# Patient Record
Sex: Female | Born: 1970
Health system: Southern US, Community
[De-identification: ages and names within clinical notes are randomized; demographics above are authoritative.]

## PROBLEM LIST (undated history)

## (undated) DIAGNOSIS — E785 Hyperlipidemia, unspecified: Secondary | ICD-10-CM

## (undated) DIAGNOSIS — M1990 Unspecified osteoarthritis, unspecified site: Secondary | ICD-10-CM

## (undated) DIAGNOSIS — I10 Essential (primary) hypertension: Principal | ICD-10-CM

## (undated) DIAGNOSIS — K219 Gastro-esophageal reflux disease without esophagitis: Secondary | ICD-10-CM

## (undated) DIAGNOSIS — D219 Benign neoplasm of connective and other soft tissue, unspecified: Secondary | ICD-10-CM

## (undated) DIAGNOSIS — N946 Dysmenorrhea, unspecified: Secondary | ICD-10-CM

## (undated) HISTORY — DX: Essential (primary) hypertension: I10

## (undated) HISTORY — DX: Benign neoplasm of connective and other soft tissue, unspecified: D21.9

## (undated) HISTORY — DX: Dysmenorrhea, unspecified: N94.6

## (undated) HISTORY — PX: MOUTH SURGERY: SHX715

## (undated) HISTORY — PX: NO PAST SURGERIES: SHX2092

## (undated) HISTORY — DX: Hyperlipidemia, unspecified: E78.5

---

## 2002-12-12 ENCOUNTER — Other Ambulatory Visit: Admission: RE | Admit: 2002-12-12 | Discharge: 2002-12-12 | Payer: Self-pay | Admitting: Internal Medicine

## 2003-12-27 ENCOUNTER — Other Ambulatory Visit: Admission: RE | Admit: 2003-12-27 | Discharge: 2003-12-27 | Payer: Self-pay | Admitting: Obstetrics and Gynecology

## 2005-04-13 ENCOUNTER — Ambulatory Visit: Payer: Self-pay | Admitting: Internal Medicine

## 2005-04-20 ENCOUNTER — Ambulatory Visit: Payer: Self-pay | Admitting: Internal Medicine

## 2005-04-20 ENCOUNTER — Other Ambulatory Visit: Admission: RE | Admit: 2005-04-20 | Discharge: 2005-04-20 | Payer: Self-pay | Admitting: Internal Medicine

## 2006-05-10 ENCOUNTER — Ambulatory Visit: Payer: Self-pay | Admitting: Internal Medicine

## 2006-08-09 ENCOUNTER — Ambulatory Visit: Payer: Self-pay | Admitting: Internal Medicine

## 2006-08-16 ENCOUNTER — Ambulatory Visit: Payer: Self-pay | Admitting: Internal Medicine

## 2006-08-16 ENCOUNTER — Other Ambulatory Visit: Admission: RE | Admit: 2006-08-16 | Discharge: 2006-08-16 | Payer: Self-pay | Admitting: Internal Medicine

## 2006-09-20 ENCOUNTER — Ambulatory Visit: Payer: Self-pay | Admitting: Internal Medicine

## 2007-05-03 ENCOUNTER — Inpatient Hospital Stay (HOSPITAL_COMMUNITY): Admission: AD | Admit: 2007-05-03 | Discharge: 2007-05-05 | Payer: Self-pay | Admitting: Obstetrics and Gynecology

## 2007-05-06 ENCOUNTER — Encounter: Admission: RE | Admit: 2007-05-06 | Discharge: 2007-06-05 | Payer: Self-pay | Admitting: Obstetrics and Gynecology

## 2007-06-06 ENCOUNTER — Encounter: Admission: RE | Admit: 2007-06-06 | Discharge: 2007-07-05 | Payer: Self-pay | Admitting: Obstetrics and Gynecology

## 2007-07-06 ENCOUNTER — Encounter: Admission: RE | Admit: 2007-07-06 | Discharge: 2007-08-05 | Payer: Self-pay | Admitting: Obstetrics and Gynecology

## 2007-08-06 ENCOUNTER — Encounter: Admission: RE | Admit: 2007-08-06 | Discharge: 2007-09-05 | Payer: Self-pay | Admitting: Obstetrics and Gynecology

## 2007-09-06 ENCOUNTER — Encounter: Admission: RE | Admit: 2007-09-06 | Discharge: 2007-10-05 | Payer: Self-pay | Admitting: Obstetrics and Gynecology

## 2007-10-06 ENCOUNTER — Encounter: Admission: RE | Admit: 2007-10-06 | Discharge: 2007-10-24 | Payer: Self-pay | Admitting: Obstetrics and Gynecology

## 2008-05-01 ENCOUNTER — Ambulatory Visit: Payer: Self-pay | Admitting: Internal Medicine

## 2008-05-01 DIAGNOSIS — R51 Headache: Secondary | ICD-10-CM

## 2008-05-01 DIAGNOSIS — E785 Hyperlipidemia, unspecified: Secondary | ICD-10-CM

## 2008-05-01 DIAGNOSIS — R519 Headache, unspecified: Secondary | ICD-10-CM | POA: Insufficient documentation

## 2008-05-01 DIAGNOSIS — G43909 Migraine, unspecified, not intractable, without status migrainosus: Secondary | ICD-10-CM | POA: Insufficient documentation

## 2008-05-01 DIAGNOSIS — S335XXA Sprain of ligaments of lumbar spine, initial encounter: Secondary | ICD-10-CM

## 2008-05-01 LAB — CONVERTED CEMR LAB
Nitrite: NEGATIVE
Specific Gravity, Urine: <1.005
WBC Urine, dipstick: NEGATIVE

## 2008-05-24 ENCOUNTER — Telehealth: Payer: Self-pay | Admitting: *Deleted

## 2008-05-27 ENCOUNTER — Telehealth: Payer: Self-pay | Admitting: *Deleted

## 2008-06-07 ENCOUNTER — Telehealth: Payer: Self-pay | Admitting: Internal Medicine

## 2008-09-20 ENCOUNTER — Telehealth: Payer: Self-pay | Admitting: Internal Medicine

## 2008-10-09 ENCOUNTER — Ambulatory Visit: Payer: Self-pay | Admitting: Internal Medicine

## 2008-10-09 LAB — CONVERTED CEMR LAB
ALT: 27 units/L (ref 0–35)
AST: 21 units/L (ref 0–37)
Alkaline Phosphatase: 93 units/L (ref 39–117)
Bilirubin Urine: NEGATIVE
Bilirubin, Direct: 0.1 mg/dL (ref 0.0–0.3)
CO2: 30 meq/L (ref 19–32)
Chloride: 106 meq/L (ref 96–112)
Glucose, Bld: 91 mg/dL (ref 70–99)
Hemoglobin: 12.9 g/dL (ref 12.0–15.0)
Leukocytes, UA: NEGATIVE
Lymphocytes Relative: 46 % (ref 12.0–46.0)
Monocytes Relative: 6.8 % (ref 3.0–12.0)
Neutro Abs: 2.5 10*3/uL (ref 1.4–7.7)
Nitrite: NEGATIVE
Platelets: 286 10*3/uL (ref 150–400)
Potassium: 3.9 meq/L (ref 3.5–5.1)
RDW: 13.6 % (ref 11.5–14.6)
Sodium: 141 meq/L (ref 135–145)
Total CHOL/HDL Ratio: 6
Total Protein: 7.9 g/dL (ref 6.0–8.3)
Triglycerides: 69 mg/dL (ref 0–149)
pH: 7 (ref 5.0–8.0)

## 2008-10-16 ENCOUNTER — Ambulatory Visit: Payer: Self-pay | Admitting: Internal Medicine

## 2008-10-16 ENCOUNTER — Encounter: Payer: Self-pay | Admitting: Internal Medicine

## 2008-10-16 ENCOUNTER — Other Ambulatory Visit: Admission: RE | Admit: 2008-10-16 | Discharge: 2008-10-16 | Payer: Self-pay | Admitting: Internal Medicine

## 2008-10-16 DIAGNOSIS — N912 Amenorrhea, unspecified: Secondary | ICD-10-CM

## 2009-02-11 ENCOUNTER — Ambulatory Visit: Payer: Self-pay | Admitting: Internal Medicine

## 2009-12-19 ENCOUNTER — Ambulatory Visit: Payer: Self-pay | Admitting: Internal Medicine

## 2009-12-19 LAB — CONVERTED CEMR LAB
ALT: 14 units/L (ref 0–35)
Albumin: 3.2 g/dL — ABNORMAL LOW (ref 3.5–5.2)
BUN: 6 mg/dL (ref 6–23)
Chloride: 107 meq/L (ref 96–112)
Cholesterol: 184 mg/dL (ref 0–200)
Eosinophils Relative: 2.1 % (ref 0.0–5.0)
Glucose, Bld: 78 mg/dL (ref 70–99)
HCT: 38.3 % (ref 36.0–46.0)
Hemoglobin: 12.4 g/dL (ref 12.0–15.0)
Ketones, urine, test strip: NEGATIVE
Lymphs Abs: 2.5 10*3/uL (ref 0.7–4.0)
MCV: 87.3 fL (ref 78.0–100.0)
Monocytes Absolute: 0.4 10*3/uL (ref 0.1–1.0)
Neutro Abs: 3 10*3/uL (ref 1.4–7.7)
Nitrite: NEGATIVE
Platelets: 286 10*3/uL (ref 150.0–400.0)
Potassium: 3.8 meq/L (ref 3.5–5.1)
RDW: 13.2 % (ref 11.5–14.6)
TSH: 1.04 microintl units/mL (ref 0.35–5.50)
Total Bilirubin: 0.6 mg/dL (ref 0.3–1.2)
Total Protein: 7.7 g/dL (ref 6.0–8.3)
Urobilinogen, UA: 0.2
VLDL: 22.6 mg/dL (ref 0.0–40.0)
WBC: 6.2 10*3/uL (ref 4.5–10.5)

## 2010-03-02 ENCOUNTER — Ambulatory Visit: Payer: Self-pay | Admitting: Internal Medicine

## 2010-03-02 ENCOUNTER — Other Ambulatory Visit: Admission: RE | Admit: 2010-03-02 | Discharge: 2010-03-02 | Payer: Self-pay | Admitting: Internal Medicine

## 2010-03-02 DIAGNOSIS — R030 Elevated blood-pressure reading, without diagnosis of hypertension: Secondary | ICD-10-CM

## 2010-03-02 LAB — CONVERTED CEMR LAB
Bilirubin Urine: NEGATIVE
Glucose, Urine, Semiquant: NEGATIVE
Ketones, urine, test strip: NEGATIVE
Protein, U semiquant: NEGATIVE
Urobilinogen, UA: 0.2
pH: 8

## 2010-03-09 LAB — CONVERTED CEMR LAB: Pap Smear: NEGATIVE

## 2010-04-27 ENCOUNTER — Ambulatory Visit: Payer: Self-pay | Admitting: Internal Medicine

## 2010-09-15 ENCOUNTER — Ambulatory Visit: Payer: Self-pay | Admitting: Internal Medicine

## 2010-09-18 ENCOUNTER — Telehealth: Payer: Self-pay | Admitting: Internal Medicine

## 2010-12-22 ENCOUNTER — Encounter
Admission: RE | Admit: 2010-12-22 | Discharge: 2010-12-22 | Payer: Self-pay | Source: Home / Self Care | Attending: Obstetrics and Gynecology | Admitting: Obstetrics and Gynecology

## 2010-12-29 ENCOUNTER — Telehealth: Payer: Self-pay | Admitting: *Deleted

## 2011-01-12 NOTE — Assessment & Plan Note (Signed)
Summary: 2 MONTH FUP//CCM   Vital Signs:  Patient profile:   40 year old female Menstrual status:  regular Height:      64.5 inches Weight:      194 pounds BMI:     32.90 Temp:     98.4 degrees F oral Pulse rate:   74 / minute BP sitting:   152 / 90  (right arm)  Vitals Entered By: Kathrynn Speed CMA (Apr 27, 2010 11:25 AM) CC: ROV/ BP, Hypertension Management   History of Present Illness: Teresa Hickman comes in comes in today  for follow up of BP and HAs. Since last time her BP has been up and down  Max 148/90 low 133/82  No cv signs . HAs  calendare for past month  shows about 3 days around eginning of menses.   and moderated  with some releif with nsaid . Further hx of HA characterization she has had hx of  migraine with aura and classic migraine . DOing ok today.    Hypertension History:      She complains of chest pain, but denies headache, palpitations, dyspnea with exertion, orthopnea, PND, peripheral edema, visual symptoms, neurologic problems, syncope, and side effects from treatment.  She notes no problems with any antihypertensive medication side effects.        Positive major cardiovascular risk factors include hyperlipidemia.  Negative major cardiovascular risk factors include female age less than 2 years old and non-tobacco-user status.     Preventive Screening-Counseling & Management  Alcohol-Tobacco     Alcohol drinks/day: 0     Smoking Status: never  Caffeine-Diet-Exercise     Caffeine use/day: 1     Does Patient Exercise: yes  Current Medications (verified): 1)  Nortrel 0.5/35 (28) 0.5-35 Mg-Mcg Tabs (Norethindrone-Eth Estradiol) .Marland Kitchen.. 1 By Mouth Once Daily  Allergies (verified): No Known Drug Allergies  Past History:  Past medical, surgical, family and social histories (including risk factors) reviewed for relevance to current acute and chronic problems.  Past Medical History: Reviewed history from 02/11/2009 and no changes  required. g4P4 Headache Hyperlipidemia       LAST Mammogram: n/a Pap: 2007 Td: 08/16/06  Consults   None  Past Surgical History: Reviewed history from 05/01/2008 and no changes required. G3 P3  Past History:  Care Management: None Current  Family History: Reviewed history from 03/02/2010 and no changes required. Family History Hypertension mom  died    in [place].  6os  SD.  2010 Family History of Stroke F 1st degree relativemom Father: well Mother:  type 2 dm   Siblings:   Social History: Reviewed history from 03/02/2010 and no changes required. non smoker  from [place] medical family HH of 6  no pets        Review of Systems       no cv pulm signs  no vision changes  Physical Exam  General:  Well-developed,well-nourished,in no acute distress; alert,appropriate and cooperative throughout examination Head:  normocephalic and atraumatic.   Lungs:  normal respiratory effort and no intercostal retractions.   Heart:  normal rate and regular rhythm.   Neurologic:  grossly non focal  Psych:  Oriented X3, normally interactive, good eye contact, not anxious appearing, and not depressed appearing.   Additional Exam:  reviewed records log BP and HA    Impression & Recommendations:  Problem # 1:  ELEVATED BLOOD PRESSURE WITHOUT DIAGNOSIS OF HYPERTENSION (ICD-796.2) about 50 %  of readings have some elevation mild ...doesnt seem  related to HA but  is on combined OCPS  and this can contribute to effect.  NO meds for now but will need to stop the ocps.  and then follow up   Problem # 2:  MIGRAINE HEADACHE (ICD-346.90) Hx of classic  migarine with aura .   HA seem to be menstrually    related .    Discussed risk benefit     of ocp and rec  DC combined pills  because of increase risk of stroke  with this hx.    age and bp issues .  Pt  would consider an IUD  and I think this is a good option  .    Will arrange her own appt and will send note to this person.   Problem # 3:   CONTRACEPTIVE MANAGEMENT (ICD-V25.09) see above    rec dc ocps.   Complete Medication List: 1)  Nortrel 0.5/35 (28) 0.5-35 Mg-mcg Tabs (Norethindrone-eth estradiol) .Marland Kitchen.. 1 by mouth once daily  Hypertension Assessment/Plan:      The patient's hypertensive risk group is category B: At least one risk factor (excluding diabetes) with no target organ damage.  Her calculated 10 year risk of coronary heart disease is 3 %.  Today's blood pressure is 152/90.    Patient Instructions: 1)  call   with  appt    for poss IUD placement .    2)  Going off  combined oral contraceptives because of  elevated Blood pressure readings and hx of some aura with Headaches. 3)  Continue to track your HAs  and BPressure readings     4)  (no meds for ) 5)  After stopping   medication   return office visit after 1-2 months so we can review your BP readings and HA calendar . She will call if BP staying high   in the meantime.

## 2011-01-12 NOTE — Progress Notes (Signed)
Summary: Pt needs IUD at Dr. Kittie Plater office.  ---- Converted from flag ---- ---- 09/16/2010 11:48 AM, Madelin Headings MD wrote: please call or have someone call Dr Thurmon Fair office  and  arrange for her to be seen  for poss mirena iud  . she shouldnt be taking  combined ocps with her HA issues   but i put her on progersterone only pill temporarily . this got confused last time .  fax went to dr Vincente Poli instead of dr Henderson Cloud.  thanks ------------------------------   I called Dr. Kittie Plater office to set up an appt for pt to get a IUD. They are going to have to call me back about this. Romualdo Bolk, CMA (AAMA)  September 18, 2010 10:12 AM [phone] at Dr. Cline Cools to [place] about this. She states that she will call pt to schedule an appt. I have call pt and discussed this with her. Romualdo Bolk, CMA (AAMA)  September 18, 2010 11:57 AM thank you Madelin Headings MD  September 18, 2010 1:20 PM

## 2011-01-12 NOTE — Assessment & Plan Note (Signed)
Summary: cpx/? pap/njr per shannon/njr   Vital Signs:  Patient profile:   40 year old female Menstrual status:  regular LMP:     02/27/2010 Height:      64.5 inches Weight:      193 pounds BMI:     32.73 Pulse rate:   78 / minute BP sitting:   140 / 84  (left arm) Cuff size:   regular  Vitals Entered By: Romualdo Bolk, CMA Duncan Dull) (March 02, 2010 10:17 AM)  Nutrition Counseling: Patient's BMI is greater than 25 and therefore counseled on weight management options. CC: CPX discuss doing a pap LMP (date): 02/27/2010 LMP - Character: normal Menarche (age onset years): 14   Menses interval (days): 28 Menstrual flow (days): 3-4 Menstrual Status regular Enter LMP: 02/27/2010 Last PAP Result NEGATIVE FOR INTRAEPITHELIAL LESIONS OR MALIGNANCY.   History of Present Illness: Teresa Hickman comesin  today  for preventive visit .  Gets migraines.     taking ibuprofen.   somewhat more frequently recently . NO vision changes fever numbness or weakness.  Unclear how often  needs to take meds.  OCPS doing well for her  periods.   No other new med problems . No BP checks done.  Some increasing weight over the year.   Preventive Care Screening  Last Tetanus Booster:    Date:  08/16/2006    Results:  Tdap   Prior Values:    Pap Smear:  NEGATIVE FOR INTRAEPITHELIAL LESIONS OR MALIGNANCY. (10/16/2008)   Preventive Screening-Counseling & Management  Alcohol-Tobacco     Alcohol drinks/day: 0     Smoking Status: never  Caffeine-Diet-Exercise     Caffeine use/day: 1     Does Patient Exercise: yes  Hep-HIV-STD-Contraception     Dental Visit-last 6 months yes  Safety-Violence-Falls     Seat Belt Use: yes     Firearms in the Home: no firearms in the home     Smoke Detectors: yes      Blood Transfusions:  no.        Travel History:  [place].    Current Medications (verified): 1)  Nortrel 0.5/35 (28) 0.5-35 Mg-Mcg Tabs (Norethindrone-Eth Estradiol) .Marland Kitchen.. 1 By Mouth Once  Daily  Allergies (verified): No Known Drug Allergies  Past History:  Past medical, surgical, family and social histories (including risk factors) reviewed, and no changes noted (except as noted below).  Past Medical History: Reviewed history from 02/11/2009 and no changes required. g4P4 Headache Hyperlipidemia       LAST Mammogram: n/a Pap: 2007 Td: 08/16/06  Consults   None  Past Surgical History: Reviewed history from 05/01/2008 and no changes required. G3 P3  Past History:  Care Management: None Current  Family History: Reviewed history from 10/16/2008 and no changes required. Family History Hypertension mom  died    in [place].  6os  SD.  2010 Family History of Stroke F 1st degree relativemom Father: well Mother:  type 2 dm   Siblings:   Social History: Reviewed history from 02/11/2009 and no changes required. non smoker  from [place] medical family HH of 6  no pets      Smoking Status:  never Caffeine use/day:  1 Does Patient Exercise:  yes Seat Belt Use:  yes Dental Care w/in 6 mos.:  yes Blood Transfusions:  no  Review of Systems       The patient complains of headaches.  The patient denies anorexia, fever, weight loss, weight gain, vision loss, hoarseness, chest  pain, syncope, dyspnea on exertion, peripheral edema, prolonged cough, hemoptysis, abdominal pain, melena, hematochezia, severe indigestion/heartburn, hematuria, muscle weakness, suspicious skin lesions, transient blindness, difficulty walking, unusual weight change, abnormal bleeding, enlarged lymph nodes, angioedema, and breast masses.         left  hearing a problem.  after a cold.  Physical Exam General Appearance: well developed, well nourished, no acute distress Eyes: conjunctiva and lids normal, PERRLA, EOMI, WNL Ears, Nose, Mouth, Throat: TM clear, nares clear, oral exam WNL Neck: supple, no lymphadenopathy, no thyromegaly, no JVD Respiratory: clear to auscultation and percussion,  respiratory effort normal Cardiovascular: regular rate and rhythm, S1-S2, no murmur, rub or gallop, no bruits, peripheral pulses normal and symmetric, no cyanosis, clubbing, edema or varicosities Chest: no scars, masses, tenderness; no asymmetry, skin changes, nipple discharge   Gastrointestinal: soft, non-tender; no hepatosplenomegaly, masses; active bowel sounds all quadrants, guaiac negative stool; no masses, tenderness, hemorrhoids  Genitourinary: no vaginal discharge, lesions; no masses or tenderness Lymphatic: no cervical, axillary or inguinal adenopathy  PAP done Musculoskeletal: gait normal, muscle tone and strength WNL, no joint swelling, effusions, discoloration, crepitus  Skin: clear, good turgor, color WNL, no rashes, lesions, or ulcerations Neurologic: normal mental status, normal reflexes, normal strength, sensation, and motion Psychiatric: alert; oriented to person, place and time Other Exam:     Impression & Recommendations:  Problem # 1:  HEALTH MAINTENANCE EXAM, ADULT (ICD-V70.0) disc strategies for weigh t loss.   Problem # 2:  ROUTINE GYNECOLOGICAL EXAM (ICD-V72.31)  nl exam   Orders: Pap Smear, Thin Prep ( Collection of) ([id number])  Problem # 3:  HEADACHE (ICD-784.0) counseled and consider calendaring to prevent chronic ha and rebound  Problem # 4:  ELEVATED BLOOD PRESSURE WITHOUT DIAGNOSIS OF HYPERTENSION (ICD-796.2) Assessment: New with family hx    should follow  and currently monitor and lifestyle intervention   Problem # 5:  CONTRACEPTIVE MANAGEMENT (ICD-V25.09) monitor with BP and has  had been doing well  .  Complete Medication List: 1)  Nortrel 0.5/35 (28) 0.5-35 Mg-mcg Tabs (Norethindrone-eth estradiol) .Marland Kitchen.. 1 by mouth once daily  Patient Instructions: 1)  Do HA calendar.   over the next 2 months and then ROV. 2)  Check BP readings  at least 3 x per week  and bring results  to office .  and  return office visit .    3)  If you are not having to  take medication for headaches more than 4-5 x per month and your BP readings are below 140/90  you can  mail or fax in your readings and HA calendar  so I can review this . I Prescriptions: NORTREL 0.5/35 (28) 0.5-35 MG-MCG TABS (NORETHINDRONE-ETH ESTRADIOL) 1 by mouth once daily  #28 Tablet x 6   Entered and Authorized by:   Madelin Headings MD   Signed by:   Madelin Headings MD on 03/02/2010   Method used:   Electronically to        [facility] [address] [phone]* (retail)       [address]       [place], [place]  [address]       Ph: [phone] or [phone]       Fax: [phone]   RxID:   [id number]     Laboratory Results   Urine Tests  Date/Time Received: March 02, 2010 10:48 AM   Routine Urinalysis   Color: yellow  Appearance: Clear Glucose: negative   (Normal Range: Negative) Bilirubin: negative   (Normal Range: Negative) Ketone: negative   (Normal Range: Negative) Spec. Gravity: <1.005   (Normal Range: 1.003-1.035) Blood: trace-lysed   (Normal Range: Negative) pH: 8.0   (Normal Range: 5.0-8.0) Protein: negative   (Normal Range: Negative) Urobilinogen: 0.2   (Normal Range: 0-1) Nitrite: negative   (Normal Range: Negative) Leukocyte Esterace: trace   (Normal Range: Negative)    Comments: Romualdo Bolk, CMA (AAMA)  March 02, 2010 10:48 AM

## 2011-01-12 NOTE — Assessment & Plan Note (Signed)
Summary: MIGRAINE HEADACHE/CJR   Vital Signs:  Patient profile:   40 year old female Menstrual status:  regular LMP:     08/17/2010 Weight:      188 pounds Temp:     98.9 degrees F oral Pulse rate:   72 / minute BP sitting:   130 / 80  (left arm) Cuff size:   regular  Vitals Entered By: Romualdo Bolk, CMA (AAMA) (September 15, 2010 9:49 AM) CC: Migraine Ha for 2-3 days. Pt is having some vomiting, senstive to both light and sound. PT states that she had a ha on 10/1 and lasted for 3 days. No ha today. Pt states that pharmacy changed manufactures and she thinks this is what is causing her ha's. Pt has been on the new pack for 2 months. Pt states that the ha started when the pharmacy changed her ocp's. LMP (date): 08/17/2010 LMP - Character: normal Menarche (age onset years): 14   Menses interval (days): 28 Menstrual flow (days): 3-4 Enter LMP: 08/17/2010 Last PAP Result NEGATIVE FOR INTRAEPITHELIAL LESIONS OR MALIGNANCY. BENIGN REACTIVE/REPARATIVE CHANGES.   History of Present Illness: Trace Cederberg   comes in today   for MHAs .   Thinks the ha related to change in generic med and more frequently   for 2 month 2-4 x per month . See last note in June and plan was to  get iud  but  Had gone to her gyne as we directed but they doidnt get her an IUD in the summer and gave her something else apparentlydidnt know about our plan.  In the meamtine   continued with ocps and then genereic changes and now getting   2-3 has per month with some aura and nausea and vomiting doing ok now.   Preventive Screening-Counseling & Management  Alcohol-Tobacco     Alcohol drinks/day: 0     Smoking Status: never  Caffeine-Diet-Exercise     Caffeine use/day: 1     Does Patient Exercise: yes  Current Medications (verified): 1)  Nortrel 0.5/35 (28) 0.5-35 Mg-Mcg Tabs (Norethindrone-Eth Estradiol) .Marland Kitchen.. 1 By Mouth Once Daily  Allergies (verified): No Known Drug Allergies  Past History:  Past  medical, surgical, family and social histories (including risk factors) reviewed, and no changes noted (except as noted below).  Past Medical History: g4P4 Headache  migraine some with aura  Hyperlipidemia       LAST Mammogram: n/a Pap: 2007 Td: 08/16/06  Consults   None  Past Surgical History: Reviewed history from 05/01/2008 and no changes required. G3 P3  Past History:  Care Management: None Current  Family History: Reviewed history from 03/02/2010 and no changes required. Family History Hypertension mom  died    in [place].  6os  SD.  2010 Family History of Stroke F 1st degree relativemom Father: well Mother:  type 2 dm   Siblings:   Social History: Reviewed history from 03/02/2010 and no changes required. non smoker  from [place] medical family HH of 6  no pets        Review of Systems  The patient denies anorexia, fever, weight loss, weight gain, vision loss, decreased hearing, hoarseness, chest pain, syncope, hemoptysis, abdominal pain, muscle weakness, transient blindness, difficulty walking, abnormal bleeding, enlarged lymph nodes, and angioedema.    Physical Exam  General:  Well-developed,well-nourished,in no acute distress; alert,appropriate and cooperative throughout examination Head:  normocephalic and atraumatic.   Eyes:  PERRL, EOMs full, conjunctiva clear  Ears:  R ear  normal, L ear normal, and no external deformities.   Nose:  no external deformity and no nasal discharge.   Mouth:  good dentition and pharynx pink and moist.   Lungs:  Normal respiratory effort, chest expands symmetrically. Lungs are clear to auscultation, no crackles or wheezes. Heart:  Normal rate and regular rhythm. S1 and S2 normal without gallop, murmur, click, rub or other extra sounds. Pulses:  pulses intact without delay   nl cap refill  Neurologic:  alert & oriented X3, cranial nerves IIi-XII intact, strength normal in all extremities, gait normal, and DTRs symmetrical and  normal.   Skin:  turgor normal, color normal, no ecchymoses, and no petechiae.   Cervical Nodes:  No lymphadenopathy noted Psych:  Oriented X3, normally interactive, good eye contact, not anxious appearing, and not depressed appearing.     Impression & Recommendations:  Problem # 1:  MIGRAINE HEADACHE (ICD-346.90) see last note     confusion about referral and didnt get iud  and instead continuing with   combined meds.  bp has been high normal  not elevated like before.,      She shouldnt be on combined pills and  we will contact Dr Thurmon Fair office about  referral  as  previous arrangements  fell through .     Patinet willing to have IUD .   change to minipill in the meantime.   Her updated medication list for this problem includes:    Ibuprofen 800 Mg Tabs (Ibuprofen) .Marland Kitchen... 1 by mouth q6 hour s as needed headache  Problem # 2:  CONTRACEPTIVE MANAGEMENT (ICD-V25.09)  Problem # 3:  ELEVATED BLOOD PRESSURE WITHOUT DIAGNOSIS OF HYPERTENSION (ICD-796.2) better today  but hogih normal   Complete Medication List: 1)  Nortrel 0.5/35 (28) 0.5-35 Mg-mcg Tabs (Norethindrone-eth estradiol) .Marland Kitchen.. 1 by mouth once daily 2)  Ibuprofen 800 Mg Tabs (Ibuprofen) .Marland Kitchen.. 1 by mouth q6 hour s as needed headache 3)  Ortho Micronor 0.35 Mg Tabs (Norethindrone) .Marland Kitchen.. 1 by mouth once daily  Contraindications/Deferment of Procedures/Staging:    Test/Procedure: FLU VAX    Reason for deferment: patient declined   Patient Instructions: 1)  I still think  you should not be taking the combined hormone pill. so stop the current pill.  2)  Change to progesterone only pill for now  3)  we will contact Dr Henderson Cloud office   about   IUD  option.   4)  This should be safer and a good option 5)  Can use  Ibuprofen 800 at onset of ha in the meantime. Prescriptions: ORTHO MICRONOR 0.35 MG TABS (NORETHINDRONE) 1 by mouth once daily  #1 pack x 1   Entered and Authorized by:   Madelin Headings MD   Signed by:   Madelin Headings MD  on 09/15/2010   Method used:   Print then Give to Patient   RxID:   [id number] IBUPROFEN 800 MG TABS (IBUPROFEN) 1 by mouth q6 hour s as needed headache  #40 x 1   Entered and Authorized by:   Madelin Headings MD   Signed by:   Madelin Headings MD on 09/15/2010   Method used:   Print then Give to Patient   RxID:   [id number]

## 2011-01-14 NOTE — Progress Notes (Signed)
Summary: Pt req refills for the new birth control pill she was prescribed  Phone Note Call from Patient Call back at Home Phone [phone]   Summary of Call: Pt called and is needing refill of new birth control med. Pls call in to [facility] on [address]    Pt completely out of med.  Initial call taken by: Lucy Antigua,  December 29, 2010 3:50 PM  Follow-up for Phone Call        Spoke to pt and she has seen the GYN and they are going to put the IUD in next month. I told pt that we would refill it this time then she needs to let us know if she doesn't get the IUD. Follow-up by: Romualdo Bolk, CMA (AAMA),  December 29, 2010 4:14 PM    Prescriptions: ORTHO MICRONOR 0.35 MG TABS (NORETHINDRONE) 1 by mouth once daily  #1 pack x 0   Entered by:   Romualdo Bolk, CMA (AAMA)   Authorized by:   Madelin Headings MD   Signed by:   Romualdo Bolk, CMA (AAMA) on 12/29/2010   Method used:   Electronically to        [facility] [address] [phone]* (retail)       [address]       [place], [place]  [address]       Ph: [phone] or [phone]       Fax: [phone]   RxID:   [id number]

## 2011-01-18 ENCOUNTER — Telehealth: Payer: Self-pay | Admitting: Internal Medicine

## 2011-01-18 NOTE — Telephone Encounter (Signed)
Pt would like ov for headaches asap. Can I work pt in?

## 2011-01-19 ENCOUNTER — Telehealth: Payer: Self-pay | Admitting: *Deleted

## 2011-01-19 MED ORDER — NORETHINDRONE 0.35 MG PO TABS: 1.0000 | ORAL_TABLET | Freq: Every day | ORAL | Status: DC

## 2011-01-19 NOTE — Telephone Encounter (Signed)
Spoke to pt and she is still waiting to get the IUD. I told pt that we would call in her OCP's for 2 more months but then she needs to let us know when she is going to get the IUD.

## 2011-01-20 ENCOUNTER — Ambulatory Visit: Payer: Self-pay | Admitting: Internal Medicine

## 2011-03-05 NOTE — Telephone Encounter (Signed)
Pt had ov sch for 2-8

## 2011-03-17 ENCOUNTER — Encounter: Payer: Self-pay | Admitting: Internal Medicine

## 2011-03-18 ENCOUNTER — Encounter: Payer: Self-pay | Admitting: Internal Medicine

## 2011-03-18 ENCOUNTER — Ambulatory Visit (INDEPENDENT_AMBULATORY_CARE_PROVIDER_SITE_OTHER): Payer: 59 | Admitting: Internal Medicine

## 2011-03-18 VITALS — BP 120/80 | HR 78 | Ht 64.5 in | Wt 194.0 lb

## 2011-03-18 DIAGNOSIS — R030 Elevated blood-pressure reading, without diagnosis of hypertension: Secondary | ICD-10-CM

## 2011-03-18 DIAGNOSIS — N926 Irregular menstruation, unspecified: Secondary | ICD-10-CM | POA: Insufficient documentation

## 2011-03-18 DIAGNOSIS — Z309 Encounter for contraceptive management, unspecified: Secondary | ICD-10-CM | POA: Insufficient documentation

## 2011-03-18 DIAGNOSIS — G43909 Migraine, unspecified, not intractable, without status migrainosus: Secondary | ICD-10-CM

## 2011-03-18 MED ORDER — NORETHINDRONE 0.35 MG PO TABS: 1.0000 | ORAL_TABLET | Freq: Every day | ORAL | Status: DC

## 2011-03-18 NOTE — Assessment & Plan Note (Signed)
Currently in frequent and rare

## 2011-03-18 NOTE — Patient Instructions (Addendum)
Sign release to ge copy of  Your gyne check and pap.   The irregular  menses you are having  Is   Somewhat common with the  Progesterone only pill. IF you take it on time however should be pretty good for contraception. Still consider   Depo provera   And progesterone implants and of course the iud.  The reason we are avoiding the estrogen is because of  your hisitory of classic migraine and loss of vision with your migraines.  Estrogen puts you at risk of stroke in this situation.  You also had elevation of Blood pressure at that time.

## 2011-03-18 NOTE — Progress Notes (Signed)
  Subjective:    Patient ID: Teresa Hickman, female    DOB: 05/07/1971, 40 y.o.   MRN: [id number]  HPI Patient comes in today for appointment to discuss contraception birth control method. She was considering NIDDM did go to the OB/GYN and had a normal Pap and apparently normal exam and mammogram. She is having some irregular bleeding on the progesterone only daily pills but takes them very regularly sometimes she skips a period and this causes her some concern. Home pregnancy test negative sometimes her bleeding is heavy. The GYN told her that this could just be normal for this pill. It does not appear that she needed to have any kind of diagnostic procedure.  She wants to discuss any other options.   Review of Systems Negative for chest pain shortness of breath her migraine headaches her minimal now really gets them her blood pressure has been good.  Past Medical History  Diagnosis Date  . Migraine     with some aura  . Hyperlipidemia    History reviewed. No pertinent past surgical history.  reports that she has never smoked. She does not have any smokeless tobacco history on file. She reports that she does not drink alcohol or use illicit drugs. family history includes Diabetes in her mother; Hypertension in her mother; and Stroke in her mother. No Known Allergies     Objective:   Physical Exam Well-developed well-nourished in no acute distress   record review in the previous electronic record and the current one.       Assessment & Plan:  Irregular bleeding most likely from the progesterone only contraception  Expected management options such as the Depo-Provera or the progesterone implants as opposed to the IUD.  Review the chart and would recommend against estrogen pills because of her visual changes with migraines and the increased risk of stroke. Her mom did have a stroke in her life.   Unfortunately because of her age contraceptives efficacy may be pretty good. She can  consider these other options at some point in the future. We'll have her sign a release to get a copy of the gynecologic evaluation.  Total visit > 50% spent counseling and coordinating care

## 2011-08-23 ENCOUNTER — Encounter: Payer: Self-pay | Admitting: Internal Medicine

## 2011-08-23 ENCOUNTER — Ambulatory Visit (INDEPENDENT_AMBULATORY_CARE_PROVIDER_SITE_OTHER): Payer: 59 | Admitting: Internal Medicine

## 2011-08-23 DIAGNOSIS — R51 Headache: Secondary | ICD-10-CM

## 2011-08-23 DIAGNOSIS — N926 Irregular menstruation, unspecified: Secondary | ICD-10-CM

## 2011-08-23 DIAGNOSIS — R109 Unspecified abdominal pain: Secondary | ICD-10-CM

## 2011-08-23 DIAGNOSIS — G43909 Migraine, unspecified, not intractable, without status migrainosus: Secondary | ICD-10-CM

## 2011-08-23 DIAGNOSIS — R030 Elevated blood-pressure reading, without diagnosis of hypertension: Secondary | ICD-10-CM

## 2011-08-23 DIAGNOSIS — Z309 Encounter for contraceptive management, unspecified: Secondary | ICD-10-CM

## 2011-08-23 LAB — POCT URINALYSIS DIPSTICK
Glucose, UA: NEGATIVE
Ketones, UA: NEGATIVE
Leukocytes, UA: NEGATIVE
Spec Grav, UA: 1.025
Urobilinogen, UA: 0.2

## 2011-08-23 LAB — POCT URINE PREGNANCY: Preg Test, Ur: NEGATIVE

## 2011-08-23 NOTE — Progress Notes (Signed)
  Subjective:    Patient ID: Teresa Hickman, female    DOB: 10/11/1971, 41 y.o.   MRN: [id number]  HPI  Patient comes in today for SDA  For acute problem evaluation. However ongoing  Also. Sh has  Developed  Right low back pain   About   7/10 worse at night.  Sometime pain down legs.  Now has 10 day period.   Ongoing.  Dropping in toilet.    It is possible that back pain related. Just came  Back  from [place]  3 weeks ago .   NO known missed pills but could have been off cause fo travel.   Asks about other options contraception of another pill .   No more sig headaches off combined ocps on progesterone only.    ? What happened about getting the mirena   Communication problem with gyne office.  Review of Systems No fever cough cp sob  Other bleeding  abd pain NVD   Past history family history social history reviewed in the electronic medical record.     Objective:   Physical Exam WDWN in nad  Nl gait  Neck supple  Back    R/  si area pain  Nl gait and no obv gross motor weakness No clubbing cyanosis or edema Neuro oreinted nl station and gait  No obv focal weakness      Assessment & Plan:  LB pain  With hx of same   Sound mechanical but could be related to menses.  Prolonged menses  On  prog only pill  ? If taking late when she traveled internationally. Pt is some with frustrated with situation and I reviewed again options possible  Would like to avoid estrogen cause of one episode of sig visual auro and migraine and also some elevation of bp.  Rik of stroke with estrogen  There is a 10 mcg pill but  Still has a risk and cant guarantee bleeding pattern or se.   Still think the iud a good option.   She will talk with her husband a physician before proceeding.  Total visit > 50% spent counseling and coordinating care

## 2011-08-23 NOTE — Patient Instructions (Signed)
Ibuprofen ok for the  Back  Pain and hot pad.   If the back pain is  persistent or progressive . Call for  Reevaluation.  It is possible that the prolonged period could be from the pills  Taken with some hours of fluctuation.  I am hesitant to have you take estrogen pills because of the increase rsij of stroke in persons who have visual migraines. Other options are MIrena IUD and depoprovera.  There is a low dose Loestrin 10 mg that could be tried with caution  But may not make your periods regular and still has  Increase stroke risk.    Call if you wish to do this instead with health risk caveats as above.

## 2012-05-04 ENCOUNTER — Telehealth: Payer: Self-pay | Admitting: Internal Medicine

## 2012-05-04 NOTE — Telephone Encounter (Signed)
Pls advise.  

## 2012-05-04 NOTE — Telephone Encounter (Signed)
Pt is traveling out of the country and would like to have a physical before June 15th and would like to have  Her daughter Desere Gwin establish and have a physical before June 15th as well. Please advise

## 2012-05-04 NOTE — Telephone Encounter (Signed)
June 11th appt  tues 145  Is a 30 minute for cpx . We can see her daughter same day  85 and 25  but must make sure the first appt  Is not late  And paper work filled out to expedite before the visit for her daughter. Any immunizations  Records are helpful.

## 2012-05-05 NOTE — Telephone Encounter (Signed)
Pls schedule

## 2012-05-05 NOTE — Telephone Encounter (Signed)
Tried contacting several times pt has no voicemail. Will try again at a later date.   * slots held at this time

## 2012-05-09 NOTE — Telephone Encounter (Signed)
Tried calling pt, still no answer and no voicemail. No additional phone number in chart

## 2012-05-10 NOTE — Telephone Encounter (Signed)
Failed to reach pt, mailed letter asking pt to contact us

## 2012-05-23 ENCOUNTER — Encounter: Payer: 59 | Admitting: Internal Medicine

## 2012-06-14 ENCOUNTER — Encounter: Payer: 59 | Admitting: Internal Medicine

## 2012-09-22 ENCOUNTER — Encounter: Payer: Self-pay | Admitting: Internal Medicine

## 2012-09-22 ENCOUNTER — Ambulatory Visit (INDEPENDENT_AMBULATORY_CARE_PROVIDER_SITE_OTHER): Payer: 59 | Admitting: Internal Medicine

## 2012-09-22 VITALS — BP 110/80 | HR 86 | Temp 98.2°F | Wt 195.0 lb

## 2012-09-22 DIAGNOSIS — M25561 Pain in right knee: Secondary | ICD-10-CM

## 2012-09-22 DIAGNOSIS — Z309 Encounter for contraceptive management, unspecified: Secondary | ICD-10-CM

## 2012-09-22 DIAGNOSIS — M25569 Pain in unspecified knee: Secondary | ICD-10-CM

## 2012-09-22 DIAGNOSIS — N926 Irregular menstruation, unspecified: Secondary | ICD-10-CM

## 2012-09-22 DIAGNOSIS — M545 Low back pain: Secondary | ICD-10-CM

## 2012-09-22 NOTE — Patient Instructions (Signed)
Your knee pain appears to be consistent with a traumatic bursitis or related to having to be underneath a lot.  Back pain appears to be mechanical and also related. Losing weight will help both of these situations.  I would ice after kneeling gets a very good padding such as gardening phone and even kneepads if needed.  Can try knee and back exercises to maintain hygiene and ibuprofen. If this is persistent or progressive we can consider physical therapy and/or get you to see a specialist.  There are no good answers about hormonal contraception.  Still think an IUD is a good choice however.  We can try pills again  We'll have to review your chart  I don't have the gynecology records and recommendations.  May need another consult.  Back Exercises These exercises may help you when beginning to rehabilitate your injury. Your symptoms may resolve with or without further involvement from your physician, physical therapist or athletic trainer. While completing these exercises, remember:   Restoring tissue flexibility helps normal motion to return to the joints. This allows healthier, less painful movement and activity.  An effective stretch should be held for at least 30 seconds.  A stretch should never be painful. You should only feel a gentle lengthening or release in the stretched tissue. STRETCH  Extension, Prone on Elbows   Lie on your stomach on the floor, a bed will be too soft. Place your palms about shoulder width apart and at the height of your head.  Place your elbows under your shoulders. If this is too painful, stack pillows under your chest.  Allow your body to relax so that your hips drop lower and make contact more completely with the floor.  Hold this position for __________ seconds.  Slowly return to lying flat on the floor. Repeat __________ times. Complete this exercise __________ times per day.  RANGE OF MOTION  Extension, Prone Press Ups   Lie on your stomach on the  floor, a bed will be too soft. Place your palms about shoulder width apart and at the height of your head.  Keeping your back as relaxed as possible, slowly straighten your elbows while keeping your hips on the floor. You may adjust the placement of your hands to maximize your comfort. As you gain motion, your hands will come more underneath your shoulders.  Hold this position __________ seconds.  Slowly return to lying flat on the floor. Repeat __________ times. Complete this exercise __________ times per day.  RANGE OF MOTION- Quadruped, Neutral Spine   Assume a hands and knees position on a firm surface. Keep your hands under your shoulders and your knees under your hips. You may place padding under your knees for comfort.  Drop your head and point your tail bone toward the ground below you. This will round out your low back like an angry cat. Hold this position for __________ seconds.  Slowly lift your head and release your tail bone so that your back sags into a large arch, like an old horse.  Hold this position for __________ seconds.  Repeat this until you feel limber in your low back.  Now, find your "sweet spot." This will be the most comfortable position somewhere between the two previous positions. This is your neutral spine. Once you have found this position, tense your stomach muscles to support your low back.  Hold this position for __________ seconds. Repeat __________ times. Complete this exercise __________ times per day.  STRETCH  Flexion,  Single Knee to Chest   Lie on a firm bed or floor with both legs extended in front of you.  Keeping one leg in contact with the floor, bring your opposite knee to your chest. Hold your leg in place by either grabbing behind your thigh or at your knee.  Pull until you feel a gentle stretch in your low back. Hold __________ seconds.  Slowly release your grasp and repeat the exercise with the opposite side. Repeat __________ times.  Complete this exercise __________ times per day.  STRETCH - Hamstrings, Standing  Stand or sit and extend your right / left leg, placing your foot on a chair or foot stool  Keeping a slight arch in your low back and your hips straight forward.  Lead with your chest and lean forward at the waist until you feel a gentle stretch in the back of your right / left knee or thigh. (When done correctly, this exercise requires leaning only a small distance.)  Hold this position for __________ seconds. Repeat __________ times. Complete this stretch __________ times per day. STRENGTHENING  Deep Abdominals, Pelvic Tilt   Lie on a firm bed or floor. Keeping your legs in front of you, bend your knees so they are both pointed toward the ceiling and your feet are flat on the floor.  Tense your lower abdominal muscles to press your low back into the floor. This motion will rotate your pelvis so that your tail bone is scooping upwards rather than pointing at your feet or into the floor.  With a gentle tension and even breathing, hold this position for __________ seconds. Repeat __________ times. Complete this exercise __________ times per day.  STRENGTHENING  Abdominals, Crunches   Lie on a firm bed or floor. Keeping your legs in front of you, bend your knees so they are both pointed toward the ceiling and your feet are flat on the floor. Cross your arms over your chest.  Slightly tip your chin down without bending your neck.  Tense your abdominals and slowly lift your trunk high enough to just clear your shoulder blades. Lifting higher can put excessive stress on the low back and does not further strengthen your abdominal muscles.  Control your return to the starting position. Repeat __________ times. Complete this exercise __________ times per day.  STRENGTHENING  Quadruped, Opposite UE/LE Lift   Assume a hands and knees position on a firm surface. Keep your hands under your shoulders and your knees  under your hips. You may place padding under your knees for comfort.  Find your neutral spine and gently tense your abdominal muscles so that you can maintain this position. Your shoulders and hips should form a rectangle that is parallel with the floor and is not twisted.  Keeping your trunk steady, lift your right hand no higher than your shoulder and then your left leg no higher than your hip. Make sure you are not holding your breath. Hold this position __________ seconds.  Continuing to keep your abdominal muscles tense and your back steady, slowly return to your starting position. Repeat with the opposite arm and leg. Repeat __________ times. Complete this exercise __________ times per day. Document Released: 12/17/2005 Document Revised: 02/21/2012 Document Reviewed: 03/13/2009 [facility] Patient Information 2013 [place], [place]. Knee Exercises EXERCISES RANGE OF MOTION(ROM) AND STRETCHING EXERCISES These exercises may help you when beginning to rehabilitate your injury. Your symptoms may resolve with or without further involvement from your physician, physical therapist or athletic trainer. While  completing these exercises, remember:   Restoring tissue flexibility helps normal motion to return to the joints. This allows healthier, less painful movement and activity.  An effective stretch should be held for at least 30 seconds.  A stretch should never be painful. You should only feel a gentle lengthening or release in the stretched tissue. STRETCH - Knee Extension, Prone  Lie on your stomach on a firm surface, such as a bed or countertop. Place your right / left knee and leg just beyond the edge of the surface. You may wish to place a towel under the far end of your right / left thigh for comfort.  Relax your leg muscles and allow gravity to straighten your knee. Your clinician may advise you to add an ankle weight if more resistance is helpful for you.  You should feel a stretch in the  back of your right / left knee. Hold this position for __________ seconds. Repeat __________ times. Complete this stretch __________ times per day. * Your physician, physical therapist or athletic trainer may ask you to add ankle weight to enhance your stretch.  RANGE OF MOTION - Knee Flexion, Active  Lie on your back with both knees straight. (If this causes back discomfort, bend your opposite knee, placing your foot flat on the floor.)  Slowly slide your heel back toward your buttocks until you feel a gentle stretch in the front of your knee or thigh.  Hold for __________ seconds. Slowly slide your heel back to the starting position. Repeat __________ times. Complete this exercise __________ times per day.  STRETCH - Quadriceps, Prone   Lie on your stomach on a firm surface, such as a bed or padded floor.  Bend your right / left knee and grasp your ankle. If you are unable to reach, your ankle or pant leg, use a belt around your foot to lengthen your reach.  Gently pull your heel toward your buttocks. Your knee should not slide out to the side. You should feel a stretch in the front of your thigh and/or knee.  Hold this position for __________ seconds. Repeat __________ times. Complete this stretch __________ times per day.  STRETCH  Hamstrings, Supine   Lie on your back. Loop a belt or towel over the ball of your right / left foot.  Straighten your right / left knee and slowly pull on the belt to raise your leg. Do not allow the right / left knee to bend. Keep your opposite leg flat on the floor.  Raise the leg until you feel a gentle stretch behind your right / left knee or thigh. Hold this position for __________ seconds. Repeat __________ times. Complete this stretch __________ times per day.  STRENGTHENING EXERCISES These exercises may help you when beginning to rehabilitate your injury. They may resolve your symptoms with or without further involvement from your physician,  physical therapist or athletic trainer. While completing these exercises, remember:   Muscles can gain both the endurance and the strength needed for everyday activities through controlled exercises.  Complete these exercises as instructed by your physician, physical therapist or athletic trainer. Progress the resistance and repetitions only as guided.  You may experience muscle soreness or fatigue, but the pain or discomfort you are trying to eliminate should never worsen during these exercises. If this pain does worsen, stop and make certain you are following the directions exactly. If the pain is still present after adjustments, discontinue the exercise until you can discuss the trouble  with your clinician. STRENGTH - Quadriceps, Isometrics  Lie on your back with your right / left leg extended and your opposite knee bent.  Gradually tense the muscles in the front of your right / left thigh. You should see either your knee cap slide up toward your hip or increased dimpling just above the knee. This motion will push the back of the knee down toward the floor/mat/bed on which you are lying.  Hold the muscle as tight as you can without increasing your pain for __________ seconds.  Relax the muscles slowly and completely in between each repetition. Repeat __________ times. Complete this exercise __________ times per day.  STRENGTH - Quadriceps, Short Arcs   Lie on your back. Place a __________ inch towel roll under your knee so that the knee slightly bends.  Raise only your lower leg by tightening the muscles in the front of your thigh. Do not allow your thigh to rise.  Hold this position for __________ seconds. Repeat __________ times. Complete this exercise __________ times per day.  OPTIONAL ANKLE WEIGHTS: Begin with ____________________, but DO NOT exceed ____________________. Increase in 1 pound/0.5 kilogram increments.  STRENGTH - Quadriceps, Straight Leg Raises  Quality counts! Watch  for signs that the quadriceps muscle is working to insure you are strengthening the correct muscles and not "cheating" by substituting with healthier muscles.  Lay on your back with your right / left leg extended and your opposite knee bent.  Tense the muscles in the front of your right / left thigh. You should see either your knee cap slide up or increased dimpling just above the knee. Your thigh may even quiver.  Tighten these muscles even more and raise your leg 4 to 6 inches off the floor. Hold for __________ seconds.  Keeping these muscles tense, lower your leg.  Relax the muscles slowly and completely in between each repetition. Repeat __________ times. Complete this exercise __________ times per day.  STRENGTH - Hamstring, Curls  Lay on your stomach with your legs extended. (If you lay on a bed, your feet may hang over the edge.)  Tighten the muscles in the back of your thigh to bend your right / left knee up to 90 degrees. Keep your hips flat on the bed/floor.  Hold this position for __________ seconds.  Slowly lower your leg back to the starting position. Repeat __________ times. Complete this exercise __________ times per day.  OPTIONAL ANKLE WEIGHTS: Begin with ____________________, but DO NOT exceed ____________________. Increase in 1 pound/0.5 kilogram increments.  STRENGTH  Quadriceps, Squats  Stand in a door frame so that your feet and knees are in line with the frame.  Use your hands for balance, not support, on the frame.  Slowly lower your weight, bending at the hips and knees. Keep your lower legs upright so that they are parallel with the door frame. Squat only within the range that does not increase your knee pain. Never let your hips drop below your knees.  Slowly return upright, pushing with your legs, not pulling with your hands. Repeat __________ times. Complete this exercise __________ times per day.  STRENGTH - Quadriceps, Wall Slides  Follow guidelines  for form closely. Increased knee pain often results from poorly placed feet or knees.  Lean against a smooth wall or door and walk your feet out 18-24 inches. Place your feet hip-width apart.  Slowly slide down the wall or door until your knees bend __________ degrees.* Keep your knees over your heels,  not your toes, and in line with your hips, not falling to either side.  Hold for __________ seconds. Stand up to rest for __________ seconds in between each repetition. Repeat __________ times. Complete this exercise __________ times per day. * Your physician, physical therapist or athletic trainer will alter this angle based on your symptoms and progress. Document Released: 10/13/2005 Document Revised: 02/21/2012 Document Reviewed: 03/13/2009 [facility] Patient Information 2013 [place], [place].

## 2012-09-23 ENCOUNTER — Encounter: Payer: Self-pay | Admitting: Internal Medicine

## 2012-09-23 DIAGNOSIS — M545 Low back pain: Secondary | ICD-10-CM | POA: Insufficient documentation

## 2012-09-23 DIAGNOSIS — M25562 Pain in left knee: Secondary | ICD-10-CM | POA: Insufficient documentation

## 2012-09-23 NOTE — Progress Notes (Signed)
  Subjective:    Patient ID: Teresa Hickman, female    DOB: 1971/02/20, 41 y.o.   MRN: [id number]  HPI Patient comes in today for SDA for  new problem evaluation. Onset for the past months of bilateral anterior knee pain  That is progressive and hurts to knee and get up from floor. Has to knee and squat 5 times  day to pray  hurts to do this and ocass lbp without radiation . No specific injury otherwise.  No leg pain from back . Used ibuprofen  Sx are  persistent or progressive . Also interested again in contraception that doesn't cause irreg bleeding or heavy . Plans with gyne fell through as they didn't call her back.  Has seen 2 different gyne. Review of Systems No cp sob  New has bleeding uti sx.  Past history family history social history reviewed in the electronic medical record.    Objective:   Physical Exam BP 110/80  Pulse 86  Temp 98.2 F (36.8 C) (Oral)  Wt 195 lb (88.451 kg)  SpO2 98%  LMP 08/30/2012 Wt Readings from Last 3 Encounters:  09/22/12 195 lb (88.451 kg)  08/23/11 189 lb (85.73 kg)  03/18/11 194 lb (87.998 kg)   WDWN in nad Knees: bilateral darkening  Of skin on patella tib tuberosity with tenderness at pat tib tendon minimal swelling rom good slight crepitus left knee neg drawer seems stable.  Gait normal Back good rom ls area midline  Toe heel strength is normal  No clubbing cyanosis or edema   Assessment & Plan:  Bilateral knee pain from kneeing on hard surface traumatic tendinitis bursitis?  Disc prevention and rx  Padding ice nsaid  Knee exercise to strengthen knees if done hurt LBP mechanical same as above exercise reviewed  No crunches lose weight if  persistent or progressive consider pt or ortho sm eval.  ibu ok   Weight loss will help.  Irrreg menses contraceptive  need We took her off combined ocps in the past cause of elevated bp on meds and increasing migraine has both better since off . However  Had irreg menses with the minipill   She will  contact gyne that she saw over a year ago and get opinion as I  Can offer limited options that are not what she wishes at this point  Can contact us in meantime if needed to help.   Total visit > 50% spent counseling and coordinating care

## 2013-02-26 ENCOUNTER — Telehealth: Payer: Self-pay | Admitting: Internal Medicine

## 2013-02-26 NOTE — Telephone Encounter (Signed)
Patient Information:  Caller Name: Shantelle  Phone: [phone]  Patient: Teresa Hickman, Teresa Hickman  Gender: Female  DOB: 1971/10/17  Age: 42 Years  PCP: Berniece Andreas [facility])  Pregnant: No  Office Follow Up:  Does the office need to follow up with this patient?: Yes  Instructions For The Office: Please review, patient would like to be seen to evaluate abdominal pain. Is about 15 minute drive away.  Appt not available in EPIC, please contact patient to schedule Appt.   Symptoms  Reason For Call & Symptoms: Abdominal pain at intervals for 2 weeks, pain is about waist level and starts like period going to start and feels like pain is inside only.  Last episode was 2 days ago 3/15 and noticed spotting.  No pain currently.  Denies nausea, vomiting, diarrhea, able to go about usual activities.  Would like OV to see what is happening, what has caused this  Reviewed Health History In EMR: Yes  Reviewed Medications In EMR: Yes  Reviewed Allergies In EMR: Yes  Reviewed Surgeries / Procedures: Yes  Date of Onset of Symptoms: 02/12/2013 OB / GYN:  LMP: 02/10/2013  Guideline(s) Used:  Abdominal Pain - Menstrual Cramps  Abdominal Pain - Female  Disposition Per Guideline:   See Today in Office  Reason For Disposition Reached:   Patient wants to be seen  Advice Given:  N/A  Patient Will Follow Care Advice:  YES

## 2013-02-27 ENCOUNTER — Ambulatory Visit (INDEPENDENT_AMBULATORY_CARE_PROVIDER_SITE_OTHER): Payer: 59 | Admitting: Internal Medicine

## 2013-02-27 ENCOUNTER — Encounter: Payer: Self-pay | Admitting: Internal Medicine

## 2013-02-27 VITALS — BP 144/92 | HR 86 | Temp 99.0°F | Wt 202.0 lb

## 2013-02-27 DIAGNOSIS — N930 Postcoital and contact bleeding: Secondary | ICD-10-CM

## 2013-02-27 DIAGNOSIS — N921 Excessive and frequent menstruation with irregular cycle: Secondary | ICD-10-CM

## 2013-02-27 DIAGNOSIS — N923 Ovulation bleeding: Secondary | ICD-10-CM

## 2013-02-27 NOTE — Telephone Encounter (Signed)
Please call the patient and schedule an appt.  Pt has been seen for irregular menses in the past .  Do not use SDA.

## 2013-02-27 NOTE — Progress Notes (Signed)
Chief Complaint  Patient presents with  . Abdominal Pain  . Menstrual Problem    HPI: Patient comes in today for SDA for  new problem evaluation.  patient has some concern of some different bleeding pattern. Post post coital blood    .  Recently more than once however not today. LMP  3/ 4      And normal.   Using  rhythym  method.  Bright red bleeding. When this happens with very mild abdominal pain no UTI symptoms other bleeding. ROS: See pertinent positives and negatives per HPI. No vaginal discharge dysuria exposures. No unusual rashes. See past history of irregular bleeding last period was normal.  Past Medical History  Diagnosis Date  . Migraine     with some aura  . Hyperlipidemia     Family History  Problem Relation Age of Onset  . Hypertension Mother   . Diabetes Mother   . Stroke Mother     67s [place]  2010    History   Social History  . Marital Status: Married    Spouse Name: N/A    Number of Children: N/A  . Years of Education: N/A   Social History Main Topics  . Smoking status: Never Smoker   . Smokeless tobacco: None  . Alcohol Use: No  . Drug Use: No  . Sexually Active:    Other Topics Concern  . None   Social History Narrative   From [place]   Medical Family   HH of 6    No pets   G4 p4     Outpatient Encounter Prescriptions as of 02/27/2013  Medication Sig Dispense Refill  . [DISCONTINUED] ibuprofen (ADVIL,MOTRIN) 800 MG tablet Take 800 mg by mouth every 6 (six) hours as needed.         No facility-administered encounter medications on file as of 02/27/2013.    EXAM:  BP 144/92  Pulse 86  Temp(Src) 99 F (37.2 C) (Oral)  Wt 202 lb (91.627 kg)  BMI 34.15 kg/m2  SpO2 98%  LMP 02/10/2013  Body mass index is 34.15 kg/(m^2).  GENERAL: vitals reviewed and listed above, alert, oriented, appears well hydrated and in no acute distress a bit concerned  HEENT: atraumatic, conjunctiva  clear, no obvious abnormalities on inspection of external  nose and ears  NECK: no obvious masses on inspection palpation    PSYCH: pleasant and cooperative, no obvious depression or anxiety  ASSESSMENT AND PLAN:  Discussed the following assessment and plan:  Intermenstrual spotting  PCB (post coital bleeding) Patient will come back 2 days from now we will do a Pap and a pelvic and cervical samples. If she has persistent problems she may need ultrasound GYN consult etc. However at this time we'll just do an initial evaluation. -Patient advised to return or notify health care team  if symptoms worsen or persist or new concerns arise.  Patient Instructions  Come back on Thursday 10 145 for pap and exam to check out your symptoms.   Neta Mends. Carr Shartzer M.D.

## 2013-02-27 NOTE — Patient Instructions (Signed)
Come back on Thursday 10 145 for pap and exam to check out your symptoms.   Teresa Hickman

## 2013-02-27 NOTE — Telephone Encounter (Signed)
appt set/kh 

## 2013-03-01 ENCOUNTER — Other Ambulatory Visit (HOSPITAL_COMMUNITY)
Admission: RE | Admit: 2013-03-01 | Discharge: 2013-03-01 | Disposition: A | Discharge status: Home / Self Care | Payer: 59 | Source: Ambulatory Visit | Attending: Internal Medicine | Admitting: Internal Medicine

## 2013-03-01 ENCOUNTER — Ambulatory Visit (INDEPENDENT_AMBULATORY_CARE_PROVIDER_SITE_OTHER): Payer: 59 | Admitting: Internal Medicine

## 2013-03-01 ENCOUNTER — Encounter: Payer: Self-pay | Admitting: Internal Medicine

## 2013-03-01 ENCOUNTER — Other Ambulatory Visit: Payer: Self-pay | Admitting: Internal Medicine

## 2013-03-01 VITALS — BP 160/94 | HR 79 | Temp 98.5°F | Wt 200.0 lb

## 2013-03-01 DIAGNOSIS — Z113 Encounter for screening for infections with a predominantly sexual mode of transmission: Secondary | ICD-10-CM | POA: Insufficient documentation

## 2013-03-01 DIAGNOSIS — N760 Acute vaginitis: Secondary | ICD-10-CM | POA: Insufficient documentation

## 2013-03-01 DIAGNOSIS — Z01419 Encounter for gynecological examination (general) (routine) without abnormal findings: Secondary | ICD-10-CM | POA: Insufficient documentation

## 2013-03-01 DIAGNOSIS — Z1151 Encounter for screening for human papillomavirus (HPV): Secondary | ICD-10-CM | POA: Insufficient documentation

## 2013-03-01 LAB — POCT URINALYSIS DIPSTICK
Glucose, UA: NEGATIVE
Nitrite, UA: NEGATIVE
Protein, UA: NEGATIVE
Spec Grav, UA: 1.02
Urobilinogen, UA: 0.2

## 2013-03-01 LAB — POCT URINE PREGNANCY: Preg Test, Ur: NEGATIVE

## 2013-03-01 NOTE — Progress Notes (Signed)
Chief Complaint  Patient presents with  . Follow-up    HPI: Comes  in for fu of prev visit for exam and pap.   Concern about hx of post coital spotting   X 1  wihtout other sig six . No vag dc  No reg contraception but  Rhythym.  No uti sx and no hx of same sx   Not a hx of elevated bp readings    A bit anxious    regarding this problem .  No  abd pain  Bleeding . ROS: See pertinent positives and negatives per HPI.  Past Medical History  Diagnosis Date  . Migraine     with some aura  . Hyperlipidemia     Family History  Problem Relation Age of Onset  . Hypertension Mother   . Diabetes Mother   . Stroke Mother     61s [place]  2010    History   Social History  . Marital Status: Married    Spouse Name: N/A    Number of Children: N/A  . Years of Education: N/A   Social History Main Topics  . Smoking status: Never Smoker   . Smokeless tobacco: None  . Alcohol Use: No  . Drug Use: No  . Sexually Active:    Other Topics Concern  . None   Social History Narrative   From [place]   Medical Family   HH of 6    No pets   G4 p4     No outpatient encounter prescriptions on file as of 03/01/2013.   No facility-administered encounter medications on file as of 03/01/2013.    EXAM:  BP 160/94  Pulse 79  Temp(Src) 98.5 F (36.9 C) (Oral)  Wt 200 lb (90.719 kg)  BMI 33.81 kg/m2  SpO2 98%  LMP 02/10/2013  Body mass index is 33.81 kg/(m^2).  GENERAL: vitals reviewed and listed above, alert, oriented, appears well hydrated and in no acute distress abd no masses  Pelvic: NL ext GU, labia clear without lesions or rash . Vagina no lesions .Cervix: irreg flat ectopy no lesion   Friable with brush but no mucopus .   UTERUS: Neg CMT Adnexa:  clear no  Obvious masses . PAP done and screening gc chl trick hpv  bv and candida   PSYCH: pleasant and cooperative, no obvious depression or anxiety  ASSESSMENT AND PLAN:  Discussed the following assessment and plan:  Intermenstrual  spotting - Plan: PAP [[facility]], POCT Pregnancy, Urine, POCT urine pregnancy, POC Urinalysis Dipstick  PCB (post coital bleeding) - mild ? cervicitis vs normal - Plan: PAP [[place]], POCT Pregnancy, Urine, POCT urine pregnancy, POC Urinalysis Dipstick  Elevated blood pressure reading - could be situational get check elsewhere and fu if elevated .  Discussion about options at this time no treatment unless recurrent consider impaired antibiotic treatment if appropriate awaiting Pap and infection results  UCG was negative urine had trace leukocytes positive blood but this was after the pelvic exam and is not felt to be significant. -Patient advised to return or notify health care team  if symptoms worsen or persist or new concerns arise.  Patient Instructions  Your exam is normal except for slight inflammation of the cervix which is not severe at all to inspection.  We'll await the findings of the Pap smear and infection screen.  Some gynecologist we'll treat with a week of doxycycline for nonspecific inflammation. Even if a germ is not specifically causing this.  If it is recurring symptoms would do this treatment and if still present he further evaluation  Your blood pressure was elevated today . Checked her readings at home or elsewhere to ensure that they are below 140/90 on a regular basis if they're elevated contact us for followup.  Get a pregnancy test today before you leave   Neta Mends. Itamar Mcgowan M.D.

## 2013-03-01 NOTE — Patient Instructions (Addendum)
Your exam is normal except for slight inflammation of the cervix which is not severe at all to inspection.  We'll await the findings of the Pap smear and infection screen.  Some gynecologist we'll treat with a week of doxycycline for nonspecific inflammation. Even if a germ is not specifically causing this.  If it is recurring symptoms would do this treatment and if still present he further evaluation  Your blood pressure was elevated today . Checked her readings at home or elsewhere to ensure that they are below 140/90 on a regular basis if they're elevated contact us for followup.  Get a pregnancy test today before you leave

## 2013-03-12 NOTE — Progress Notes (Signed)
Quick Note:  Tell patient PAP is normal. Neg HPV And all tests negative except Some bacteria that can cause a vaginosis. Can treat With flagyl 500 mg 1 po Bid for 7 days . Marland Kitchen This is not a transmittable infection . And uncertain if related to the problem she had. ______

## 2013-03-14 ENCOUNTER — Other Ambulatory Visit: Payer: Self-pay | Admitting: Family Medicine

## 2013-03-14 MED ORDER — METRONIDAZOLE 500 MG PO TABS: 500.0000 mg | ORAL_TABLET | Freq: Three times a day (TID) | ORAL | Status: DC

## 2013-06-18 ENCOUNTER — Encounter: Payer: Self-pay | Admitting: Internal Medicine

## 2013-06-18 ENCOUNTER — Ambulatory Visit (INDEPENDENT_AMBULATORY_CARE_PROVIDER_SITE_OTHER): Payer: 59 | Admitting: Internal Medicine

## 2013-06-18 VITALS — BP 142/86 | HR 76 | Temp 98.1°F | Wt 203.0 lb

## 2013-06-18 DIAGNOSIS — Z8669 Personal history of other diseases of the nervous system and sense organs: Secondary | ICD-10-CM

## 2013-06-18 DIAGNOSIS — N920 Excessive and frequent menstruation with regular cycle: Secondary | ICD-10-CM

## 2013-06-18 DIAGNOSIS — R030 Elevated blood-pressure reading, without diagnosis of hypertension: Secondary | ICD-10-CM

## 2013-06-18 NOTE — Progress Notes (Signed)
Chief Complaint  Patient presents with  . Long/heavy menstrual cycle    Today is much better.  Last several days have been very heavy.  Would like to discuss restarting BC pills.    HPI: Patient comes in today for SDA for   problem evaluation.  Since her last visit the intermenstrual spotting and vaginal infection is resolved. However her periods are becoming frequent and heavy normally Usually 4-5 days     Period and this time     2 x per months  .  Him times up to 9 days although today it is much better.  She brings up potential of going back on combined birth control pills.  Not having current migraines. \ Last check with GYN was a few years ago when we had discussed the possibility of an IUD. Since she decided she didn't want one at that time she did not go back.    Review of my past history is that she had a history of classic migraine visual when she was on OCPs and elevated blood pressure so I stopped them. She's taken progesterone only OCPs without satisfaction.  ROS: See pertinent positives and negatives per HPI.  Past Medical History  Diagnosis Date  . Migraine     with some aura  . Hyperlipidemia     Family History  Problem Relation Age of Onset  . Hypertension Mother   . Diabetes Mother   . Stroke Mother     81s [place]  2010    History   Social History  . Marital Status: Married    Spouse Name: N/A    Number of Children: N/A  . Years of Education: N/A   Social History Main Topics  . Smoking status: Never Smoker   . Smokeless tobacco: None  . Alcohol Use: No  . Drug Use: No  . Sexually Active:    Other Topics Concern  . None   Social History Narrative   From [place]   Medical Family   HH of 6    No pets   G4 p4     Outpatient Encounter Prescriptions as of 06/18/2013  Medication Sig Dispense Refill  . [DISCONTINUED] metroNIDAZOLE (FLAGYL) 500 MG tablet Take 1 tablet (500 mg total) by mouth 3 (three) times daily.  14 tablet  0   No  facility-administered encounter medications on file as of 06/18/2013.    EXAM:  BP 142/86  Pulse 76  Temp(Src) 98.1 F (36.7 C) (Oral)  Wt 203 lb (92.08 kg)  BMI 34.32 kg/m2  SpO2 98%  LMP 06/10/2013  Body mass index is 34.32 kg/(m^2).  GENERAL: vitals reviewed and listed above, alert, oriented, appears well hydrated and in no acute distress  HEENT: atraumatic, conjunctiva  clear, no obvious abnormalities on inspection of external nose and ears  NECK: no obvious masses on inspection palpation no adenopathy Repeat blood pressure large cuff 138/88 right arm CV: HRRR, no clubbing cyanosis or  peripheral edema nl cap refill  MS: moves all extremities without noticeable focal  abnormality  PSYCH: pleasant and cooperative, no obvious depression or anxiety  ASSESSMENT AND PLAN:  Discussed the following assessment and plan:  Heavy periods - Plan: POCT hemoglobin  Elevated blood pressure reading I would really like her to see a specialist in regard to managing bleeding contraception in the context of slightly elevated blood pressure that increased on combined OCPs in the past and a remote history of migraine with aura. I don't remember if these  were menstrual related. Progesterone only oral has not been satisfactory currently she is on no medicine for this.  Will refer to female GYN if felt the benefit more than the risk taken prescribed oral contraceptive combined if appropriate. Other options discussed by them. She is hemodynamically stable her hemoglobin is borderline low -Patient advised to return or notify health care team  if symptoms worsen or persist or new concerns arise.  Patient Instructions  Your blood pressure is still borderline elevated  Which can be  Treated if needed. Get readings  3 x per week for 3 weeks and send in readings   . If elevated  Continued we may need to add medication.     I do not feel comfortable using estrogen based pills    Because of the migraine  history . However  A gynecologist  Could advise about which is the safest path to treat the periods  .   Will arrange  Gyne opinion   Female as requested.   Your hemoglobin shows mild borderline anemia . 11.7      Neta Mends. Darrian Grzelak M.D.

## 2013-06-18 NOTE — Patient Instructions (Addendum)
Your blood pressure is still borderline elevated  Which can be  Treated if needed. Get readings  3 x per week for 3 weeks and send in readings   . If elevated  Continued we may need to add medication.     I do not feel comfortable using estrogen based pills    Because of the migraine history . However  A gynecologist  Could advise about which is the safest path to treat the periods  .   Will arrange  Gyne opinion   Female as requested.   Your hemoglobin shows mild borderline anemia . 11.7

## 2013-06-22 ENCOUNTER — Encounter: Payer: Self-pay | Admitting: Gynecology

## 2013-06-22 ENCOUNTER — Ambulatory Visit (INDEPENDENT_AMBULATORY_CARE_PROVIDER_SITE_OTHER): Payer: 59 | Admitting: Gynecology

## 2013-06-22 VITALS — BP 116/80 | HR 66 | Resp 12 | Ht 65.0 in | Wt 206.0 lb

## 2013-06-22 DIAGNOSIS — Z0000 Encounter for general adult medical examination without abnormal findings: Secondary | ICD-10-CM

## 2013-06-22 DIAGNOSIS — G43109 Migraine with aura, not intractable, without status migrainosus: Secondary | ICD-10-CM

## 2013-06-22 DIAGNOSIS — Z309 Encounter for contraceptive management, unspecified: Secondary | ICD-10-CM

## 2013-06-22 NOTE — Patient Instructions (Addendum)

## 2013-06-22 NOTE — Progress Notes (Signed)
42 y.o. Single African American female   G4P4 here for contraceptive management referred by Dr Elliot Gurney. Pt is currently sexually active.  Pt denies dyspareunia, reports some dysmenorrhea treats with motrin 400mg  with success, pain for 1-2d only.    Pt states cycles are regular usually 4d of flow but current cycle is 9d, Pt is not using contraception, was on ocp but stopped due to spotting, progestin only?  Pt interested in contraception, she had been offered IUD in past but did not follow through  Patient's last menstrual period was 06/10/2013.          Sexually active: yes The current method of family planning is none.    Exercising: no  not regularly sometimes Last pap: 03/01/13- Nl, neg HRHPV Mammogram- Normal 2012 Alcohol: no Tobacco: no BSE: no  Hgb: PCP ; Urine: PCP    Health Maintenance  Topic Date Due  . Influenza Vaccine  08/13/2013  . Pap Smear  03/01/2016  . Tetanus/tdap  08/16/2016    Family History  Problem Relation Age of Onset  . Hypertension Mother   . Diabetes Mother   . Stroke Mother     70s SD  2010    Patient Active Problem List   Diagnosis Date Noted  . Hx of migraine with aura 06/18/2013  . Elevated blood pressure reading 06/18/2013  . Bilateral anterior knee pain 09/23/2012  . Low back pain 09/23/2012  . Contraception management 03/18/2011  . Irregular menses 03/18/2011  . ELEVATED BLOOD PRESSURE WITHOUT DIAGNOSIS OF HYPERTENSION 03/02/2010  . AMENORRHEA 10/16/2008  . HYPERLIPIDEMIA 05/01/2008  . MIGRAINE HEADACHE 05/01/2008  . HEADACHE 05/01/2008  . LUMBAR STRAIN 05/01/2008    Past Medical History  Diagnosis Date  . Migraine     with some aura  . Hyperlipidemia   . Dysmenorrhea     History reviewed. No pertinent past surgical history.  Allergies: Review of patient's allergies indicates no known allergies.  Current Outpatient Prescriptions  Medication Sig Dispense Refill  . ibuprofen (ADVIL,MOTRIN) 200 MG tablet Take 200 mg by mouth  as needed for pain.       No current facility-administered medications for this visit.    ROS: Pertinent items are noted in HPI.  Exam:    BP 116/80  Pulse 66  Resp 12  Ht 5\' 5"  (1.651 m)  Wt 206 lb (93.441 kg)  BMI 34.28 kg/m2  LMP 06/10/2013 Weight change: @WEIGHTCHANGE @ Last 3 height recordings:  Ht Readings from Last 3 Encounters:  06/22/13 5\' 5"  (1.651 m)  03/18/11 5' 4.5" (1.638 m)  04/27/10 5' 4.5" (1.638 m)   General appearance: alert, cooperative and appears stated age Head: Normocephalic, without obvious abnormality, atraumatic Neck: no adenopathy, no carotid bruit, no JVD, supple, symmetrical, trachea midline and thyroid not enlarged, symmetric, no tenderness/mass/nodules Lungs: clear to auscultation bilaterally Breasts: normal appearance, no masses or tenderness Heart: regular rate and rhythm, S1, S2 normal, no murmur, click, rub or gallop Abdomen: soft, non-tender; bowel sounds normal; no masses,  no organomegaly Extremities: extremities normal, atraumatic, no cyanosis or edema Skin: Skin color, texture, turgor normal. No rashes or lesions Lymph nodes: Cervical, supraclavicular, and axillary nodes normal. no inguinal nodes palpated Neurologic: Grossly normal   Pelvic: External genitalia:  no lesions              Urethra: normal appearing urethra with no masses, tenderness or lesions              Bartholins and Skenes: normal  Vagina: normal appearing vagina with normal color and discharge, no lesions              Cervix: normal appearance              Pap taken: no        Bimanual Exam:  Uterus:  retroverted, exam limited by habitus                                      Adnexa:    no masses and limited by habitus                                      Rectovaginal: Confirms                                      Anus:  normal sphincter tone, no lesions  A: well woman Contraceptive management History of migraine with aura Elevated BP last ov, no  treatment     P: Discussed at length her contraceptive options, in reality her menses are quite regular except for the last cycle that was longer than usual.  We discussed the risk of stroke with combination ocp and migraine with aura in older patients, in addition pt was noted to have elevated BP at last office visit, but is normal today, she has a family history of hypertension.  We discussed Mirena and Paraguard IUD's risks and benefits of each was reviewed and questions were addressed.  Pt was given information on progestin IUD to review, she will discuss with her husband and get back to [place].  We recommend she take a higher dose of motrin as needed for her dysmenorrhea Mammogram annually recommended return annually or prn Length of time reviewing history and discussing contraception >49m, >50%face to face  An After Visit Summary was printed and given to the patient.

## 2013-06-27 ENCOUNTER — Telehealth: Payer: Self-pay | Admitting: Gynecology

## 2013-06-27 NOTE — Telephone Encounter (Signed)
LMTCB to discuss insurance benefits.  °

## 2013-07-12 ENCOUNTER — Ambulatory Visit (INDEPENDENT_AMBULATORY_CARE_PROVIDER_SITE_OTHER): Payer: 59 | Admitting: Gynecology

## 2013-07-12 VITALS — BP 118/72 | HR 62 | Resp 12 | Wt 201.0 lb

## 2013-07-12 DIAGNOSIS — N390 Urinary tract infection, site not specified: Secondary | ICD-10-CM

## 2013-07-12 DIAGNOSIS — Z309 Encounter for contraceptive management, unspecified: Secondary | ICD-10-CM

## 2013-07-12 NOTE — Progress Notes (Signed)
42 yrsMarriedAfrican Americanfemale presents for  insertion of Mirena. Denies any vaginal symptoms or STD concerns. Pt reports feeling sick with nausea and vomited twice yesterday, went to urgent care and diagnosed with UTI, pt started on Levofloxin 750mg .  Pt is starting to feel a little bit better.   Discussed with pt, recommend deferring IUD for now as she is not well and we can reschedule next month.

## 2013-10-18 ENCOUNTER — Other Ambulatory Visit: Payer: Self-pay

## 2014-02-01 ENCOUNTER — Encounter: Payer: Self-pay | Admitting: Internal Medicine

## 2014-02-01 ENCOUNTER — Ambulatory Visit (INDEPENDENT_AMBULATORY_CARE_PROVIDER_SITE_OTHER): Payer: 59 | Admitting: Internal Medicine

## 2014-02-01 VITALS — BP 144/90 | HR 90 | Temp 98.1°F | Ht 65.0 in | Wt 204.0 lb

## 2014-02-01 DIAGNOSIS — IMO0001 Reserved for inherently not codable concepts without codable children: Secondary | ICD-10-CM

## 2014-02-01 DIAGNOSIS — R42 Dizziness and giddiness: Secondary | ICD-10-CM

## 2014-02-01 DIAGNOSIS — R030 Elevated blood-pressure reading, without diagnosis of hypertension: Secondary | ICD-10-CM

## 2014-02-01 NOTE — Patient Instructions (Signed)
  Check your blood  Pressure at home to make sure it is below 140/90 . If elevated x 3 readings that would be a reason to add medication.  Most causes of vertigo are benign and inner ear. And resolve on their own  However because of the ? Of a hearing issue with the first episode  Consider other evaluation  . Ie ENT evaluation  For possible menieres issue  Your exam is good today. Other wise which is reassuring  Can take as needed meclizine  but this doesn't cure the problem .   Vertigo Vertigo means you feel like you or your surroundings are moving when they are not. Vertigo can be dangerous if it occurs when you are at work, driving, or performing difficult activities.  CAUSES  Vertigo occurs when there is a conflict of signals sent to your brain from the visual and sensory systems in your body. There are many different causes of vertigo, including:  Infections, especially in the inner ear.  A bad reaction to a drug or misuse of alcohol and medicines.  Withdrawal from drugs or alcohol.  Rapidly changing positions, such as lying down or rolling over in bed.  A migraine headache.  Decreased blood flow to the brain.  Increased pressure in the brain from a head injury, infection, tumor, or bleeding. SYMPTOMS  You may feel as though the world is spinning around or you are falling to the ground. Because your balance is upset, vertigo can cause nausea and vomiting. You may have involuntary eye movements (nystagmus). DIAGNOSIS  Vertigo is usually diagnosed by physical exam. If the cause of your vertigo is unknown, your caregiver may perform imaging tests, such as an MRI scan (magnetic resonance imaging). TREATMENT  Most cases of vertigo resolve on their own, without treatment. Depending on the cause, your caregiver may prescribe certain medicines. If your vertigo is related to body position issues, your caregiver may recommend movements or procedures to correct the problem. In rare cases,  if your vertigo is caused by certain inner ear problems, you may need surgery. HOME CARE INSTRUCTIONS   Follow your caregiver's instructions.  Avoid driving.  Avoid operating heavy machinery.  Avoid performing any tasks that would be dangerous to you or others during a vertigo episode.  Tell your caregiver if you notice that certain medicines seem to be causing your vertigo. Some of the medicines used to treat vertigo episodes can actually make them worse in some people. SEEK IMMEDIATE MEDICAL CARE IF:   Your medicines do not relieve your vertigo or are making it worse.  You develop problems with talking, walking, weakness, or using your arms, hands, or legs.  You develop severe headaches.  Your nausea or vomiting continues or gets worse.  You develop visual changes.  A family member notices behavioral changes.  Your condition gets worse. MAKE SURE YOU:  Understand these instructions.  Will watch your condition.  Will get help right away if you are not doing well or get worse. Document Released: 09/08/2005 Document Revised: 02/21/2012 Document Reviewed: 06/17/2011 [facility] Patient Information 2014 [place].

## 2014-02-01 NOTE — Progress Notes (Signed)
Chief Complaint  Patient presents with  . Dizziness    Ongoing for 2 wks.  Does not happen every day.  Last for an hour once a day.    HPI: Patient comes in today for SDA for  new problem evaluation. See above   First woke up and had like a cold in ear. Hard to head   days   And  had dizziness spinning  Then vomited with this .  Husband gave her a medicine fpor this and felt better .  Then recurred   In night again. No hearing issue with the second one.   Second one she felt  fle tit coming on.   Felt like migarine coming on.  novision sx   In between ok. No ha fainting other neuro sx   Mom died in the night sudden ? MI 32   Neg hx hearing issues  ROS: See pertinent positives and negatives per HPI.no fever cp sob  Not associated with fever ha uri sx .  No recent aura migraine  hasnt had the iud yet was sick that day and hasn't  rescheduled   Past Medical History  Diagnosis Date  . Migraine     with some aura  . Hyperlipidemia   . Dysmenorrhea     Family History  Problem Relation Age of Onset  . Hypertension Mother   . Diabetes Mother   . Stroke Mother     4s SD  2010    History   Social History  . Marital Status: Married    Spouse Name: N/A    Number of Children: N/A  . Years of Education: N/A   Social History Main Topics  . Smoking status: Never Smoker   . Smokeless tobacco: None  . Alcohol Use: No  . Drug Use: No  . Sexual Activity: No   Other Topics Concern  . None   Social History Narrative   From [place]   Medical Family   HH of 6    No pets   G4 p4     Outpatient Encounter Prescriptions as of 02/01/2014  Medication Sig  . ibuprofen (ADVIL,MOTRIN) 200 MG tablet Take 200 mg by mouth as needed for pain.    EXAM:  BP 144/90  Pulse 90  Temp(Src) 98.1 F (36.7 C) (Oral)  Ht 5\' 5"  (1.651 m)  Wt 204 lb (92.534 kg)  BMI 33.95 kg/m2  SpO2 98%  Body mass index is 33.95 kg/(m^2).  GENERAL: vitals reviewed and listed above, alert,  oriented, appears well hydrated and in no acute distress HEENT: atraumatic, conjunctiva  clear, no obvious abnormalities on inspection of external nose and ears tmx intact no obv abnormality  OP : no lesion edema or exudate tongue is midline NECK: no obvious masses on inspection palpation no bruit or JVD LUNGS: clear to auscultation bilaterally, no wheezes, rales or rhonchi, good air movement CV: HRRR, no clubbing cyanosis or  peripheral edema nl cap refill I don't hear a murmur today MS: moves all extremities without noticeable focal  abnormality Neurologic grossly nonfocal no motor deficits DTRs intact normal gait heel to toe negative Romberg PSYCH: pleasant and cooperative, no obvious depression or anxiety ASSESSMENT AND PLAN:  Discussed the following assessment and plan:  Vertigo, intermittent - one episode with decrease hearing left and vomiting nl exam today except bp get ent  opinon  ? meniers vs  other vertigo - Plan: Ambulatory referral to ENT  Elevated blood pressure Has had  elevated blood pressure readings in the past one away when she went off OCPs and also monitor have her check again to make sure she is not developing hypertension. Fortunately her exam is good today but question with the hearing changes in the first episode get ENT opinion and rule out Mnire's. -Patient advised to return or notify health care team  if symptoms worsen or persist or new concerns arise.  Patient Instructions   Check your blood  Pressure at home to make sure it is below 140/90 . If elevated x 3 readings that would be a reason to add medication.  Most causes of vertigo are benign and inner ear. And resolve on their own  However because of the ? Of a hearing issue with the first episode  Consider other evaluation  . Ie ENT evaluation  For possible menieres issue  Your exam is good today. Other wise which is reassuring  Can take as needed meclizine  but this doesn't cure the problem  .   Vertigo Vertigo means you feel like you or your surroundings are moving when they are not. Vertigo can be dangerous if it occurs when you are at work, driving, or performing difficult activities.  CAUSES  Vertigo occurs when there is a conflict of signals sent to your brain from the visual and sensory systems in your body. There are many different causes of vertigo, including:  Infections, especially in the inner ear.  A bad reaction to a drug or misuse of alcohol and medicines.  Withdrawal from drugs or alcohol.  Rapidly changing positions, such as lying down or rolling over in bed.  A migraine headache.  Decreased blood flow to the brain.  Increased pressure in the brain from a head injury, infection, tumor, or bleeding. SYMPTOMS  You may feel as though the world is spinning around or you are falling to the ground. Because your balance is upset, vertigo can cause nausea and vomiting. You may have involuntary eye movements (nystagmus). DIAGNOSIS  Vertigo is usually diagnosed by physical exam. If the cause of your vertigo is unknown, your caregiver may perform imaging tests, such as an MRI scan (magnetic resonance imaging). TREATMENT  Most cases of vertigo resolve on their own, without treatment. Depending on the cause, your caregiver may prescribe certain medicines. If your vertigo is related to body position issues, your caregiver may recommend movements or procedures to correct the problem. In rare cases, if your vertigo is caused by certain inner ear problems, you may need surgery. HOME CARE INSTRUCTIONS   Follow your caregiver's instructions.  Avoid driving.  Avoid operating heavy machinery.  Avoid performing any tasks that would be dangerous to you or others during a vertigo episode.  Tell your caregiver if you notice that certain medicines seem to be causing your vertigo. Some of the medicines used to treat vertigo episodes can actually make them worse in some  people. SEEK IMMEDIATE MEDICAL CARE IF:   Your medicines do not relieve your vertigo or are making it worse.  You develop problems with talking, walking, weakness, or using your arms, hands, or legs.  You develop severe headaches.  Your nausea or vomiting continues or gets worse.  You develop visual changes.  A family member notices behavioral changes.  Your condition gets worse. MAKE SURE YOU:  Understand these instructions.  Will watch your condition.  Will get help right away if you are not doing well or get worse. Document Released: 09/08/2005 Document Revised: 02/21/2012 Document Reviewed:  06/17/2011 [employer] Patient Information 2014 [place].       Standley Brooking. Panosh M.D.  Pre visit review using our clinic review tool, if applicable. No additional management support is needed unless otherwise documented below in the visit note.

## 2014-03-05 ENCOUNTER — Telehealth: Payer: Self-pay | Admitting: Internal Medicine

## 2014-03-05 NOTE — Telephone Encounter (Addendum)
Opened in error

## 2014-08-01 ENCOUNTER — Encounter: Payer: Self-pay | Admitting: Internal Medicine

## 2014-08-01 ENCOUNTER — Ambulatory Visit (INDEPENDENT_AMBULATORY_CARE_PROVIDER_SITE_OTHER): Payer: 59 | Admitting: Internal Medicine

## 2014-08-01 VITALS — BP 142/80 | Ht 65.0 in | Wt 203.0 lb

## 2014-08-01 DIAGNOSIS — M79609 Pain in unspecified limb: Secondary | ICD-10-CM

## 2014-08-01 DIAGNOSIS — M25569 Pain in unspecified knee: Secondary | ICD-10-CM | POA: Insufficient documentation

## 2014-08-01 DIAGNOSIS — Z23 Encounter for immunization: Secondary | ICD-10-CM

## 2014-08-01 DIAGNOSIS — M25562 Pain in left knee: Secondary | ICD-10-CM

## 2014-08-01 DIAGNOSIS — M79605 Pain in left leg: Secondary | ICD-10-CM

## 2014-08-01 DIAGNOSIS — R030 Elevated blood-pressure reading, without diagnosis of hypertension: Secondary | ICD-10-CM

## 2014-08-01 NOTE — Patient Instructions (Signed)
bp is elevated  Here.  Check    At home   To make sure below  140/90   Will plan referral to a sports medicine  Specialist  Regarding your knee .  Because it is getting worse and  May benefit from certain exercises.   Seems to be  Around  the knee cap area.   Knee Pain The knee is the complex joint between your thigh and your lower leg. It is made up of bones, tendons, ligaments, and cartilage. The bones that make up the knee are:  The femur in the thigh.  The tibia and fibula in the lower leg.  The patella or kneecap riding in the groove on the lower femur. CAUSES  Knee pain is a common complaint with many causes. A few of these causes are:  Injury, such as:  A ruptured ligament or tendon injury.  Torn cartilage.  Medical conditions, such as:  Gout  Arthritis  Infections  Overuse, over training, or overdoing a physical activity. Knee pain can be minor or severe. Knee pain can accompany debilitating injury. Minor knee problems often respond well to self-care measures or get well on their own. More serious injuries may need medical intervention or even surgery. SYMPTOMS The knee is complex. Symptoms of knee problems can vary widely. Some of the problems are:  Pain with movement and weight bearing.  Swelling and tenderness.  Buckling of the knee.  Inability to straighten or extend your knee.  Your knee locks and you cannot straighten it.  Warmth and redness with pain and fever.  Deformity or dislocation of the kneecap. DIAGNOSIS  Determining what is wrong may be very straight forward such as when there is an injury. It can also be challenging because of the complexity of the knee. Tests to make a diagnosis may include:  Your caregiver taking a history and doing a physical exam.  Routine X-rays can be used to rule out other problems. X-rays will not reveal a cartilage tear. Some injuries of the knee can be diagnosed by:  Arthroscopy a surgical technique by which  a small video camera is inserted through tiny incisions on the sides of the knee. This procedure is used to examine and repair internal knee joint problems. Tiny instruments can be used during arthroscopy to repair the torn knee cartilage (meniscus).  Arthrography is a radiology technique. A contrast liquid is directly injected into the knee joint. Internal structures of the knee joint then become visible on X-ray film.  An MRI scan is a non X-ray radiology procedure in which magnetic fields and a computer produce two- or three-dimensional images of the inside of the knee. Cartilage tears are often visible using an MRI scanner. MRI scans have largely replaced arthrography in diagnosing cartilage tears of the knee.  Blood work.  Examination of the fluid that helps to lubricate the knee joint (synovial fluid). This is done by taking a sample out using a needle and a syringe. TREATMENT The treatment of knee problems depends on the cause. Some of these treatments are:  Depending on the injury, proper casting, splinting, surgery, or physical therapy care will be needed.  Give yourself adequate recovery time. Do not overuse your joints. If you begin to get sore during workout routines, back off. Slow down or do fewer repetitions.  For repetitive activities such as cycling or running, maintain your strength and nutrition.  Alternate muscle groups. For example, if you are a weight lifter, work the upper body  on one day and the lower body the next.  Either tight or weak muscles do not give the proper support for your knee. Tight or weak muscles do not absorb the stress placed on the knee joint. Keep the muscles surrounding the knee strong.  Take care of mechanical problems.  If you have flat feet, orthotics or special shoes may help. See your caregiver if you need help.  Arch supports, sometimes with wedges on the inner or outer aspect of the heel, can help. These can shift pressure away from the  side of the knee most bothered by osteoarthritis.  A brace called an "unloader" brace also may be used to help ease the pressure on the most arthritic side of the knee.  If your caregiver has prescribed crutches, braces, wraps or ice, use as directed. The acronym for this is PRICE. This means protection, rest, ice, compression, and elevation.  Nonsteroidal anti-inflammatory drugs (NSAIDs), can help relieve pain. But if taken immediately after an injury, they may actually increase swelling. Take NSAIDs with food in your stomach. Stop them if you develop stomach problems. Do not take these if you have a history of ulcers, stomach pain, or bleeding from the bowel. Do not take without your caregiver's approval if you have problems with fluid retention, heart failure, or kidney problems.  For ongoing knee problems, physical therapy may be helpful.  Glucosamine and chondroitin are over-the-counter dietary supplements. Both may help relieve the pain of osteoarthritis in the knee. These medicines are different from the usual anti-inflammatory drugs. Glucosamine may decrease the rate of cartilage destruction.  Injections of a corticosteroid drug into your knee joint may help reduce the symptoms of an arthritis flare-up. They may provide pain relief that lasts a few months. You may have to wait a few months between injections. The injections do have a small increased risk of infection, water retention, and elevated blood sugar levels.  Hyaluronic acid injected into damaged joints may ease pain and provide lubrication. These injections may work by reducing inflammation. A series of shots may give relief for as long as 6 months.  Topical painkillers. Applying certain ointments to your skin may help relieve the pain and stiffness of osteoarthritis. Ask your pharmacist for suggestions. Many over the-counter products are approved for temporary relief of arthritis pain.  In some countries, doctors often prescribe  topical NSAIDs for relief of chronic conditions such as arthritis and tendinitis. A review of treatment with NSAID creams found that they worked as well as oral medications but without the serious side effects. PREVENTION  Maintain a healthy weight. Extra pounds put more strain on your joints.  Get strong, stay limber. Weak muscles are a common cause of knee injuries. Stretching is important. Include flexibility exercises in your workouts.  Be smart about exercise. If you have osteoarthritis, chronic knee pain or recurring injuries, you may need to change the way you exercise. This does not mean you have to stop being active. If your knees ache after jogging or playing basketball, consider switching to swimming, water aerobics, or other low-impact activities, at least for a few days a week. Sometimes limiting high-impact activities will provide relief.  Make sure your shoes fit well. Choose footwear that is right for your sport.  Protect your knees. Use the proper gear for knee-sensitive activities. Use kneepads when playing volleyball or laying carpet. Buckle your seat belt every time you drive. Most shattered kneecaps occur in car accidents.  Rest when you are tired. SEEK  MEDICAL CARE IF:  You have knee pain that is continual and does not seem to be getting better.  SEEK IMMEDIATE MEDICAL CARE IF:  Your knee joint feels hot to the touch and you have a high fever. MAKE SURE YOU:   Understand these instructions.  Will watch your condition.  Will get help right away if you are not doing well or get worse. Document Released: 09/26/2007 Document Revised: 02/21/2012 Document Reviewed: 09/26/2007 [facility] Patient Information 2015 [place], [place]. This information is not intended to replace advice given to you by your health care provider. Make sure you discuss any questions you have with your health care provider.

## 2014-08-01 NOTE — Progress Notes (Signed)
Pre visit review using our clinic review tool, if applicable. No additional management support is needed unless otherwise documented below in the visit note.  Chief Complaint  Patient presents with  . Knee Pain    Comes and goes.  Sometimes worse than what it has been in the past.    HPI: Patient comes in today for SDA forproblem evaluation. KNEE PROBLEM WORSE  And  Left mostly   Bilateral crepitus  nmpw limping pail lateral patellae area   And down lateral leg and upt thigh. No fall   Right not as bad.  Has used ibuprofen with some success but persists   bp has been good at home and was normal in dentist office recently.  ROS: See pertinent positives and negatives per HPI.  Past Medical History  Diagnosis Date  . Migraine     with some aura  . Hyperlipidemia   . Dysmenorrhea     Family History  Problem Relation Age of Onset  . Hypertension Mother   . Diabetes Mother   . Stroke Mother     51s SD  2010    History   Social History  . Marital Status: Married    Spouse Name: N/A    Number of Children: N/A  . Years of Education: N/A   Social History Main Topics  . Smoking status: Never Smoker   . Smokeless tobacco: None  . Alcohol Use: No  . Drug Use: No  . Sexual Activity: No   Other Topics Concern  . None   Social History Narrative   From [place]   Medical Family   HH of 6    No pets   G4 p4     Outpatient Encounter Prescriptions as of 08/01/2014  Medication Sig  . ibuprofen (ADVIL,MOTRIN) 200 MG tablet Take 200 mg by mouth as needed for pain.    EXAM:  BP 142/80  Ht 5\' 5"  (1.651 m)  Wt 203 lb (92.08 kg)  BMI 33.78 kg/m2  Body mass index is 33.78 kg/(m^2). bp repeat 142/80 right large  GENERAL: vitals reviewed and listed above, alert, oriented, appears well hydrated and in no acute distress CV: HRRR, no clubbing cyanosis or  peripheral edema nl cap refill  MS: moves all extremities no acute edema redness  bilateral crepitus  Patellar  Tender  left lateral area  Neg fibular pain but points to that area of concer  No sig edema rom nl . Walks with a limp. PSYCH: pleasant and cooperative, no obvious depression or anxiety  ASSESSMENT AND PLAN:  Discussed the following assessment and plan:  Patellar pain, left  Pain of left lower extremity - som radiation ? knee or other   Need for prophylactic vaccination and inoculation against influenza - Plan: Flu Vaccine QUAD 36+ mos PF IM (Fluarix Quad PF)  -Patient advised to return or notify health care team  if symptoms worsen ,persist or new concerns arise.  Patient Instructions   bp is elevated  Here.  Check    At home   To make sure below  140/90   Will plan referral to a sports medicine  Specialist  Regarding your knee .  Because it is getting worse and  May benefit from certain exercises.   Seems to be  Around  the knee cap area.   Knee Pain The knee is the complex joint between your thigh and your lower leg. It is made up of bones, tendons, ligaments, and cartilage. The bones that  make up the knee are:  The femur in the thigh.  The tibia and fibula in the lower leg.  The patella or kneecap riding in the groove on the lower femur. CAUSES  Knee pain is a common complaint with many causes. A few of these causes are:  Injury, such as:  A ruptured ligament or tendon injury.  Torn cartilage.  Medical conditions, such as:  Gout  Arthritis  Infections  Overuse, over training, or overdoing a physical activity. Knee pain can be minor or severe. Knee pain can accompany debilitating injury. Minor knee problems often respond well to self-care measures or get well on their own. More serious injuries may need medical intervention or even surgery. SYMPTOMS The knee is complex. Symptoms of knee problems can vary widely. Some of the problems are:  Pain with movement and weight bearing.  Swelling and tenderness.  Buckling of the knee.  Inability to straighten or extend your  knee.  Your knee locks and you cannot straighten it.  Warmth and redness with pain and fever.  Deformity or dislocation of the kneecap. DIAGNOSIS  Determining what is wrong may be very straight forward such as when there is an injury. It can also be challenging because of the complexity of the knee. Tests to make a diagnosis may include:  Your caregiver taking a history and doing a physical exam.  Routine X-rays can be used to rule out other problems. X-rays will not reveal a cartilage tear. Some injuries of the knee can be diagnosed by:  Arthroscopy a surgical technique by which a small video camera is inserted through tiny incisions on the sides of the knee. This procedure is used to examine and repair internal knee joint problems. Tiny instruments can be used during arthroscopy to repair the torn knee cartilage (meniscus).  Arthrography is a radiology technique. A contrast liquid is directly injected into the knee joint. Internal structures of the knee joint then become visible on X-ray film.  An MRI scan is a non X-ray radiology procedure in which magnetic fields and a computer produce two- or three-dimensional images of the inside of the knee. Cartilage tears are often visible using an MRI scanner. MRI scans have largely replaced arthrography in diagnosing cartilage tears of the knee.  Blood work.  Examination of the fluid that helps to lubricate the knee joint (synovial fluid). This is done by taking a sample out using a needle and a syringe. TREATMENT The treatment of knee problems depends on the cause. Some of these treatments are:  Depending on the injury, proper casting, splinting, surgery, or physical therapy care will be needed.  Give yourself adequate recovery time. Do not overuse your joints. If you begin to get sore during workout routines, back off. Slow down or do fewer repetitions.  For repetitive activities such as cycling or running, maintain your strength and  nutrition.  Alternate muscle groups. For example, if you are a weight lifter, work the upper body on one day and the lower body the next.  Either tight or weak muscles do not give the proper support for your knee. Tight or weak muscles do not absorb the stress placed on the knee joint. Keep the muscles surrounding the knee strong.  Take care of mechanical problems.  If you have flat feet, orthotics or special shoes may help. See your caregiver if you need help.  Arch supports, sometimes with wedges on the inner or outer aspect of the heel, can help. These can shift  pressure away from the side of the knee most bothered by osteoarthritis.  A brace called an "unloader" brace also may be used to help ease the pressure on the most arthritic side of the knee.  If your caregiver has prescribed crutches, braces, wraps or ice, use as directed. The acronym for this is PRICE. This means protection, rest, ice, compression, and elevation.  Nonsteroidal anti-inflammatory drugs (NSAIDs), can help relieve pain. But if taken immediately after an injury, they may actually increase swelling. Take NSAIDs with food in your stomach. Stop them if you develop stomach problems. Do not take these if you have a history of ulcers, stomach pain, or bleeding from the bowel. Do not take without your caregiver's approval if you have problems with fluid retention, heart failure, or kidney problems.  For ongoing knee problems, physical therapy may be helpful.  Glucosamine and chondroitin are over-the-counter dietary supplements. Both may help relieve the pain of osteoarthritis in the knee. These medicines are different from the usual anti-inflammatory drugs. Glucosamine may decrease the rate of cartilage destruction.  Injections of a corticosteroid drug into your knee joint may help reduce the symptoms of an arthritis flare-up. They may provide pain relief that lasts a few months. You may have to wait a few months between  injections. The injections do have a small increased risk of infection, water retention, and elevated blood sugar levels.  Hyaluronic acid injected into damaged joints may ease pain and provide lubrication. These injections may work by reducing inflammation. A series of shots may give relief for as long as 6 months.  Topical painkillers. Applying certain ointments to your skin may help relieve the pain and stiffness of osteoarthritis. Ask your pharmacist for suggestions. Many over the-counter products are approved for temporary relief of arthritis pain.  In some countries, doctors often prescribe topical NSAIDs for relief of chronic conditions such as arthritis and tendinitis. A review of treatment with NSAID creams found that they worked as well as oral medications but without the serious side effects. PREVENTION  Maintain a healthy weight. Extra pounds put more strain on your joints.  Get strong, stay limber. Weak muscles are a common cause of knee injuries. Stretching is important. Include flexibility exercises in your workouts.  Be smart about exercise. If you have osteoarthritis, chronic knee pain or recurring injuries, you may need to change the way you exercise. This does not mean you have to stop being active. If your knees ache after jogging or playing basketball, consider switching to swimming, water aerobics, or other low-impact activities, at least for a few days a week. Sometimes limiting high-impact activities will provide relief.  Make sure your shoes fit well. Choose footwear that is right for your sport.  Protect your knees. Use the proper gear for knee-sensitive activities. Use kneepads when playing volleyball or laying carpet. Buckle your seat belt every time you drive. Most shattered kneecaps occur in car accidents.  Rest when you are tired. SEEK MEDICAL CARE IF:  You have knee pain that is continual and does not seem to be getting better.  SEEK IMMEDIATE MEDICAL CARE IF:    Your knee joint feels hot to the touch and you have a high fever. MAKE SURE YOU:   Understand these instructions.  Will watch your condition.  Will get help right away if you are not doing well or get worse. Document Released: 09/26/2007 Document Revised: 02/21/2012 Document Reviewed: 09/26/2007 [facility] Patient Information 2015 [place], [place]. This information is not intended to  replace advice given to you by your health care provider. Make sure you discuss any questions you have with your health care provider.      Standley Brooking. Panosh M.D.

## 2014-08-09 ENCOUNTER — Ambulatory Visit: Payer: 59 | Admitting: Family Medicine

## 2014-08-12 ENCOUNTER — Encounter: Payer: Self-pay | Admitting: Family Medicine

## 2014-08-12 ENCOUNTER — Ambulatory Visit (INDEPENDENT_AMBULATORY_CARE_PROVIDER_SITE_OTHER): Payer: 59 | Admitting: Family Medicine

## 2014-08-12 VITALS — BP 142/84 | HR 99 | Ht 65.0 in | Wt 202.0 lb

## 2014-08-12 DIAGNOSIS — M25569 Pain in unspecified knee: Secondary | ICD-10-CM

## 2014-08-12 DIAGNOSIS — M222X9 Patellofemoral disorders, unspecified knee: Secondary | ICD-10-CM | POA: Insufficient documentation

## 2014-08-12 DIAGNOSIS — M222X2 Patellofemoral disorders, left knee: Secondary | ICD-10-CM

## 2014-08-12 MED ORDER — MELOXICAM 15 MG PO TABS
15.0000 mg | ORAL_TABLET | Freq: Every day | ORAL | Status: DC
Start: 2014-08-12 — End: 2014-10-09

## 2014-08-12 NOTE — Assessment & Plan Note (Signed)
Patient does have mild to moderate patellofemoral joint pain. Based on patient's history this is consistent with physical findings in ultrasound today. We discussed bracing, icing, and we'll do a ten-day course of anti-inflammatories. She'll patient was warned the potential side effects. Patient will do a home exercise program and we'll showed proper technique. Patient and will come back again in 3-4 weeks. Continuing to have pain we may need to consider injection as well as further imaging such as x-rays.

## 2014-08-12 NOTE — Patient Instructions (Signed)
Very nice to meet you Ice 20 minutes 2 times daily. Usually after activity and before bed. Exercises 3 times a week.  Wear brace with activity but not at night.  Meloxicam daily for 10 days then as needed Come back in 3 weeks and if still not better then come back and we will do an injection.

## 2014-08-12 NOTE — Progress Notes (Signed)
  [facility] [address] [place], [place] [address] Phone: [phone] Subjective:    I'm seeing this patient by the request  of:  Lottie Dawson, MD   CC: Left knee pain  PPI:RJJOACZYSA [name] is a 43 y.o. female coming in with complaint of left knee pain. Patient states that she has had this pain intermittently for probably one year. Patient states that the pain is mostly on the lateral aspect of the knee. Worse when going up or downstairs. Patient has no pain with sitting. Patient describes as more of a dull aching pain it does not stop her from any of her daily activities. Patient denies any radiation of pain or any weakness. Denies any nighttime awakening. Patient is a severity is 6/10 at its worse. He then states it does respond well to ibuprofen. No injuries or surgeries.     Past medical history, social, surgical and family history all reviewed in electronic medical record.   Review of Systems: No headache, visual changes, nausea, vomiting, diarrhea, constipation, dizziness, abdominal pain, skin rash, fevers, chills, night sweats, weight loss, swollen lymph nodes, body aches, joint swelling, muscle aches, chest pain, shortness of breath, mood changes.   Objective Blood pressure 142/84, pulse 99, height 5\' 5"  (1.651 m), weight 202 lb (91.627 kg), SpO2 97.00%.  General: No apparent distress alert and oriented x3 mood and affect normal, dressed appropriately.  HEENT: Pupils equal, extraocular movements intact  Respiratory: Patient's speak in full sentences and does not appear short of breath  Cardiovascular: No lower extremity edema, non tender, no erythema  Skin: Warm dry intact with no signs of infection or rash on extremities or on axial skeleton.  Abdomen: Soft nontender  Neuro: Cranial nerves II through XII are intact, neurovascularly intact in all extremities with 2+ DTRs and 2+ pulses.  Lymph: No lymphadenopathy of posterior or  anterior cervical chain or axillae bilaterally.  Gait normal with good balance and coordination.  MSK:  Non tender with full range of motion and good stability and symmetric strength and tone of shoulders, elbows, wrist, hip, and ankles bilaterally.  Knee: Left Normal to inspection with no erythema or effusion or obvious bony abnormalities. Mild superior lateral patellar pain ROM full in flexion and extension and lower leg rotation. Ligaments with solid consistent endpoints including ACL, PCL, LCL, MCL. Negative Mcmurray's, Apley's, and Thessalonian tests.  painful patellar compression with lateral tracking. Patellar glide with mild crepitus. Patellar and quadriceps tendons unremarkable. Hamstring and quadriceps strength is normal.    MSK US performed of: Left This study was ordered, performed, and interpreted by Charlann Boxer D.O.  Knee: Patient does have trace effusion of the patellofemoral joint Anteromedial, anterolateral, posteromedial, and posterolateral menisci unremarkable without tearing, fraying, effusion, or displacement. Mild narrowing of the medial joint line and moderate narrowing of the superior lateral patella femoral joint Patellar Tendon unremarkable on long and transverse views without effusion. No abnormality of prepatellar bursa. LCL and MCL unremarkable on long and transverse views. No abnormality of origin of medial or lateral head of the gastrocnemius.  IMPRESSION:  Trace knee effusion mild to moderate patellofemoral joint arthritis.    Impression and Recommendations:     This case required medical decision making of moderate complexity.

## 2014-10-09 ENCOUNTER — Encounter: Payer: Self-pay | Admitting: Family Medicine

## 2014-10-09 ENCOUNTER — Telehealth: Payer: Self-pay | Admitting: Internal Medicine

## 2014-10-09 ENCOUNTER — Ambulatory Visit (INDEPENDENT_AMBULATORY_CARE_PROVIDER_SITE_OTHER): Payer: 59 | Admitting: Family Medicine

## 2014-10-09 ENCOUNTER — Other Ambulatory Visit: Payer: Self-pay

## 2014-10-09 VITALS — BP 122/82 | HR 93 | Temp 98.1°F | Ht 65.0 in | Wt 203.8 lb

## 2014-10-09 DIAGNOSIS — N644 Mastodynia: Secondary | ICD-10-CM

## 2014-10-09 DIAGNOSIS — Z1239 Encounter for other screening for malignant neoplasm of breast: Secondary | ICD-10-CM

## 2014-10-09 NOTE — Telephone Encounter (Signed)
Patient Information:  Caller Name: Janah  Phone: [phone]  Patient: Teresa Hickman, Teresa Hickman  Gender: Female  DOB: 10-Nov-1971  Age: 43 Years  PCP: Shanon Ace [facility])  Pregnant: No  Office Follow Up:  Does the office need to follow up with this patient?: No  Instructions For The Office: N/A  RN Note:  Patient calling with c/o low back pain.  Denies injury requesting visit today.  Symptoms  Reason For Call & Symptoms: back pain  Reviewed Health History In EMR: Yes  Reviewed Medications In EMR: Yes  Reviewed Allergies In EMR: Yes  Reviewed Surgeries / Procedures: Yes  Date of Onset of Symptoms: 10/07/2014 OB / GYN:  LMP: 10/08/2014  Guideline(s) Used:  Back Pain  Disposition Per Guideline:   See Today or Tomorrow in Office  Reason For Disposition Reached:   Patient wants to be seen  Advice Given:  N/A  Patient Will Follow Care Advice:  YES  Appointment Scheduled:  10/09/2014 11:00:17 Appointment Scheduled Provider:  Maudie Mercury (TEXT 1st, after 20 mins can call), [facility])

## 2014-10-09 NOTE — Patient Instructions (Signed)
Before you leave: -schedule follow up with Dr. Regis Bill in 1 month to recheck the breast pain  -taking ibuprofen as instructed is ok  -schedule your routine mammogram

## 2014-10-09 NOTE — Telephone Encounter (Signed)
Noted  

## 2014-10-09 NOTE — Progress Notes (Signed)
No chief complaint on file.   HPI:  Breast Pain: -on and off for long time - for at least a few months -tenderness when presses on breast tissue bilaterally -she has noticed this more the last few days, just started her menstrual periods -pain is constant when occurs, lasts for a few days, relieved with ibuprofen -had mammogram once and this was normal -sometimes has pain in her back to -denies fevers, malaise, lumps in breast, no family hx of breast cancer   ROS: See pertinent positives and negatives per HPI.  Past Medical History  Diagnosis Date  . Migraine     with some aura  . Hyperlipidemia   . Dysmenorrhea     No past surgical history on file.  Family History  Problem Relation Age of Onset  . Hypertension Mother   . Diabetes Mother   . Stroke Mother     44s SD  2010    History   Social History  . Marital Status: Married    Spouse Name: N/A    Number of Children: N/A  . Years of Education: N/A   Social History Main Topics  . Smoking status: Never Smoker   . Smokeless tobacco: None  . Alcohol Use: No  . Drug Use: No  . Sexual Activity: No   Other Topics Concern  . None   Social History Narrative   From [place]   Medical Family   HH of 6    No pets   G4 p4     Current outpatient prescriptions:ibuprofen (ADVIL,MOTRIN) 200 MG tablet, Take 200 mg by mouth as needed for pain., Disp: , Rfl:   EXAM:  Filed Vitals:   10/09/14 1109  BP: 122/82  Pulse: 93  Temp: 98.1 F (36.7 C)    Body mass index is 33.91 kg/(m^2).  GENERAL: vitals reviewed and listed above, alert, oriented, appears well hydrated and in no acute distress  HEENT: atraumatic, conjunttiva clear, no obvious abnormalities on inspection of external nose and ears  NECK: no obvious masses on inspection  BREAST: normal appearance of breasts, scattered bilat symmetric mobile, mildly tender, rubbery density c/w fibrocystic changes MS: moves all extremities without noticeable  abnormality  PSYCH: pleasant and cooperative, no obvious depression or anxiety  ASSESSMENT AND PLAN:  Discussed the following assessment and plan:  Breast pain  -likely fibrocystic -advised mammogram - she reports she will call to schedule -follow up with PCP and if persists consider [place] -Patient advised to return or notify a doctor immediately if symptoms worsen or persist or new concerns arise.  Patient Instructions  Before you leave: -schedule follow up with Dr. Regis Bill in 1 month to recheck the breast pain  -taking ibuprofen as instructed is ok  -schedule your routine mammogram     Colin Benton R.

## 2014-10-09 NOTE — Progress Notes (Signed)
Pre visit review using our clinic review tool, if applicable. No additional management support is needed unless otherwise documented below in the visit note. 

## 2014-10-14 ENCOUNTER — Encounter: Payer: Self-pay | Admitting: Family Medicine

## 2014-10-21 ENCOUNTER — Encounter: Payer: Self-pay | Admitting: Internal Medicine

## 2014-10-21 ENCOUNTER — Ambulatory Visit (INDEPENDENT_AMBULATORY_CARE_PROVIDER_SITE_OTHER): Payer: 59 | Admitting: Internal Medicine

## 2014-10-21 VITALS — BP 176/96 | HR 105 | Temp 98.2°F | Wt 205.3 lb

## 2014-10-21 DIAGNOSIS — R030 Elevated blood-pressure reading, without diagnosis of hypertension: Secondary | ICD-10-CM

## 2014-10-21 DIAGNOSIS — R079 Chest pain, unspecified: Secondary | ICD-10-CM

## 2014-10-21 NOTE — Progress Notes (Signed)
Pre visit review using our clinic review tool, if applicable. No additional management support is needed unless otherwise documented below in the visit note.  Chief Complaint  Patient presents with  . Chest Discomfort    HPI: Patient Teresa Hickman  comes in today for SDA for  problem evaluation. This is been having some discomfort in her chest off and on for little bit Touchinglike pain to back  And saw dr Maudie Mercury  Pain  Mid chest and under left breast.   Waxing and waning  For day or so. No associated symptoms sometimes gets pain in the mid upper back near the scapula in the trapezius. No triggers .  mitigartors .  poss ibuprofen type meds.  counterpressure better . Massage upper back some help.  No syncope significant cough shortness of breath. Denies any serious palpitations but is nervous today and her pulse rate is up as an checked her blood pressure on her own in a while. rhythm method . lmp 10 30 for latter reasons IUD got delayed and she never went back. ROS: See pertinent positives and negatives per HPI. As an appointment for mammogram.  Past Medical History  Diagnosis Date  . Migraine     with some aura  . Hyperlipidemia   . Dysmenorrhea     Family History  Problem Relation Age of Onset  . Hypertension Mother   . Diabetes Mother   . Stroke Mother     75s SD  2010    History   Social History  . Marital Status: Married    Spouse Name: N/A    Number of Children: N/A  . Years of Education: N/A   Social History Main Topics  . Smoking status: Never Smoker   . Smokeless tobacco: None  . Alcohol Use: No  . Drug Use: No  . Sexual Activity: No   Other Topics Concern  . None   Social History Narrative   From [place]   Medical Family   HH of 6    No pets   G4 p4     Outpatient Encounter Prescriptions as of 10/21/2014  Medication Sig  . ibuprofen (ADVIL,MOTRIN) 200 MG tablet Take 200 mg by mouth as needed for pain.    EXAM:  BP 176/96 mmHg  Pulse 105   Temp(Src) 98.2 F (36.8 C) (Oral)  Wt 205 lb 4.8 oz (93.123 kg)  SpO2 99%  LMP 10/08/2014  Body mass index is 34.16 kg/(m^2). BP Readings from Last 3 Encounters:  10/21/14 176/96  10/09/14 122/82  08/12/14 142/84    GENERAL: vitals reviewed and listed above, alert, oriented, appears well hydrated and in no acute distress HEENT: atraumatic, conjunctiva  clear, no obvious abnormalities on inspection of external nose and ears OP : no lesion edema or exudate  NECK: no obvious masses on inspection palpation  LUNGS: clear to auscultation bilaterally, no wheezes, rales or rhonchi, Tender lfet CC junction t t4 or thereabouts  No other tenderness large breasts  CV: HRRR, anxiety rate 90 whiff of murmur SEM usb no radiation  no clubbing cyanosis or  peripheral edema nl cap refill  abd ;no masses tender ness MS: moves all extremities without noticeable focal  abnormality PSYCH: pleasant and cooperative, no obvious depression or midly anxious  BP 148/82 right arm large repeat   EKG shows sinus rhytym 106 no acute changes  Nonspecific flattened T waves no comparison available intervals appear to be normal.  ASSESSMENT AND PLAN:  Discussed the following  assessment and plan:  Chest pain, unspecified chest pain type - Plan: DG Chest 2 View, EKG 12-Lead  Elevated blood pressure reading - Plan: EKG 12-Lead Fasting labs  Then fu pulses elevated today and I hear very short murmur but I think this is flow from anxiety. Her blood pressure was up but on repeat down into the 148 range may well need medication for control will document persistent elevation and have her come back in with her monitor within the month. As in the meantime we can get a chest x-ray and lab tests. She should proceed with her mammogram. Her symptoms are otherwise somewhat  Vague. byu may be CWP -Patient advised to return or notify health care team  if symptoms worsen ,persist or new concerns arise.  Patient Instructions  This  acts like chest wall pain that can come and go  But  Need to make sure BP elevation is not persistent can may need tot add meidcation.  Also  plan fasting labs .  Take blood pressure readings twice a day for 7- 10 days and then periodically .To ensure below 140/90    .bring in readings and machine   Get chest x ray in the meanitme.    Standley Brooking. Noni Stonesifer M.D.

## 2014-10-21 NOTE — Patient Instructions (Addendum)
This acts like chest wall pain that can come and go  But  Need to make sure BP elevation is not persistent can may need tot add meidication.  Also  plan fasting labs .  Take blood pressure readings twice a day for 7- 10 days and then periodically .To ensure below 140/90    .bring in readings and machine   Get chest x ray in the meanitme.

## 2014-10-22 ENCOUNTER — Ambulatory Visit (INDEPENDENT_AMBULATORY_CARE_PROVIDER_SITE_OTHER)
Admission: RE | Admit: 2014-10-22 | Discharge: 2014-10-22 | Disposition: A | Discharge status: Home / Self Care | Payer: 59 | Source: Ambulatory Visit | Attending: Internal Medicine | Admitting: Internal Medicine

## 2014-10-22 DIAGNOSIS — R079 Chest pain, unspecified: Secondary | ICD-10-CM

## 2014-10-31 ENCOUNTER — Other Ambulatory Visit: Payer: Self-pay

## 2014-10-31 DIAGNOSIS — Z1231 Encounter for screening mammogram for malignant neoplasm of breast: Secondary | ICD-10-CM

## 2014-11-01 ENCOUNTER — Ambulatory Visit
Admission: RE | Admit: 2014-11-01 | Discharge: 2014-11-01 | Disposition: A | Discharge status: Home / Self Care | Payer: 59 | Source: Ambulatory Visit

## 2014-11-01 DIAGNOSIS — Z1231 Encounter for screening mammogram for malignant neoplasm of breast: Secondary | ICD-10-CM

## 2014-11-04 ENCOUNTER — Other Ambulatory Visit: Payer: Self-pay | Admitting: Internal Medicine

## 2014-11-04 DIAGNOSIS — R928 Other abnormal and inconclusive findings on diagnostic imaging of breast: Secondary | ICD-10-CM

## 2014-11-18 ENCOUNTER — Other Ambulatory Visit (INDEPENDENT_AMBULATORY_CARE_PROVIDER_SITE_OTHER): Payer: 59

## 2014-11-18 ENCOUNTER — Ambulatory Visit
Admission: RE | Admit: 2014-11-18 | Discharge: 2014-11-18 | Disposition: A | Discharge status: Home / Self Care | Payer: 59 | Source: Ambulatory Visit | Attending: Internal Medicine | Admitting: Internal Medicine

## 2014-11-18 DIAGNOSIS — Z0000 Encounter for general adult medical examination without abnormal findings: Secondary | ICD-10-CM

## 2014-11-18 DIAGNOSIS — R928 Other abnormal and inconclusive findings on diagnostic imaging of breast: Secondary | ICD-10-CM

## 2014-11-18 LAB — HEPATIC FUNCTION PANEL
ALK PHOS: 65 U/L (ref 39–117)
ALT: 17 U/L (ref 0–35)
AST: 19 U/L (ref 0–37)
Albumin: 3.8 g/dL (ref 3.5–5.2)
BILIRUBIN DIRECT: 0.1 mg/dL (ref 0.0–0.3)
Total Bilirubin: 0.6 mg/dL (ref 0.2–1.2)
Total Protein: 7.9 g/dL (ref 6.0–8.3)

## 2014-11-18 LAB — CBC WITH DIFFERENTIAL/PLATELET
BASOS ABS: 0 10*3/uL (ref 0.0–0.1)
Basophils Relative: 0.5 % (ref 0.0–3.0)
EOS ABS: 0 10*3/uL (ref 0.0–0.7)
Eosinophils Relative: 0.8 % (ref 0.0–5.0)
HCT: 40 % (ref 36.0–46.0)
Hemoglobin: 12.7 g/dL (ref 12.0–15.0)
LYMPHS PCT: 47.6 % — AB (ref 12.0–46.0)
Lymphs Abs: 2.7 10*3/uL (ref 0.7–4.0)
MCHC: 31.8 g/dL (ref 30.0–36.0)
MCV: 86.8 fl (ref 78.0–100.0)
MONO ABS: 0.4 10*3/uL (ref 0.1–1.0)
Monocytes Relative: 7.6 % (ref 3.0–12.0)
NEUTROS PCT: 43.5 % (ref 43.0–77.0)
Neutro Abs: 2.5 10*3/uL (ref 1.4–7.7)
Platelets: 300 10*3/uL (ref 150.0–400.0)
RBC: 4.61 Mil/uL (ref 3.87–5.11)
RDW: 14.3 % (ref 11.5–15.5)
WBC: 5.7 10*3/uL (ref 4.0–10.5)

## 2014-11-18 LAB — LIPID PANEL
CHOL/HDL RATIO: 5
CHOLESTEROL: 246 mg/dL — AB (ref 0–200)
HDL: 50.7 mg/dL (ref >39.00)
LDL CALC: 182 mg/dL — AB (ref 0–99)
NonHDL: 195.3
TRIGLYCERIDES: 69 mg/dL (ref 0.0–149.0)
VLDL: 13.8 mg/dL (ref 0.0–40.0)

## 2014-11-18 LAB — BASIC METABOLIC PANEL
BUN: 7 mg/dL (ref 6–23)
CALCIUM: 9.1 mg/dL (ref 8.4–10.5)
CO2: 26 mEq/L (ref 19–32)
Chloride: 104 mEq/L (ref 96–112)
Creatinine, Ser: 0.6 mg/dL (ref 0.4–1.2)
GFR: 150.67 mL/min (ref >60.00)
GLUCOSE: 84 mg/dL (ref 70–99)
Potassium: 4 mEq/L (ref 3.5–5.1)
Sodium: 138 mEq/L (ref 135–145)

## 2014-11-18 LAB — TSH: TSH: 1.64 u[IU]/mL (ref 0.35–4.50)

## 2014-11-26 ENCOUNTER — Ambulatory Visit (INDEPENDENT_AMBULATORY_CARE_PROVIDER_SITE_OTHER): Payer: 59 | Admitting: Internal Medicine

## 2014-11-26 ENCOUNTER — Encounter: Payer: Self-pay | Admitting: Internal Medicine

## 2014-11-26 VITALS — BP 176/90 | Temp 97.9°F | Ht 65.0 in | Wt 200.5 lb

## 2014-11-26 DIAGNOSIS — I10 Essential (primary) hypertension: Secondary | ICD-10-CM

## 2014-11-26 DIAGNOSIS — E785 Hyperlipidemia, unspecified: Secondary | ICD-10-CM

## 2014-11-26 DIAGNOSIS — R079 Chest pain, unspecified: Secondary | ICD-10-CM

## 2014-11-26 MED ORDER — AMLODIPINE BESYLATE 5 MG PO TABS: 5.0000 mg | ORAL_TABLET | Freq: Every day | ORAL | Status: DC

## 2014-11-26 MED ORDER — CHLORTHALIDONE 25 MG PO TABS: 25.0000 mg | ORAL_TABLET | Freq: Every day | ORAL | Status: DC

## 2014-11-26 NOTE — Progress Notes (Signed)
Pre visit review using our clinic review tool, if applicable. No additional management support is needed unless otherwise documented below in the visit note.  Chief Complaint  Patient presents with  . Follow-up    HPI: Teresa Hickman comes in for fu of atypical cp acting like cwp but with elevaetd bp  .   Log readings  Lower in 140s  Rest 150 160 range  ocass 170 range  Pulse 80.. 90 range  CP is better  And not concern about this  ROS: See pertinent positives and negatives per HPI. Cp better no other sx  Periods using rhythym method no osb syncope  Could eat a bit healthier  Mom had ht and cva  She had hx of elevated bp   On ocps    Past Medical History  Diagnosis Date  . Migraine     with some aura  . Hyperlipidemia   . Dysmenorrhea     Family History  Problem Relation Age of Onset  . Hypertension Mother   . Diabetes Mother   . Stroke Mother     20s SD  2010    History   Social History  . Marital Status: Married    Spouse Name: N/A    Number of Children: N/A  . Years of Education: N/A   Social History Main Topics  . Smoking status: Never Smoker   . Smokeless tobacco: None  . Alcohol Use: No  . Drug Use: No  . Sexual Activity: No   Other Topics Concern  . None   Social History Narrative   From [place]   Medical Family   HH of 6    No pets   G4 p4     Outpatient Encounter Prescriptions as of 11/26/2014  Medication Sig  . ibuprofen (ADVIL,MOTRIN) 200 MG tablet Take 200 mg by mouth as needed for pain.  Marland Kitchen amLODipine (NORVASC) 5 MG tablet Take 1 tablet (5 mg total) by mouth daily.  . chlorthalidone (HYGROTON) 25 MG tablet Take 1 tablet (25 mg total) by mouth daily.    EXAM:  BP 176/90 mmHg  Temp(Src) 97.9 F (36.6 C) (Oral)  Ht 5\' 5"  (1.651 m)  Wt 200 lb 8 oz (90.946 kg)  BMI 33.36 kg/m2  Body mass index is 33.36 kg/(m^2).  GENERAL: vitals reviewed and listed above, alert, oriented, appears well hydrated and in no acute distress HEENT:  atraumatic, conjunctiva  clear, no obvious abnormalities on inspection of external nose and ears PSYCH: pleasant and cooperative, no obvious depression or anxiety  reveiwed labs and   bp readings  Log  Lab Results  Component Value Date   WBC 5.7 11/18/2014   HGB 12.7 11/18/2014   HCT 40.0 11/18/2014   PLT 300.0 11/18/2014   GLUCOSE 84 11/18/2014   CHOL 246* 11/18/2014   TRIG 69.0 11/18/2014   HDL 50.70 11/18/2014   LDLDIRECT 176.4 10/09/2008   LDLCALC 182* 11/18/2014   ALT 17 11/18/2014   AST 19 11/18/2014   NA 138 11/18/2014   K 4.0 11/18/2014   CL 104 11/18/2014   CREATININE 0.6 11/18/2014   BUN 7 11/18/2014   CO2 26 11/18/2014   TSH 1.64 11/18/2014   EKG ns t wave change  Chest x ray neg.  ASSESSMENT AND PLAN:  Discussed the following assessment and plan:  Essential hypertension - confirmed home no obv underlyingfam hx ht and cva in mom:  moderate  elevation stage 2 + using rhythuym method for now will  avoid ace arb class.close fu planned  Chest pain, unspecified chest pain type - better  poss cw and not related   Hyperlipidemia - intervention idetary for now  follow  Risk factors   Consideration  of meds for lipids  If continuing   Getting  Down bp most appropriate   And if cp even atypical  Returns consider cards check .  -Patient advised to return or notify health care team  if symptoms worsen ,persist or new concerns arise.  Patient Instructions   Treating your blood pressure today is the most important intervention.  Begin diuretic  Every day and after 2 weeks if not coming down add the amlodip[ine with it . ROV in 1 month or as needed   Getting cholesterol down is also important .   Avoid animal fats , trans fats simple sugars sweets  and carbohydrates. Consider seeing dietician .        Why follow it? Research shows. . Those who follow the Mediterranean diet have a reduced risk of heart disease  . The diet is associated with a reduced incidence of  Parkinson's and Alzheimer's diseases . People following the diet may have longer life expectancies and lower rates of chronic diseases  . The Dietary Guidelines for Americans recommends the Mediterranean diet as an eating plan to promote health and prevent disease  What Is the Mediterranean Diet?  . Healthy eating plan based on typical foods and recipes of Mediterranean-style cooking . The diet is primarily a plant based diet; these foods should make up a majority of meals   Starches - Plant based foods should make up a majority of meals - They are an important sources of vitamins, minerals, energy, antioxidants, and fiber - Choose whole grains, foods high in fiber and minimally processed items  - Typical grain sources include wheat, oats, barley, corn, brown rice, bulgar, farro, millet, polenta, couscous  - Various types of beans include chickpeas, lentils, fava beans, black beans, white beans   Fruits  Veggies - Large quantities of antioxidant rich fruits & veggies; 6 or more servings  - Vegetables can be eaten raw or lightly drizzled with oil and cooked  - Vegetables common to the traditional Mediterranean Diet include: artichokes, arugula, beets, broccoli, brussel sprouts, cabbage, carrots, celery, collard greens, cucumbers, eggplant, kale, leeks, lemons, lettuce, mushrooms, okra, onions, peas, peppers, potatoes, pumpkin, radishes, rutabaga, shallots, spinach, sweet potatoes, turnips, zucchini - Fruits common to the Mediterranean Diet include: apples, apricots, avocados, cherries, clementines, dates, figs, grapefruits, grapes, melons, nectarines, oranges, peaches, pears, pomegranates, strawberries, tangerines  Fats - Replace butter and margarine with healthy oils, such as olive oil, canola oil, and tahini  - Limit nuts to no more than a handful a day  - Nuts include walnuts, almonds, pecans, pistachios, pine nuts  - Limit or avoid candied, honey roasted or heavily salted nuts - Olives are  central to the [employer] - can be eaten whole or used in a variety of dishes   Meats Protein - Limiting red meat: no more than a few times a month - When eating red meat: choose lean cuts and keep the portion to the size of deck of cards - Eggs: approx. 0 to 4 times a week  - Fish and lean poultry: at least 2 a week  - Healthy protein sources include, chicken, [place], lean beef, lamb - Increase intake of seafood such as tuna, salmon, trout, mackerel, shrimp, scallops - Avoid or limit high fat processed  meats such as sausage and bacon  Dairy - Include moderate amounts of low fat dairy products  - Focus on healthy dairy such as fat free yogurt, skim milk, low or reduced fat cheese - Limit dairy products higher in fat such as whole or 2% milk, cheese, ice cream  Alcohol - Moderate amounts of red wine is ok  - No more than 5 oz daily for women (all ages) and men older than age 86  - No more than 10 oz of wine daily for men younger than 90  Other - Limit sweets and other desserts  - Use herbs and spices instead of salt to flavor foods  - Herbs and spices common to the traditional Mediterranean Diet include: basil, bay leaves, chives, cloves, cumin, fennel, garlic, lavender, marjoram, mint, oregano, parsley, pepper, rosemary, sage, savory, sumac, tarragon, thyme   It's not just a diet, it's a lifestyle:  . The Mediterranean diet includes lifestyle factors typical of those in the region  . Foods, drinks and meals are best eaten with others and savored . Daily physical activity is important for overall good health . This could be strenuous exercise like running and aerobics . This could also be more leisurely activities such as walking, housework, yard-work, or taking the stairs . Moderation is the key; a balanced and healthy diet accommodates most foods and drinks . Consider portion sizes and frequency of consumption of certain foods   Meal Ideas & Options:  . Breakfast:  o Whole wheat  toast or whole wheat English muffins with peanut butter & hard boiled egg o Steel cut oats topped with apples & cinnamon and skim milk  o Fresh fruit: banana, strawberries, melon, berries, peaches  o Smoothies: strawberries, bananas, greek yogurt, peanut butter o Low fat greek yogurt with blueberries and granola  o Egg white omelet with spinach and mushrooms o Breakfast couscous: whole wheat couscous, apricots, skim milk, cranberries  . Sandwiches:  o Hummus and grilled vegetables (peppers, zucchini, squash) on whole wheat bread   o Grilled chicken on whole wheat pita with lettuce, tomatoes, cucumbers or tzatziki  o Tuna salad on whole wheat bread: tuna salad made with greek yogurt, olives, red peppers, capers, green onions o Garlic rosemary lamb pita: lamb sauted with garlic, rosemary, salt & pepper; add lettuce, cucumber, greek yogurt to pita - flavor with lemon juice and black pepper  . Seafood:  o Mediterranean grilled salmon, seasoned with garlic, basil, parsley, lemon juice and black pepper o Shrimp, lemon, and spinach whole-grain pasta salad made with low fat greek yogurt  o Seared scallops with lemon orzo  o Seared tuna steaks seasoned salt, pepper, coriander topped with tomato mixture of olives, tomatoes, olive oil, minced garlic, parsley, green onions and cappers  . Meats:  o Herbed greek chicken salad with kalamata olives, cucumber, feta  o Red bell peppers stuffed with spinach, bulgur, lean ground beef (or lentils) & topped with feta   o Kebabs: skewers of chicken, tomatoes, onions, zucchini, squash  o [place] burgers: made with red onions, mint, dill, lemon juice, feta cheese topped with roasted red peppers . Vegetarian o Cucumber salad: cucumbers, artichoke hearts, celery, red onion, feta cheese, tossed in olive oil & lemon juice  o Hummus and whole grain pita points with a greek salad (lettuce, tomato, feta, olives, cucumbers, red onion) o Lentil soup with celery, carrots made  with vegetable broth, garlic, salt and pepper  o Tabouli salad: parsley, bulgur, mint, scallions, cucumbers, tomato,  radishes, lemon juice, olive oil, salt and pepper.        Total visit 37mins > 50% spent counseling and coordinating care     Mantee K. Panosh M.D.

## 2014-11-26 NOTE — Patient Instructions (Signed)
Treating your blood pressure today is the most important intervention.  Begin diuretic  Every day and after 2 weeks if not coming down add the amlodip[ine with it . ROV in 1 month or as needed   Getting cholesterol down is also important .   Avoid animal fats , trans fats simple sugars sweets  and carbohydrates. Consider seeing dietician .        Why follow it? Research shows. . Those who follow the Mediterranean diet have a reduced risk of heart disease  . The diet is associated with a reduced incidence of Parkinson's and Alzheimer's diseases . People following the diet may have longer life expectancies and lower rates of chronic diseases  . The Dietary Guidelines for Americans recommends the Mediterranean diet as an eating plan to promote health and prevent disease  What Is the Mediterranean Diet?  . Healthy eating plan based on typical foods and recipes of Mediterranean-style cooking . The diet is primarily a plant based diet; these foods should make up a majority of meals   Starches - Plant based foods should make up a majority of meals - They are an important sources of vitamins, minerals, energy, antioxidants, and fiber - Choose whole grains, foods high in fiber and minimally processed items  - Typical grain sources include wheat, oats, barley, corn, brown rice, bulgar, farro, millet, polenta, couscous  - Various types of beans include chickpeas, lentils, fava beans, black beans, white beans   Fruits  Veggies - Large quantities of antioxidant rich fruits & veggies; 6 or more servings  - Vegetables can be eaten raw or lightly drizzled with oil and cooked  - Vegetables common to the traditional Mediterranean Diet include: artichokes, arugula, beets, broccoli, brussel sprouts, cabbage, carrots, celery, collard greens, cucumbers, eggplant, kale, leeks, lemons, lettuce, mushrooms, okra, onions, peas, peppers, potatoes, pumpkin, radishes, rutabaga, shallots, spinach, sweet potatoes,  turnips, zucchini - Fruits common to the Mediterranean Diet include: apples, apricots, avocados, cherries, clementines, dates, figs, grapefruits, grapes, melons, nectarines, oranges, peaches, pears, pomegranates, strawberries, tangerines  Fats - Replace butter and margarine with healthy oils, such as olive oil, canola oil, and tahini  - Limit nuts to no more than a handful a day  - Nuts include walnuts, almonds, pecans, pistachios, pine nuts  - Limit or avoid candied, honey roasted or heavily salted nuts - Olives are central to the [employer] - can be eaten whole or used in a variety of dishes   Meats Protein - Limiting red meat: no more than a few times a month - When eating red meat: choose lean cuts and keep the portion to the size of deck of cards - Eggs: approx. 0 to 4 times a week  - Fish and lean poultry: at least 2 a week  - Healthy protein sources include, chicken, [place], lean beef, lamb - Increase intake of seafood such as tuna, salmon, trout, mackerel, shrimp, scallops - Avoid or limit high fat processed meats such as sausage and bacon  Dairy - Include moderate amounts of low fat dairy products  - Focus on healthy dairy such as fat free yogurt, skim milk, low or reduced fat cheese - Limit dairy products higher in fat such as whole or 2% milk, cheese, ice cream  Alcohol - Moderate amounts of red wine is ok  - No more than 5 oz daily for women (all ages) and men older than age 67  - No more than 10 oz of wine daily for men  younger than 72  Other - Limit sweets and other desserts  - Use herbs and spices instead of salt to flavor foods  - Herbs and spices common to the traditional Mediterranean Diet include: basil, bay leaves, chives, cloves, cumin, fennel, garlic, lavender, marjoram, mint, oregano, parsley, pepper, rosemary, sage, savory, sumac, tarragon, thyme   It's not just a diet, it's a lifestyle:  . The Mediterranean diet includes lifestyle factors typical of those in  the region  . Foods, drinks and meals are best eaten with others and savored . Daily physical activity is important for overall good health . This could be strenuous exercise like running and aerobics . This could also be more leisurely activities such as walking, housework, yard-work, or taking the stairs . Moderation is the key; a balanced and healthy diet accommodates most foods and drinks . Consider portion sizes and frequency of consumption of certain foods   Meal Ideas & Options:  . Breakfast:  o Whole wheat toast or whole wheat English muffins with peanut butter & hard boiled egg o Steel cut oats topped with apples & cinnamon and skim milk  o Fresh fruit: banana, strawberries, melon, berries, peaches  o Smoothies: strawberries, bananas, greek yogurt, peanut butter o Low fat greek yogurt with blueberries and granola  o Egg white omelet with spinach and mushrooms o Breakfast couscous: whole wheat couscous, apricots, skim milk, cranberries  . Sandwiches:  o Hummus and grilled vegetables (peppers, zucchini, squash) on whole wheat bread   o Grilled chicken on whole wheat pita with lettuce, tomatoes, cucumbers or tzatziki  o Tuna salad on whole wheat bread: tuna salad made with greek yogurt, olives, red peppers, capers, green onions o Garlic rosemary lamb pita: lamb sauted with garlic, rosemary, salt & pepper; add lettuce, cucumber, greek yogurt to pita - flavor with lemon juice and black pepper  . Seafood:  o Mediterranean grilled salmon, seasoned with garlic, basil, parsley, lemon juice and black pepper o Shrimp, lemon, and spinach whole-grain pasta salad made with low fat greek yogurt  o Seared scallops with lemon orzo  o Seared tuna steaks seasoned salt, pepper, coriander topped with tomato mixture of olives, tomatoes, olive oil, minced garlic, parsley, green onions and cappers  . Meats:  o Herbed greek chicken salad with kalamata olives, cucumber, feta  o Red bell peppers stuffed  with spinach, bulgur, lean ground beef (or lentils) & topped with feta   o Kebabs: skewers of chicken, tomatoes, onions, zucchini, squash  o [place] burgers: made with red onions, mint, dill, lemon juice, feta cheese topped with roasted red peppers . Vegetarian o Cucumber salad: cucumbers, artichoke hearts, celery, red onion, feta cheese, tossed in olive oil & lemon juice  o Hummus and whole grain pita points with a greek salad (lettuce, tomato, feta, olives, cucumbers, red onion) o Lentil soup with celery, carrots made with vegetable broth, garlic, salt and pepper  o Tabouli salad: parsley, bulgur, mint, scallions, cucumbers, tomato, radishes, lemon juice, olive oil, salt and pepper.

## 2014-11-27 ENCOUNTER — Telehealth: Payer: Self-pay | Admitting: Internal Medicine

## 2014-11-27 NOTE — Telephone Encounter (Signed)
emmi emailed °

## 2014-12-27 ENCOUNTER — Encounter: Payer: Self-pay | Admitting: Internal Medicine

## 2014-12-27 ENCOUNTER — Ambulatory Visit (INDEPENDENT_AMBULATORY_CARE_PROVIDER_SITE_OTHER): Payer: 59 | Admitting: Internal Medicine

## 2014-12-27 VITALS — BP 150/82 | HR 80 | Temp 98.6°F | Wt 208.0 lb

## 2014-12-27 DIAGNOSIS — T887XXA Unspecified adverse effect of drug or medicament, initial encounter: Secondary | ICD-10-CM

## 2014-12-27 DIAGNOSIS — T50905A Adverse effect of unspecified drugs, medicaments and biological substances, initial encounter: Secondary | ICD-10-CM

## 2014-12-27 DIAGNOSIS — I10 Essential (primary) hypertension: Secondary | ICD-10-CM

## 2014-12-27 DIAGNOSIS — M79671 Pain in right foot: Secondary | ICD-10-CM | POA: Insufficient documentation

## 2014-12-27 HISTORY — DX: Essential (primary) hypertension: I10

## 2014-12-27 NOTE — Patient Instructions (Signed)
Will arrange you to see Sr. Tamala Julian about your right foot.  Need you to check BP when not feeling   Dizzy to see  If elevated  .  Take blood pressure readings twice a day for 7- 10 days  .Send in readings     Unless 120/80 range and below   I would then begin 1/2 pill of the chorthalidone   And send in readings after   3-4 weeks . Let us know if getting dizzy  (Sometimes it is adjustment to the med and goes away    .   Other times  It means the medication is too strong)  ROV in 1-2 months   Depending on how your BP is doing .

## 2014-12-27 NOTE — Progress Notes (Signed)
Chief Complaint  Patient presents with  . Follow-up    blood pressure  . Foot Pain    right    HPI: Teresa Hickman 44 y.o. comes in for fu maangement of HT elevated BP   Never took the norvasc  Took 25 hygroton and had dizziness and husband took bp readings and was 110 16 range  And felt bad and she stopped after 4 days or so .   Then felt better   Readings t dentist last week 128 range  Has no other readings to give me today .   Right foot hurting without injury  No redness  Impairs gait.  Left knee patella  Is much better after doing exercises  .      ROS: See pertinent positives and negatives per HPI.no cp sob swelling  See above   Past Medical History  Diagnosis Date  . Migraine     with some aura  . Hyperlipidemia   . Dysmenorrhea     Family History  Problem Relation Age of Onset  . Hypertension Mother   . Diabetes Mother   . Stroke Mother     28s SD  2010    History   Social History  . Marital Status: Married    Spouse Name: N/A    Number of Children: N/A  . Years of Education: N/A   Social History Main Topics  . Smoking status: Never Smoker   . Smokeless tobacco: None  . Alcohol Use: No  . Drug Use: No  . Sexual Activity: No   Other Topics Concern  . None   Social History Narrative   From [place]   Medical Family   HH of 6    No pets   G4 p4     Outpatient Encounter Prescriptions as of 12/27/2014  Medication Sig  . ibuprofen (ADVIL,MOTRIN) 200 MG tablet Take 200 mg by mouth as needed for pain.  Marland Kitchen amLODipine (NORVASC) 5 MG tablet Take 1 tablet (5 mg total) by mouth daily. (Patient not taking: Reported on 12/27/2014)  . chlorthalidone (HYGROTON) 25 MG tablet Take 1 tablet (25 mg total) by mouth daily. (Patient not taking: Reported on 12/27/2014)    EXAM:  BP 150/82 mmHg  Pulse 80  Temp(Src) 98.6 F (37 C) (Oral)  Wt 208 lb (94.348 kg)  Body mass index is 34.61 kg/(m^2).  GENERAL: vitals reviewed and listed above, alert, oriented,  appears well hydrated and in no acute distress CV: HRRR, no clubbing cyanosis or  peripheral edema nl cap refill  MS: moves all extremities without noticeable focal  Abnormality  Except right mtp area prominent edema no redness and mild tenderness  wearing low rain boots  Nl gait  PSYCH: pleasant and cooperative, no obvious depression or anxiety BP Readings from Last 3 Encounters:  12/27/14 150/82  11/26/14 176/90  10/21/14 176/96   Lab Results  Component Value Date   WBC 5.7 11/18/2014   HGB 12.7 11/18/2014   HCT 40.0 11/18/2014   PLT 300.0 11/18/2014   GLUCOSE 84 11/18/2014   CHOL 246* 11/18/2014   TRIG 69.0 11/18/2014   HDL 50.70 11/18/2014   LDLDIRECT 176.4 10/09/2008   LDLCALC 182* 11/18/2014   ALT 17 11/18/2014   AST 19 11/18/2014   NA 138 11/18/2014   K 4.0 11/18/2014   CL 104 11/18/2014   CREATININE 0.6 11/18/2014   BUN 7 11/18/2014   CO2 26 11/18/2014   TSH 1.64 11/18/2014   Wt  Readings from Last 3 Encounters:  12/27/14 208 lb (94.348 kg)  11/26/14 200 lb 8 oz (90.946 kg)  10/21/14 205 lb 4.8 oz (93.123 kg)     ASSESSMENT AND PLAN:  Discussed the following assessment and plan:  Essential hypertension - guess that still would benefoit from med  document baseline readings again and add 1/2 dose chlorthalidone see how tolerated   Medication side effect, initial encounter - suspect eitgher se of 25 mg too  much and caused low bp sx .    Right foot pain - mtp joint    no acute gout like  get Dr Tamala Julian  to see  - Plan: Ambulatory referral to Sports Medicine Still suspect HT needing intervention reviewed plan and close fu.   -Patient advised to return or notify health care team  if symptoms worsen ,persist or new concerns arise.  Patient Instructions  Will arrange you to see Sr. Tamala Julian about your right foot.  Need you to check BP when not feeling   Dizzy to see  If elevated  .  Take blood pressure readings twice a day for 7- 10 days  .Send in readings       Unless 120/80 range and below   I would then begin 1/2 pill of the chorthalidone   And send in readings after   3-4 weeks . Let us know if getting dizzy  (Sometimes it is adjustment to the med and goes away    .   Other times  It means the medication is too strong)  ROV in 1-2 months   Depending on how your BP is doing .     Standley Brooking. Amit Leece M.D.

## 2014-12-27 NOTE — Progress Notes (Signed)
Pre visit review using our clinic review tool, if applicable. No additional management support is needed unless otherwise documented below in the visit note. 

## 2015-01-08 ENCOUNTER — Ambulatory Visit: Payer: 59 | Admitting: Family Medicine

## 2015-01-09 ENCOUNTER — Ambulatory Visit (INDEPENDENT_AMBULATORY_CARE_PROVIDER_SITE_OTHER): Payer: 59 | Admitting: Family Medicine

## 2015-01-09 ENCOUNTER — Encounter: Payer: Self-pay | Admitting: Internal Medicine

## 2015-01-09 ENCOUNTER — Encounter: Payer: Self-pay | Admitting: Family Medicine

## 2015-01-09 ENCOUNTER — Other Ambulatory Visit (INDEPENDENT_AMBULATORY_CARE_PROVIDER_SITE_OTHER): Payer: 59

## 2015-01-09 VITALS — BP 108/82 | HR 82 | Ht 62.5 in | Wt 204.0 lb

## 2015-01-09 DIAGNOSIS — M79671 Pain in right foot: Secondary | ICD-10-CM

## 2015-01-09 DIAGNOSIS — S92313A Displaced fracture of first metatarsal bone, unspecified foot, initial encounter for closed fracture: Secondary | ICD-10-CM | POA: Insufficient documentation

## 2015-01-09 DIAGNOSIS — S92311A Displaced fracture of first metatarsal bone, right foot, initial encounter for closed fracture: Secondary | ICD-10-CM

## 2015-01-09 NOTE — Patient Instructions (Signed)
Very nice to meet you You have a fracture.  Ice 20 minutes 2 times daily. Usually after activity and before bed. Wear boot whenever walking for the next 2 weeks.  Vitamin D 2000 IU daily Try the pennsaid topically 2 times a day if you need for pain See me again in 2 weeks.

## 2015-01-09 NOTE — Progress Notes (Signed)
  [facility] [address] [place], [place] [address] Phone: [phone] Subjective:    I'm seeing this patient by the request  of:  Lottie Dawson, MD   CC: right foot pain  [id number] Teresa Hickman is a 44 y.o. female coming in with complaint of right foot pain. Patient states most of the pain seems to be located over the first MTP joint. Patient has had this pain      Past medical history, social, surgical and family history all reviewed in electronic medical record.   Review of Systems: No headache, visual changes, nausea, vomiting, diarrhea, constipation, dizziness, abdominal pain, skin rash, fevers, chills, night sweats, weight loss, swollen lymph nodes, body aches, joint swelling, muscle aches, chest pain, shortness of breath, mood changes.   Objective Blood pressure 108/82, pulse 82, height 5' 2.5" (1.588 m), weight 204 lb (92.534 kg), SpO2 98 %.  General: No apparent distress alert and oriented x3 mood and affect normal, dressed appropriately.  HEENT: Pupils equal, extraocular movements intact  Respiratory: Patient's speak in full sentences and does not appear short of breath  Cardiovascular: No lower extremity edema, non tender, no erythema  Skin: Warm dry intact with no signs of infection or rash on extremities or on axial skeleton.  Abdomen: Soft nontender  Neuro: Cranial nerves II through XII are intact, neurovascularly intact in all extremities with 2+ DTRs and 2+ pulses.  Lymph: No lymphadenopathy of posterior or anterior cervical chain or axillae bilaterally.  Gait normal with good balance and coordination.  MSK:  Non tender with full range of motion and good stability and symmetric strength and tone of shoulders, elbows, wrist, hip, knee and ankles bilaterally.   Foot exam shows the patient does have fairly narrow feet overall. Patient's right foot is tender to palpation over the first metatarsal joint. There is what appears to  be as soft bunion formation over the right toe. Neurovascular intact distally. Good range of motion. Patient's tenderness seems to be specifically just proximal to the joint.  Limited [place] skeletal ultrasound was performed and interpreted by Hulan Saas, M  Limited ultrasound of the first metatarsal shows the patient does have a fracture just proximal to this area. Patient does have some mild callus and is nondisplaced. Capsulitis noted as well.  Impression: Metatarsal fracture nondisplaced of the first MTP with capsulitis    Impression and Recommendations:     This case required medical decision making of moderate complexity.

## 2015-01-09 NOTE — Assessment & Plan Note (Signed)
Fracture noted, put in post op boot, we discussed icing regimen and patient given topical anti-inflammatories. Patient will avoid any significant activities at this time. She'll come back in 2 weeks to make sure that she is healing appropriately.

## 2015-01-09 NOTE — Progress Notes (Signed)
Pre visit review using our clinic review tool, if applicable. No additional management support is needed unless otherwise documented below in the visit note. 

## 2015-01-24 ENCOUNTER — Other Ambulatory Visit (INDEPENDENT_AMBULATORY_CARE_PROVIDER_SITE_OTHER): Payer: 59

## 2015-01-24 ENCOUNTER — Ambulatory Visit (INDEPENDENT_AMBULATORY_CARE_PROVIDER_SITE_OTHER): Payer: 59 | Admitting: Family Medicine

## 2015-01-24 ENCOUNTER — Encounter: Payer: Self-pay | Admitting: Family Medicine

## 2015-01-24 VITALS — BP 134/86 | HR 100 | Ht 62.5 in | Wt 200.0 lb

## 2015-01-24 DIAGNOSIS — M79671 Pain in right foot: Secondary | ICD-10-CM

## 2015-01-24 DIAGNOSIS — S92311A Displaced fracture of first metatarsal bone, right foot, initial encounter for closed fracture: Secondary | ICD-10-CM

## 2015-01-24 NOTE — Progress Notes (Signed)
  [facility] [address] [place], [place] [address] Phone: [phone] Subjective:    I'm seeing this patient by the request  of:  Lottie Dawson, MD   CC: right foot pain  [id number] Teresa Hickman is a 44 y.o. female coming in with complaint of right foot pain. Patient states most of the pain seems to be located over the first MTP joint. Patient has had this pain for quite some time. Patient was found to have actually a fracture of the first metatarsal. Patient also had a reactive capsulitis. Patient was put in a [employer]. We discussed icing regimen. Patient was, try some over-the-counter vitamin D supplementation to help with healing. Patient states at the pain is decreased. Patient states that she's been walking only in the boot. Denies any new symptoms.     Past medical history, social, surgical and family history all reviewed in electronic medical record.   Review of Systems: No headache, visual changes, nausea, vomiting, diarrhea, constipation, dizziness, abdominal pain, skin rash, fevers, chills, night sweats, weight loss, swollen lymph nodes, body aches, joint swelling, muscle aches, chest pain, shortness of breath, mood changes.   Objective Blood pressure 134/86, pulse 100, height 5' 2.5" (1.588 m), weight 200 lb (90.719 kg), SpO2 99 %.  General: No apparent distress alert and oriented x3 mood and affect normal, dressed appropriately.  HEENT: Pupils equal, extraocular movements intact  Respiratory: Patient's speak in full sentences and does not appear short of breath  Cardiovascular: No lower extremity edema, non tender, no erythema  Skin: Warm dry intact with no signs of infection or rash on extremities or on axial skeleton.  Abdomen: Soft nontender  Neuro: Cranial nerves II through XII are intact, neurovascularly intact in all extremities with 2+ DTRs and 2+ pulses.  Lymph: No lymphadenopathy of posterior or anterior cervical chain or  axillae bilaterally.  Gait normal with good balance and coordination.  MSK:  Non tender with full range of motion and good stability and symmetric strength and tone of shoulders, elbows, wrist, hip, knee and ankles bilaterally.   Foot exam shows the patient does have fairly narrow feet overall. Patient's right foot is tender to palpation over the first metatarsal joint but improved from previous exam.. There is what appears to be as soft bunion formation over the right toe. Unless severe than previously. Patient is still neurovascularly intact.  Limited [place] skeletal ultrasound was performed and interpreted by Hulan Saas, M  Limited ultrasound of the first metatarsal shows the patient does have a fracture still noted. Patient does have callus formation but only in cross 75% of the fracture at this time. Capsulitis is improved.  Impression: 75% healing of the metatarsal fracture with decrease capsulitis    Impression and Recommendations:     This case required medical decision making of moderate complexity.

## 2015-01-24 NOTE — Assessment & Plan Note (Signed)
Patient is improving but slowly. I do think the patient has another 2 weeks of healing. Encourage her to continue the boot at this time. We also discussed still icing. Once patient is pain free she may be a candidate for custom orthotics. We'll discuss this at follow-up.  Spent  25 minutes with patient face-to-face and had greater than 50% of counseling including as described above in assessment and plan.

## 2015-01-24 NOTE — Progress Notes (Signed)
Pre visit review using our clinic review tool, if applicable. No additional management support is needed unless otherwise documented below in the visit note. 

## 2015-01-24 NOTE — Patient Instructions (Addendum)
Good to see you Ice is your friend still  OK to be in a shoe at home but continue the boot for another 2 weeks   Vitamin D 4000IU daily for 1 week then back to 2000 IU daily.  See me in 2 weeks and hopefully we can get you out of the boot entirely.

## 2015-02-07 ENCOUNTER — Encounter: Payer: Self-pay | Admitting: Family Medicine

## 2015-02-07 ENCOUNTER — Ambulatory Visit (INDEPENDENT_AMBULATORY_CARE_PROVIDER_SITE_OTHER): Payer: 59 | Admitting: Family Medicine

## 2015-02-07 VITALS — BP 122/84 | HR 87 | Ht 62.5 in | Wt 205.0 lb

## 2015-02-07 DIAGNOSIS — M222X2 Patellofemoral disorders, left knee: Secondary | ICD-10-CM

## 2015-02-07 DIAGNOSIS — S92311A Displaced fracture of first metatarsal bone, right foot, initial encounter for closed fracture: Secondary | ICD-10-CM

## 2015-02-07 NOTE — Assessment & Plan Note (Signed)
Continues improving but is very slow. Patient is going to now try to transition into the shoe if she can tolerate it. Patient is 4 weeks out from the injury. If patient continues to have difficulty we may need to get labs to rule out uric acid elevation. Patient may also need to have a capsular injection. Patient's bone though is approximately 85-90% healed. Patient will come back 1 more time in 3 weeks to make sure completely healed.

## 2015-02-07 NOTE — Patient Instructions (Signed)
Good to see you Ice is your friend only when needed The toe is near healed, ok to start wearing regular shoes again but do not lace last eye,  For the knee start the pennsaid twice daily Wear the brace with activity Start the exercises in the handout again 3 times a week See me again in 3 weeks and if toe or knee not perfect we will do injection.

## 2015-02-07 NOTE — Progress Notes (Signed)
  [facility] [address] [place], [place] [address] Phone: [phone] Subjective:    I'm seeing this patient by the request  of:  Lottie Dawson, MD   CC: right foot pain  [id number] Teresa Hickman is a 44 y.o. female coming in with complaint of right foot pain. Patient states most of the pain seems to be located over the first MTP joint. Patient continued in a Cam Walker for another 2 weeks for total of 4 weeks. Patient was to transition into her shoe. Patient states the pain is feeling significantly better. Patient was having bilateral anterior knee pain and does have patellofemoral syndrome and states that her left side is seems to be worsening. Patient thinks that this is likely because she is compensating for her foot. Patient has wore shoes recently.     Past medical history, social, surgical and family history all reviewed in electronic medical record.   Review of Systems: No headache, visual changes, nausea, vomiting, diarrhea, constipation, dizziness, abdominal pain, skin rash, fevers, chills, night sweats, weight loss, swollen lymph nodes, body aches, joint swelling, muscle aches, chest pain, shortness of breath, mood changes.   Objective Blood pressure 122/84, pulse 87, height 5' 2.5" (1.588 m), weight 205 lb (92.987 kg), SpO2 93 %.  General: No apparent distress alert and oriented x3 mood and affect normal, dressed appropriately.  HEENT: Pupils equal, extraocular movements intact  Respiratory: Patient's speak in full sentences and does not appear short of breath  Cardiovascular: No lower extremity edema, non tender, no erythema  Skin: Warm dry intact with no signs of infection or rash on extremities or on axial skeleton.  Abdomen: Soft nontender  Neuro: Cranial nerves II through XII are intact, neurovascularly intact in all extremities with 2+ DTRs and 2+ pulses.  Lymph: No lymphadenopathy of posterior or anterior cervical chain or  axillae bilaterally.  Gait normal with good balance and coordination.  MSK:  Non tender with full range of motion and good stability and symmetric strength and tone of shoulders, elbows, wrist, hip, knee and ankles bilaterally.   Foot exam shows the patient does have fairly narrow feet overall. Significantly decreased tenderness over the first metatarsal compared to previous exam... There is what appears to be as soft bunion formation over the right toe. Unless severe than previously. Patient is still neurovascularly intact.  Limited [place] skeletal ultrasound was performed and interpreted by Hulan Saas, M  Limited ultrasound of the first metatarsal shows the patient does have a fracture still noted. Patient does have callus formation but only in cross 85% of the fracture at this time. Capsulitis is worse from previous exam  Impression: 85% healed the patient does have worse capsulitis with questionable uric acid deposits    Impression and Recommendations:     This case required medical decision making of moderate complexity.

## 2015-02-07 NOTE — Progress Notes (Signed)
Pre visit review using our clinic review tool, if applicable. No additional management support is needed unless otherwise documented below in the visit note. 

## 2015-02-07 NOTE — Assessment & Plan Note (Signed)
Patient given home exercises on a regular basis.  Patellofemoral Syndrome  Reviewed anatomy using anatomical model and how PFS occurs.  Given rehab exercises handout for VMO, hip abductors, core, entire kinetic chain including proprioception exercises including cone touches, step downs, hip elevations and turn outs.  Could benefit from PT, regular exercise, upright biking, and a PFS knee brace to assist with tracking abnormalities.  f/yu in 3 weeks.

## 2015-02-20 ENCOUNTER — Ambulatory Visit (INDEPENDENT_AMBULATORY_CARE_PROVIDER_SITE_OTHER): Payer: 59 | Admitting: Internal Medicine

## 2015-02-20 ENCOUNTER — Encounter: Payer: Self-pay | Admitting: Internal Medicine

## 2015-02-20 VITALS — BP 162/88 | HR 84 | Temp 99.3°F | Wt 203.7 lb

## 2015-02-20 DIAGNOSIS — T50905A Adverse effect of unspecified drugs, medicaments and biological substances, initial encounter: Secondary | ICD-10-CM

## 2015-02-20 DIAGNOSIS — I10 Essential (primary) hypertension: Secondary | ICD-10-CM

## 2015-02-20 DIAGNOSIS — T887XXA Unspecified adverse effect of drug or medicament, initial encounter: Secondary | ICD-10-CM

## 2015-02-20 MED ORDER — HYDROCHLOROTHIAZIDE 25 MG PO TABS: 12.5000 mg | ORAL_TABLET | Freq: Every day | ORAL | Status: DC

## 2015-02-20 NOTE — Progress Notes (Signed)
Chief Complaint  Patient presents with  . 2 month follow up    HPI: Teresa Hickman 44 y.o. comes in for fu of her BP managments  Had elevations but then low bp on meds  she did send in a message about low blood pressure feeling dizzy and headaches on 12.5 mg of chlorthalidone  1/2 pill hygroton  however never received a message. She stopped it. Blood pressure since then been about 140 or low 140s. Dizziness and headache is gone. Foot fracture mt under care dr Tamala Julian no regular anti-inflammatories ROS: See pertinent positives and negatives per HPI.  Past Medical History  Diagnosis Date  . Migraine     with some aura  . Hyperlipidemia   . Dysmenorrhea   . Essential hypertension 12/27/2014    Family History  Problem Relation Age of Onset  . Hypertension Mother   . Diabetes Mother   . Stroke Mother     41s SD  2010    History   Social History  . Marital Status: Married    Spouse Name: N/A  . Number of Children: N/A  . Years of Education: N/A   Social History Main Topics  . Smoking status: Never Smoker   . Smokeless tobacco: Not on file  . Alcohol Use: No  . Drug Use: No  . Sexual Activity: No   Other Topics Concern  . Not on file   Social History Narrative   From [place]   Medical Family   HH of 6    No pets   G4 p4     Outpatient Encounter Prescriptions as of 02/20/2015  Medication Sig  . hydrochlorothiazide (HYDRODIURIL) 25 MG tablet Take 0.5 tablets (12.5 mg total) by mouth daily.  Marland Kitchen ibuprofen (ADVIL,MOTRIN) 200 MG tablet Take 200 mg by mouth as needed for pain.  . [DISCONTINUED] amLODipine (NORVASC) 5 MG tablet Take 1 tablet (5 mg total) by mouth daily. (Patient not taking: Reported on 02/20/2015)  . [DISCONTINUED] chlorthalidone (HYGROTON) 25 MG tablet Take 1 tablet (25 mg total) by mouth daily. (Patient not taking: Reported on 02/20/2015)    EXAM:  BP 162/88 mmHg  Pulse 84  Temp(Src) 99.3 F (37.4 C) (Oral)  Wt 203 lb 11.2 oz (92.398 kg)  SpO2  98%  Body mass index is 36.64 kg/(m^2).  GENERAL: vitals reviewed and listed above, alert, oriented, appears well hydrated and in no acute distress MS: moves all extremities without noticeable focal  abnormality prominent right MTP boot is off tenderness around the area PSYCH: pleasant and cooperative, no obvious depression or anxiety Lab Results  Component Value Date   WBC 5.7 11/18/2014   HGB 12.7 11/18/2014   HCT 40.0 11/18/2014   PLT 300.0 11/18/2014   GLUCOSE 84 11/18/2014   CHOL 246* 11/18/2014   TRIG 69.0 11/18/2014   HDL 50.70 11/18/2014   LDLDIRECT 176.4 10/09/2008   LDLCALC 182* 11/18/2014   ALT 17 11/18/2014   AST 19 11/18/2014   NA 138 11/18/2014   K 4.0 11/18/2014   CL 104 11/18/2014   CREATININE 0.6 11/18/2014   BUN 7 11/18/2014   CO2 26 11/18/2014   TSH 1.64 11/18/2014   BP Readings from Last 3 Encounters:  02/20/15 162/88  02/07/15 122/84  01/24/15 134/86    ASSESSMENT AND PLAN:  Discussed the following assessment and plan:  No diagnosis found.  Side effects from chlorthalidone even at half dose probably related to hypotension however could have some dizziness idiopathic. She probably  will tolerate low-dose HCTZ 25 mg HCTZ to take half a day let us know how she is doing in readings in 3-4 weeks otherwise are OV in 2-3 months can check electrolytes at that point.  We certainly don't want her to feel bad and be hypotensive to get her blood pressure into the goal range.  She will be following up about her foot occult fracture this week.  -Patient advised to return or notify health care team  if symptoms worsen ,persist or new concerns arise.  Patient Instructions  Begin low dose hctz   12.5  Per day   Again send in readings in  3-4 weeks. And then decide if going to stay on this.  If ok then plan ROV in 3 months with labs      [place] K. Panosh M.D.  Pre visit review using our clinic review tool, if applicable. No additional management support is  needed unless otherwise documented below in the visit note.

## 2015-02-20 NOTE — Patient Instructions (Signed)
Begin low dose hctz   12.5  Per day   Again send in readings in  3-4 weeks. And then decide if going to stay on this.  If ok then plan ROV in 3 months with labs

## 2015-02-28 ENCOUNTER — Ambulatory Visit (INDEPENDENT_AMBULATORY_CARE_PROVIDER_SITE_OTHER): Payer: 59 | Admitting: Family Medicine

## 2015-02-28 ENCOUNTER — Ambulatory Visit (INDEPENDENT_AMBULATORY_CARE_PROVIDER_SITE_OTHER)
Admission: RE | Admit: 2015-02-28 | Discharge: 2015-02-28 | Disposition: A | Discharge status: Home / Self Care | Payer: 59 | Source: Ambulatory Visit | Attending: Family Medicine | Admitting: Family Medicine

## 2015-02-28 ENCOUNTER — Encounter: Payer: Self-pay | Admitting: Family Medicine

## 2015-02-28 VITALS — BP 132/82 | HR 78 | Ht 62.5 in | Wt 202.0 lb

## 2015-02-28 DIAGNOSIS — M25562 Pain in left knee: Secondary | ICD-10-CM

## 2015-02-28 DIAGNOSIS — M222X2 Patellofemoral disorders, left knee: Secondary | ICD-10-CM

## 2015-02-28 DIAGNOSIS — M79671 Pain in right foot: Secondary | ICD-10-CM

## 2015-02-28 NOTE — Assessment & Plan Note (Signed)
Foot pain is much better this time. Patient will be fitted for custom orthotics in the near future. Encourage patient to continue to increase activity as tolerated. Patient will see me again in 4 weeks to make sure completely resolved and patient is wearing the orthotics appropriately.

## 2015-02-28 NOTE — Patient Instructions (Signed)
Good to see you Wear regular shoes I am hoping with your foot feeling better your knee will start feeling better as well.  We will get you in orthotics soon.  Continue the exercises and icing We will get xrays of the knee today on your way out See me again in 4 weeks.

## 2015-02-28 NOTE — Assessment & Plan Note (Signed)
Patient given an injection today and tolerated the procedure well with complete resolution of pain. We discussed home exercises again wearing the brace fairly regular basis. I'm hoping the patient does improve fairly significantly and quickly. Patient does not make any significant improvement advance imaging may be necessary.  Spent  25 minutes with patient face-to-face and had greater than 50% of counseling including as described above in assessment and plan.

## 2015-02-28 NOTE — Progress Notes (Signed)
Pre visit review using our clinic review tool, if applicable. No additional management support is needed unless otherwise documented below in the visit note. 

## 2015-02-28 NOTE — Progress Notes (Signed)
  [name] [address] [place], [place] [address] Phone: [phone] Subjective:    I'm seeing this patient by the request  of:  Lottie Dawson, MD   CC: right foot pain  [id number] Teresa Hickman is a 44 y.o. female coming in with complaint of right foot pain. Patient states most of the pain seems to be located over the first MTP joint. Patient continued in a [employer] Walker for another 2 weeks for total of 4 weeks. Patient was to transition into her shoe. Patient states the pain is feeling significantly better. Patient was having bilateral anterior knee pain and does have patellofemoral syndrome and states that her left side is seems to be worsening. Patient thinks that this is likely because she is compensating for her foot. Patient has wore shoes recently.     Past medical history, social, surgical and family history all reviewed in electronic medical record.   Review of Systems: No headache, visual changes, nausea, vomiting, diarrhea, constipation, dizziness, abdominal pain, skin rash, fevers, chills, night sweats, weight loss, swollen lymph nodes, body aches, joint swelling, muscle aches, chest pain, shortness of breath, mood changes.   Objective Blood pressure 132/82, pulse 78, height 5' 2.5" (1.588 m), weight 202 lb (91.627 kg), SpO2 99 %.  General: No apparent distress alert and oriented x3 mood and affect normal, dressed appropriately.  HEENT: Pupils equal, extraocular movements intact  Respiratory: Patient's speak in full sentences and does not appear short of breath  Cardiovascular: No lower extremity edema, non tender, no erythema  Skin: Warm dry intact with no signs of infection or rash on extremities or on axial skeleton.  Abdomen: Soft nontender  Neuro: Cranial nerves II through XII are intact, neurovascularly intact in all extremities with 2+ DTRs and 2+ pulses.  Lymph: No lymphadenopathy of posterior or anterior cervical chain or  axillae bilaterally.  Gait normal with good balance and coordination.  MSK:  Non tender with full range of motion and good stability and symmetric strength and tone of shoulders, elbows, wrist, hip, and ankles bilaterally.  Knee: Left Normal to inspection with no erythema or effusion or obvious bony abnormalities. Mild lateral tenderness with lateral pain over the superior lateral aspect of the patella. ROM full in flexion and extension and lower leg rotation. Ligaments with solid consistent endpoints including ACL, PCL, LCL, MCL. Negative Mcmurray's, Apley's, and Thessalonian tests. Non painful patellar compression. Patellar glide with moderate crepitus Patellar and quadriceps tendons unremarkable. Hamstring and quadriceps strength is normal.    Foot exam shows the patient does have fairly narrow feet overall. Significantly decreased tenderness over the first metatarsal compared to previous exam... There is what appears to be as soft bunion formation over the right toe. Unless severe than previously. Patient is still neurovascularly intact.  Limited [place] skeletal ultrasound was performed and interpreted by Hulan Saas, M  Limited ultrasound of the first metatarsal shows the patient does have a fracture still noted. No fracture noted anymore at this time.. Capsulitis resolved as well.  Impression: Healed fracture.  After informed written and verbal consent, patient was seated on exam table. Left knee was prepped with alcohol swab and utilizing anterolateral approach, patient's left knee space was injected with 4:1  marcaine 0.5%: Kenalog 40mg /dL. Patient tolerated the procedure well without immediate complications.    Impression and Recommendations:     This case required medical decision making of moderate complexity.

## 2015-03-11 ENCOUNTER — Ambulatory Visit (INDEPENDENT_AMBULATORY_CARE_PROVIDER_SITE_OTHER): Payer: 59 | Admitting: Family Medicine

## 2015-03-11 DIAGNOSIS — S92311A Displaced fracture of first metatarsal bone, right foot, initial encounter for closed fracture: Secondary | ICD-10-CM

## 2015-03-11 NOTE — Assessment & Plan Note (Signed)
Patient overall is doing relatively well. Patient was given orthotics today and will slowly increase. We discussed continuing the other conservative therapy and patient will follow-up again in 2-4 weeks for further evaluation and treatment.

## 2015-03-11 NOTE — Progress Notes (Signed)
Patient was fitted for a : standard, cushioned, semi-rigid orthotic. The orthotic was heated and afterward the patient was in a seated position and the orthotic molded. The patient was positioned in subtalar neutral position and 10 degrees of ankle dorsiflexion in a non-weight bearing stance. After completion of molding, patient did have orthotic management which included instructions on acclimating to the orthotics, signs of ill fit as well as care for the orthotic.   The blank was ground to a stable position for weight bearing. Size: 7.5 (Igli Control)  Base: Carbon fiber Additional Posting and Padding: The following postings were fitted onto the molded orthotics to help maintain a talar neutral position - Wedge posting for transverse arch: None    Silicone posting for longitudinal arch: 250/100 The patient ambulated these, and they were very comfortable and supportive.

## 2015-03-11 NOTE — Patient Instructions (Signed)

## 2015-03-27 ENCOUNTER — Encounter: Payer: Self-pay | Admitting: Internal Medicine

## 2015-03-31 ENCOUNTER — Encounter: Payer: Self-pay | Admitting: Family Medicine

## 2015-03-31 ENCOUNTER — Ambulatory Visit (INDEPENDENT_AMBULATORY_CARE_PROVIDER_SITE_OTHER): Payer: 59 | Admitting: Family Medicine

## 2015-03-31 VITALS — BP 138/90 | HR 88 | Ht 62.5 in | Wt 204.0 lb

## 2015-03-31 DIAGNOSIS — S92311A Displaced fracture of first metatarsal bone, right foot, initial encounter for closed fracture: Secondary | ICD-10-CM | POA: Diagnosis not present

## 2015-03-31 NOTE — Progress Notes (Signed)
Pre visit review using our clinic review tool, if applicable. No additional management support is needed unless otherwise documented below in the visit note. 

## 2015-03-31 NOTE — Patient Instructions (Signed)
Good to see you Keep that skin on bottom of foot soft Try to file the area and then apply lotion at least daily Continue the orthotics See me when you need me.

## 2015-03-31 NOTE — Progress Notes (Signed)
  [name] [address] [place], [place] [address] Phone: [phone] Subjective:    I'm seeing this patient by the request  of:  Lottie Dawson, MD   CC: right foot pain  [id number] Teresa Hickman is a 44 y.o. female coming in with complaint of right foot pain. Patient states most of the pain seems to be located over the first MTP joint. P patient was placed in custom orthotics. Patient states that she is doing very well. Still some mild soreness at the end of a long day but otherwise fairly unremarkable. Patient denies any numbness or tingling. Denies any new symptoms. Patient is fairly happy with the results.     Past medical history, social, surgical and family history all reviewed in electronic medical record.   Review of Systems: No headache, visual changes, nausea, vomiting, diarrhea, constipation, dizziness, abdominal pain, skin rash, fevers, chills, night sweats, weight loss, swollen lymph nodes, body aches, joint swelling, muscle aches, chest pain, shortness of breath, mood changes.   Objective Blood pressure 138/90, pulse 88, height 5' 2.5" (1.588 m), weight 204 lb (92.534 kg), SpO2 99 %.  General: No apparent distress alert and oriented x3 mood and affect normal, dressed appropriately.  HEENT: Pupils equal, extraocular movements intact  Respiratory: Patient's speak in full sentences and does not appear short of breath  Cardiovascular: No lower extremity edema, non tender, no erythema  Skin: Warm dry intact with no signs of infection or rash on extremities or on axial skeleton.  Abdomen: Soft nontender  Neuro: Cranial nerves II through XII are intact, neurovascularly intact in all extremities with 2+ DTRs and 2+ pulses.  Lymph: No lymphadenopathy of posterior or anterior cervical chain or axillae bilaterally.  Gait normal with good balance and coordination.  MSK:  Non tender with full range of motion and good stability and symmetric  strength and tone of shoulders, elbows, wrist, hip, knee and ankles bilaterally.   Foot exam shows the patient does have fairly narrow feet overall. Significantly decreased tenderness over the first metatarsal compared to previous exam.. Significant callus formation noted on the plantar aspect of the first toe. Still some mild hallux rigidus noted. Nontender on exam.  Procedure Note.  After verbal consent patient did have a 10 blade to the callus formation noted on the plantar aspect of the foot. This was removed. Patient will the procedure well with no pain. Post debridement instructions given.    Impression and Recommendations:     This case required medical decision making of moderate complexity.

## 2015-03-31 NOTE — Assessment & Plan Note (Signed)
Well healed at this time. Patient continued the custom orthotics. Patient will try to avoid any significant callus formation noted over the first metatarsal on the plantar aspect and we discussed lotion as well as debridement. Patient will follow-up on an as-needed basis.

## 2015-04-08 ENCOUNTER — Telehealth: Payer: Self-pay | Admitting: Internal Medicine

## 2015-04-08 NOTE — Telephone Encounter (Signed)
Pt  Would like to know should she continue taking half on 25 mg hctz or should she take 25 mg pill. Pt is aware md out of office until wednesday

## 2015-04-09 NOTE — Telephone Encounter (Signed)
Tried reaching the pt on home phone.  Received a busy signal.  Will try again at a later time.

## 2015-04-10 ENCOUNTER — Encounter: Payer: Self-pay | Admitting: Family Medicine

## 2015-04-10 ENCOUNTER — Other Ambulatory Visit: Payer: Self-pay | Admitting: Family Medicine

## 2015-04-10 DIAGNOSIS — I10 Essential (primary) hypertension: Secondary | ICD-10-CM

## 2015-04-10 NOTE — Telephone Encounter (Signed)
Spoke to the pt.  She is currently taking 1/2 tab.  See pt email from 03/27/15 in her chart.  Contains bp readings.

## 2015-04-10 NOTE — Telephone Encounter (Signed)
Spoke to the pt.  Informed her to stay on half tab of hctz.  Will come for lab work on 05/13/15 @ 10:30 AM for lab work.  Has future appt on 05/22/15.

## 2015-04-10 NOTE — Telephone Encounter (Signed)
Stay on the 12.5 hctz at this time   Check bmp in may  ( dx ht)  If bp still controlled then continue  And  And keep fu  Plan for 3-4 months

## 2015-04-10 NOTE — Telephone Encounter (Signed)
Lab order placed in the system.

## 2015-04-10 NOTE — Telephone Encounter (Signed)
Spoke to [employer] and he informed me that the pt is not at home.  He will have her return my call when she comes in.

## 2015-04-14 ENCOUNTER — Emergency Department (HOSPITAL_COMMUNITY)
Admission: EM | Admit: 2015-04-14 | Discharge: 2015-04-14 | Disposition: A | Discharge status: Home / Self Care | Payer: 59 | Attending: Emergency Medicine | Admitting: Emergency Medicine

## 2015-04-14 ENCOUNTER — Encounter (HOSPITAL_COMMUNITY): Payer: Self-pay | Admitting: Emergency Medicine

## 2015-04-14 ENCOUNTER — Emergency Department (HOSPITAL_COMMUNITY): Payer: 59

## 2015-04-14 DIAGNOSIS — I10 Essential (primary) hypertension: Secondary | ICD-10-CM | POA: Insufficient documentation

## 2015-04-14 DIAGNOSIS — R0602 Shortness of breath: Secondary | ICD-10-CM | POA: Insufficient documentation

## 2015-04-14 DIAGNOSIS — Z87448 Personal history of other diseases of urinary system: Secondary | ICD-10-CM | POA: Diagnosis not present

## 2015-04-14 DIAGNOSIS — R079 Chest pain, unspecified: Secondary | ICD-10-CM | POA: Diagnosis not present

## 2015-04-14 DIAGNOSIS — Z8639 Personal history of other endocrine, nutritional and metabolic disease: Secondary | ICD-10-CM | POA: Diagnosis not present

## 2015-04-14 DIAGNOSIS — Z79899 Other long term (current) drug therapy: Secondary | ICD-10-CM | POA: Diagnosis not present

## 2015-04-14 LAB — COMPREHENSIVE METABOLIC PANEL
ALT: 16 U/L (ref 14–54)
AST: 20 U/L (ref 15–41)
Albumin: 3.7 g/dL (ref 3.5–5.0)
Alkaline Phosphatase: 67 U/L (ref 38–126)
Anion gap: 6 (ref 5–15)
BUN: 11 mg/dL (ref 6–20)
CO2: 26 mmol/L (ref 22–32)
CREATININE: 0.45 mg/dL (ref 0.44–1.00)
Calcium: 8.7 mg/dL — ABNORMAL LOW (ref 8.9–10.3)
Chloride: 104 mmol/L (ref 101–111)
GFR calc Af Amer: >60 mL/min (ref >60)
GFR calc non Af Amer: >60 mL/min (ref >60)
Glucose, Bld: 90 mg/dL (ref 70–99)
Potassium: 3.3 mmol/L — ABNORMAL LOW (ref 3.5–5.1)
SODIUM: 136 mmol/L (ref 135–145)
TOTAL PROTEIN: 7.7 g/dL (ref 6.5–8.1)
Total Bilirubin: 0.5 mg/dL (ref 0.3–1.2)

## 2015-04-14 LAB — CBC
HCT: 38.1 % (ref 36.0–46.0)
HEMOGLOBIN: 11.7 g/dL — AB (ref 12.0–15.0)
MCH: 27 pg (ref 26.0–34.0)
MCHC: 30.7 g/dL (ref 30.0–36.0)
MCV: 87.8 fL (ref 78.0–100.0)
Platelets: 320 10*3/uL (ref 150–400)
RBC: 4.34 MIL/uL (ref 3.87–5.11)
RDW: 13.4 % (ref 11.5–15.5)
WBC: 7.6 10*3/uL (ref 4.0–10.5)

## 2015-04-14 LAB — D-DIMER, QUANTITATIVE: D-Dimer, Quant: <0.27 ug/mL-FEU (ref 0.00–0.48)

## 2015-04-14 MED ORDER — IBUPROFEN 600 MG PO TABS: 600.0000 mg | ORAL_TABLET | Freq: Three times a day (TID) | ORAL | Status: DC | PRN

## 2015-04-14 MED ORDER — KETOROLAC TROMETHAMINE 30 MG/ML IJ SOLN
30.0000 mg | Freq: Once | INTRAMUSCULAR | Status: AC
  Administered 2015-04-14: 30 mg via INTRAVENOUS
  Filled 2015-04-14: qty 1

## 2015-04-14 MED ORDER — OMEPRAZOLE 20 MG PO CPDR: 20.0000 mg | DELAYED_RELEASE_CAPSULE | Freq: Every day | ORAL | Status: DC

## 2015-04-14 NOTE — ED Provider Notes (Addendum)
CSN: [id number]     Arrival date & time 04/14/15  4 History   First MD Initiated Contact with Patient 04/14/15 1746     Chief Complaint  Patient presents with  . Chest Pain  . Shortness of Breath     HPI Patient presents emergency department complaining of chest pain that began earlier this afternoon while driving her car.  She feels as though this is right sided chest discomfort.  Is worse with palpation of her right sternum.  No injury or trauma.  No fevers or chills.  No productive cough.  No history DVT or pulmonary embolism.  Patient states she's had this is an intermittent issue over the past several months.  She spoke with her primary care physician about it.  No clear answer has been found.  Earlier in the day she was in her normal state health.  Family reports that she seems slightly short of breath at this time.  Patient denies nausea and vomiting.  No diarrhea.  No other complaints.   Past Medical History  Diagnosis Date  . Migraine     with some aura  . Hyperlipidemia   . Dysmenorrhea   . Essential hypertension 12/27/2014   History reviewed. No pertinent past surgical history. Family History  Problem Relation Age of Onset  . Hypertension Mother   . Diabetes Mother   . Stroke Mother     36s SD  2010   History  Substance Use Topics  . Smoking status: Never Smoker   . Smokeless tobacco: Not on file  . Alcohol Use: No   OB History    Gravida Para Term Preterm AB TAB SAB Ectopic Multiple Living   4 4        4      Review of Systems  All other systems reviewed and are negative.     Allergies  Review of patient's allergies indicates no known allergies.  Home Medications   Prior to Admission medications   Medication Sig Start Date End Date Taking? Authorizing Provider  hydrochlorothiazide (HYDRODIURIL) 25 MG tablet Take 0.5 tablets (12.5 mg total) by mouth daily. 02/20/15  Yes Burnis Medin, MD  ibuprofen (ADVIL,MOTRIN) 600 MG tablet Take 1 tablet (600 mg  total) by mouth every 8 (eight) hours as needed. 04/14/15   Jola Schmidt, MD  omeprazole (PRILOSEC) 20 MG capsule Take 1 capsule (20 mg total) by mouth daily. 04/14/15   Jola Schmidt, MD   BP 164/87 mmHg  Pulse 114  Temp(Src) 98.2 F (36.8 C) (Oral)  Resp 18  SpO2 100%  LMP 03/24/2015 Physical Exam  Constitutional: She is oriented to person, place, and time. She appears well-developed and well-nourished. No distress.  HENT:  Head: Normocephalic and atraumatic.  Eyes: EOM are normal.  Neck: Normal range of motion.  Cardiovascular: Normal rate, regular rhythm and normal heart sounds.   Pulmonary/Chest: Effort normal and breath sounds normal.  Abdominal: Soft. She exhibits no distension. There is no tenderness.  Musculoskeletal: Normal range of motion.  Neurological: She is alert and oriented to person, place, and time.  Skin: Skin is warm and dry.  Psychiatric: She has a normal mood and affect. Judgment normal.  Nursing note and vitals reviewed.   ED Course  Procedures (including critical care time) Labs Review Labs Reviewed  CBC - Abnormal; Notable for the following:    Hemoglobin 11.7 (*)    All other components within normal limits  COMPREHENSIVE METABOLIC PANEL - Abnormal; Notable for the following:  Potassium 3.3 (*)    Calcium 8.7 (*)    All other components within normal limits  D-DIMER, QUANTITATIVE  I-STAT TROPOININ, ED  I-STAT TROPOININ, ED    Imaging Review No results found.  EKG Interpretation Date/Time:  Monday Apr 14 2015 17:59:55 EDT Ventricular Rate:  109 PR Interval:  160 QRS Duration: 71 QT Interval:  326 QTC Calculation: 439 R Axis:   34 Text Interpretation:  Sinus tachycardia Borderline T abnormalities,  diffuse leads  No significant change    MDM   Final diagnoses:  Chest pain, unspecified chest pain type    Patient is overall well-appearing.  Low suspicion for ACS.  D-dimer normal.  Her pain really seems reproducible in the right side  of her chest.  This could be musculoskeletal versus GERD.  I'll place the patient on Prilosec.  Discharge home in good condition.    Jola Schmidt, MD 04/17/15 [place], MD 04/17/15 [phone]

## 2015-04-14 NOTE — ED Notes (Signed)
MD at bedside. 

## 2015-04-14 NOTE — ED Notes (Signed)
Bed: WA06 Expected date:  Expected time:  Means of arrival:  Comments: Dr. Jonelle Sidle

## 2015-04-14 NOTE — Discharge Instructions (Signed)

## 2015-04-14 NOTE — ED Notes (Signed)
Pt  C/o chest pain starting earlier this afternoon while driving. Pt unable to describe the chest pain but sts that "it feels like something is dropping in chest."  Pt sts she feels like her heart is racing. Pt originally denied SOB, but when family arrived they stated pt was having SOB. After family mentioned it, pt agreed. Pt denies N/V, dizziness, lightheadedness. Pt sts it hurts to press on her chest. Pt also sts the pain radiates into her back. A&Ox4 and ambulatory.

## 2015-05-09 ENCOUNTER — Encounter: Payer: Self-pay | Admitting: Family Medicine

## 2015-05-09 ENCOUNTER — Other Ambulatory Visit (INDEPENDENT_AMBULATORY_CARE_PROVIDER_SITE_OTHER): Payer: 59

## 2015-05-09 ENCOUNTER — Ambulatory Visit (INDEPENDENT_AMBULATORY_CARE_PROVIDER_SITE_OTHER): Payer: 59 | Admitting: Family Medicine

## 2015-05-09 VITALS — BP 128/82 | HR 95 | Ht 62.5 in | Wt 202.0 lb

## 2015-05-09 DIAGNOSIS — M25562 Pain in left knee: Secondary | ICD-10-CM

## 2015-05-09 DIAGNOSIS — M659 Synovitis and tenosynovitis, unspecified: Secondary | ICD-10-CM

## 2015-05-09 NOTE — Patient Instructions (Addendum)
Good to see you In 6 hours ice the knee Continue to stay active and do the exercises Call me on Tuesday and if not better I will order an MRI Otherwise see me in 2 weeks to make sure you are better and we may want to get some labs.

## 2015-05-09 NOTE — Progress Notes (Signed)
[name] [address], [place] [address] Phone: [phone] Subjective:    I'm seeing this patient by the request  of:  Lottie Dawson, MD   CC: Left knee pain follow-up  [id number] Teresa Hickman is a 44 y.o. female coming in with complaint of left knee pain. Patient was seen previously and was diagnosed with patellofemoral pain syndrome. Patient was given an injection 10 weeks ago. Patient also had X was at that time that showed patient does have moderate medial compartment arthritis. Patient was to do home exercises, topical anti-inflammatory, and icing protocol. Patient states she is doing very well up to this week. Patient states starting this week she started having severe pain that seemed to be giving her difficulty with extending the knee all the way and flexing. Denies any significant instability. Patient states though that it feels like it is somewhat swollen. Patient has had to stop her activity secondary to the pain. Patient is even walking differently she states secondary to the pain. Patient has tried ibuprofen with no significant improvement. Denies any fevers or chills or any abnormal weight loss. Past medical history, social, surgical and family history all reviewed in electronic medical record.   Review of Systems: No headache, visual changes, nausea, vomiting, diarrhea, constipation, dizziness, abdominal pain, skin rash, fevers, chills, night sweats, weight loss, swollen lymph nodes, body aches, joint swelling, muscle aches, chest pain, shortness of breath, mood changes.   Objective Blood pressure 128/82, pulse 95, height 5' 2.5" (1.588 m), weight 202 lb (91.627 kg), last menstrual period 03/24/2015, SpO2 99 %.  General: No apparent distress alert and oriented x3 mood and affect normal, dressed appropriately.  HEENT: Pupils equal, extraocular movements intact  Respiratory: Patient's speak in full sentences and does not appear  short of breath  Cardiovascular: No lower extremity edema, non tender, no erythema  Skin: Warm dry intact with no signs of infection or rash on extremities or on axial skeleton.  Abdomen: Soft nontender  Neuro: Cranial nerves II through XII are intact, neurovascularly intact in all extremities with 2+ DTRs and 2+ pulses.  Lymph: No lymphadenopathy of posterior or anterior cervical chain or axillae bilaterally.  Gait normal with good balance and coordination.  MSK:  Non tender with full range of motion and good stability and symmetric strength and tone of shoulders, elbows, wrist, hip, and ankles bilaterally.  Knee: Left Normal to inspection with no erythema or effusion or obvious bony abnormalities. Mild lateral tenderness with lateral pain over the superior lateral aspect of the patella. ROM full in flexion and extension and lower leg rotation. Ligaments with solid consistent endpoints including ACL, PCL, LCL, MCL. Negative Mcmurray's, Apley's, and Thessalonian tests. Non painful patellar compression. Patellar glide with moderate crepitus Patellar and quadriceps tendons unremarkable. Hamstring and quadriceps strength is normal.   MSK US performed of: Left knee This study was ordered, performed, and interpreted by Charlann Boxer D.O.  Knee: All structures visualized. Anteromedial, anterolateral, posteromedial, and posterolateral menisci unremarkable without tearing, fraying, effusion, or displacement. Moderate narrowing of the medial and lateral joint lines patient does have an effusion with what appears to be a very large synovitis. Patellar Tendon unremarkable on long and transverse views without effusion. No abnormality of prepatellar bursa. LCL and MCL unremarkable on long and transverse views. No abnormality of origin of medial or lateral head of the gastrocnemius.  IMPRESSION:  Synovitis with effusion    Procedure: Real-time Ultrasound Guided Injection of left knee  Device: GE  Logiq E  Ultrasound guided injection is preferred based studies that show increased duration, increased effect, greater accuracy, decreased procedural pain, increased response rate, and decreased cost with ultrasound guided versus blind injection.  Verbal informed consent obtained.  Time-out conducted.  Noted no overlying erythema, induration, or other signs of local infection.  Skin prepped in a sterile fashion.  Local anesthesia: Topical Ethyl chloride.  With sterile technique and under real time ultrasound guidance: With a 22-gauge 2 inch needle patient was injected with 4 cc of 0.5% Marcaine and attempted aspiration with 1 mL then injected 1 cc of Kenalog 40 mg/dL. This was from a superior lateral approach.  Completed without difficulty  Pain immediately resolved suggesting accurate placement of the medication.  Advised to call if fevers/chills, erythema, induration, drainage, or persistent bleeding.  Images permanently stored and available for review in the ultrasound unit.  Impression: Technically successful ultrasound guided injection.    Impression and Recommendations:     This case required medical decision making of moderate complexity.

## 2015-05-09 NOTE — Assessment & Plan Note (Signed)
She was given an injection today. Patient did not have any aspiration secondary to the amount of the synovitis. We discussed icing regimen, home exercises and compression. Patient will be calling in the next 96 hours and if any worsening symptoms we may need to consider advanced imaging. Patient otherwise will come back in 2 weeks. At that time I would like to rule out other differentials including autoimmune disease and gout with lab draws. Patient's underlying osteoarthritic changes could also be contributing and will advance imaging may be necessary.  Spent  25 minutes with patient face-to-face and had greater than 50% of counseling including as described above in assessment and plan.

## 2015-05-09 NOTE — Progress Notes (Signed)
Pre visit review using our clinic review tool, if applicable. No additional management support is needed unless otherwise documented below in the visit note. 

## 2015-05-13 ENCOUNTER — Other Ambulatory Visit (INDEPENDENT_AMBULATORY_CARE_PROVIDER_SITE_OTHER): Payer: 59

## 2015-05-13 DIAGNOSIS — I10 Essential (primary) hypertension: Secondary | ICD-10-CM

## 2015-05-13 LAB — BASIC METABOLIC PANEL
BUN: 10 mg/dL (ref 6–23)
CHLORIDE: 100 meq/L (ref 96–112)
CO2: 30 mEq/L (ref 19–32)
Calcium: 9.4 mg/dL (ref 8.4–10.5)
Creatinine, Ser: 0.57 mg/dL (ref 0.40–1.20)
GFR: 147.3 mL/min (ref >60.00)
Glucose, Bld: 68 mg/dL — ABNORMAL LOW (ref 70–99)
Potassium: 3.6 mEq/L (ref 3.5–5.1)
SODIUM: 136 meq/L (ref 135–145)

## 2015-05-22 ENCOUNTER — Ambulatory Visit (INDEPENDENT_AMBULATORY_CARE_PROVIDER_SITE_OTHER): Payer: 59 | Admitting: Internal Medicine

## 2015-05-22 ENCOUNTER — Encounter: Payer: Self-pay | Admitting: Internal Medicine

## 2015-05-22 VITALS — BP 128/80 | Temp 98.1°F | Ht 62.5 in | Wt 201.0 lb

## 2015-05-22 DIAGNOSIS — I10 Essential (primary) hypertension: Secondary | ICD-10-CM | POA: Diagnosis not present

## 2015-05-22 MED ORDER — HYDROCHLOROTHIAZIDE 25 MG PO TABS: 12.5000 mg | ORAL_TABLET | Freq: Every day | ORAL | Status: DC

## 2015-05-22 NOTE — Patient Instructions (Addendum)
   Blood pressure reading is elevated  today  In the office but  readings at home are in the 120  To 130  range which is at goal.  This week take 2 blood pressure readings a day for 7 days   if  130 and below  Range,  stay on the half of the HCTZ 12.5 mg  once a day.  If the top number is above  130  Most days  then increase to  a whole pill of the HCTZ 25 mg.   If at goal  Plan cpx and labs in 3- 4  Months or as needed

## 2015-05-22 NOTE — Progress Notes (Signed)
Pre visit review using our clinic review tool, if applicable. No additional management support is needed unless otherwise documented below in the visit note.  Chief Complaint  Patient presents with  . Follow-up    HPI: Teresa Hickman 44 y.o. comes inf today for fu of ht medication and rx   Had ed visit for   For  CP  In MAY  Neg d dimer  enzymes felt to be low risk acs.e pain gone  Has also seen dr Tamala Julian for her knee pain   With effusion   ED dx cwp vs gerd   Better .  Says her BP  readings are good  Readings  4 days ago and checked readings 3-4 x   Per week and 120 / 130 range  128 recently ( also ok in ed) ROS: See pertinent positives and negatives per HPI.  Past Medical History  Diagnosis Date  . Migraine     with some aura  . Hyperlipidemia   . Dysmenorrhea   . Essential hypertension 12/27/2014    Family History  Problem Relation Age of Onset  . Hypertension Mother   . Diabetes Mother   . Stroke Mother     70s SD  2010    History   Social History  . Marital Status: Married    Spouse Name: N/A  . Number of Children: N/A  . Years of Education: N/A   Social History Main Topics  . Smoking status: Never Smoker   . Smokeless tobacco: Not on file  . Alcohol Use: No  . Drug Use: No  . Sexual Activity: No   Other Topics Concern  . None   Social History Narrative   From [place]   Medical Family   HH of 6    No pets   G4 p4     Outpatient Prescriptions Prior to Visit  Medication Sig Dispense Refill  . ibuprofen (ADVIL,MOTRIN) 600 MG tablet Take 1 tablet (600 mg total) by mouth every 8 (eight) hours as needed. 15 tablet 0  . hydrochlorothiazide (HYDRODIURIL) 25 MG tablet Take 0.5 tablets (12.5 mg total) by mouth daily. 30 tablet 3  . omeprazole (PRILOSEC) 20 MG capsule Take 1 capsule (20 mg total) by mouth daily. (Patient not taking: Reported on 05/22/2015) 30 capsule 0   No facility-administered medications prior to visit.     EXAM:  BP 128/80 mmHg   Temp(Src) 98.1 F (36.7 C) (Oral)  Ht 5' 2.5" (1.588 m)  Wt 201 lb (91.173 kg)  BMI 36.15 kg/m2  Body mass index is 36.15 kg/(m^2).  GENERAL: vitals reviewed and listed above, alert, oriented, appears well hydrated and in no acute distress HEENT: atraumatic, conjunctiva  clear, no obvious abnormalities on inspection of external nose and ears   cv hr r no g or m noted sitting   Repeated right sitting   PSYCH: pleasant and cooperative, no obvious depression or anxiety BP Readings from Last 3 Encounters:  05/22/15 128/80  05/09/15 128/82  04/14/15 127/56   Lab Results  Component Value Date   WBC 7.6 04/14/2015   HGB 11.7* 04/14/2015   HCT 38.1 04/14/2015   PLT 320 04/14/2015   GLUCOSE 68* 05/13/2015   CHOL 246* 11/18/2014   TRIG 69.0 11/18/2014   HDL 50.70 11/18/2014   LDLDIRECT 176.4 10/09/2008   LDLCALC 182* 11/18/2014   ALT 16 04/14/2015   AST 20 04/14/2015   NA 136 05/13/2015   K 3.6 05/13/2015   CL 100  05/13/2015   CREATININE 0.57 05/13/2015   BUN 10 05/13/2015   CO2 30 05/13/2015   TSH 1.64 11/18/2014     ASSESSMENT AND PLAN:  Discussed the following assessment and plan:  Essential hypertension - seems to be tolerating and report control at home inc  to 25 per day as per instruction as needed Had low bp on chlorthalidone so go slow with med adjsutments  -Patient advised to return or notify health care team  if symptoms worsen ,persist or new concerns arise.  Patient Instructions   Blood pressure reading is elevated  today  In the office but  readings at home are in the 120  To 130  range which is at goal.  This week take 2 blood pressure readings a day for 7 days   if  130 and below  Range,  stay on the half of the HCTZ 12.5 mg  once a day.  If the top number is above  130  Most days  then increase to  a whole pill of the HCTZ 25 mg.   If at goal  Plan cpx and labs in 3- 4  Months or as needed      Standley Brooking. Sanaiya Welliver M.D.

## 2015-05-29 ENCOUNTER — Encounter: Payer: Self-pay | Admitting: Family Medicine

## 2015-05-29 ENCOUNTER — Ambulatory Visit (INDEPENDENT_AMBULATORY_CARE_PROVIDER_SITE_OTHER): Payer: 59 | Admitting: Family Medicine

## 2015-05-29 VITALS — BP 126/84 | HR 80 | Ht 62.5 in | Wt 199.0 lb

## 2015-05-29 DIAGNOSIS — M129 Arthropathy, unspecified: Secondary | ICD-10-CM | POA: Diagnosis not present

## 2015-05-29 DIAGNOSIS — M25562 Pain in left knee: Secondary | ICD-10-CM | POA: Insufficient documentation

## 2015-05-29 DIAGNOSIS — IMO0002 Reserved for concepts with insufficient information to code with codable children: Secondary | ICD-10-CM

## 2015-05-29 NOTE — Patient Instructions (Signed)
Good to see you Ice after activity OK to do any activity you want If swelling happens again come back and see me can repeat injection every 3 months if needed.

## 2015-05-29 NOTE — Progress Notes (Signed)
  [name] [address] [place], [place] [address] Phone: [phone] Subjective:    I'm seeing this patient by the request  of:  Lottie Dawson, MD   CC: Left knee pain follow-up  AXK:[id number] VEDIKA Teresa Hickman is a 44 y.o. female coming in with complaint of left knee pain. Patient was seen previously and was diagnosed with patellofemoral pain syndrome, moderate arthritis, as well as a synovitis. Patient was given an injection and since then has been doing conservative therapy with home exercises, icing protocol, as well as topical anti-inflammatory. Patient states that she is approximately 85% better. Denies any instability. States that she still has discomfort in standing for a long amount of time. Notices certain shoes can cause more discomfort as well. Patient states that she can do daily activities and is sleeping comfortably.  Review of Systems: No headache, visual changes, nausea, vomiting, diarrhea, constipation, dizziness, abdominal pain, skin rash, fevers, chills, night sweats, weight loss, swollen lymph nodes, body aches, joint swelling, muscle aches, chest pain, shortness of breath, mood changes.   Objective Blood pressure 126/84, pulse 80, height 5' 2.5" (1.588 m), weight 199 lb (90.266 kg), SpO2 99 %.  General: No apparent distress alert and oriented x3 mood and affect normal, dressed appropriately.  HEENT: Pupils equal, extraocular movements intact  Respiratory: Patient's speak in full sentences and does not appear short of breath  Cardiovascular: No lower extremity edema, non tender, no erythema  Skin: Warm dry intact with no signs of infection or rash on extremities or on axial skeleton.  Abdomen: Soft nontender  Neuro: Cranial nerves II through XII are intact, neurovascularly intact in all extremities with 2+ DTRs and 2+ pulses.  Lymph: No lymphadenopathy of posterior or anterior cervical chain or axillae bilaterally.  Gait normal with  good balance and coordination.  MSK:  Non tender with full range of motion and good stability and symmetric strength and tone of shoulders, elbows, wrist, hip, and ankles bilaterally.  Knee: Left Normal to inspection with no erythema or effusion or obvious bony abnormalities. Nontender on exam today ROM full in flexion and extension and lower leg rotation. Ligaments with solid consistent endpoints including ACL, PCL, LCL, MCL. Negative Mcmurray's, Apley's, and Thessalonian tests. Non painful patellar compression. Patellar glide with moderate crepitus Patellar and quadriceps tendons unremarkable. Hamstring and quadriceps strength is normal.  Significant improvement from previous exam Contralateral knee unremarkable     Impression and Recommendations:     This case required medical decision making of moderate complexity.

## 2015-05-29 NOTE — Assessment & Plan Note (Signed)
Patient does have moderate arthritis of the knee that is likely contributing to some of the underlying problem. We discussed with patient is any other exacerbation occurs patient should come back immediately. In the icing and patient was strengthening exercises. We discussed what activities to potentially avoid. I do not think the patient needs any stability such as a brace at this time. Patient has any worsening symptoms we will consider this. We may need to consider further workup if patient continues to have repetitive synovitis. At this point I do not think it is necessary. Patient will follow-up with me again if any worsening symptoms occur.

## 2015-05-29 NOTE — Progress Notes (Signed)
Pre visit review using our clinic review tool, if applicable. No additional management support is needed unless otherwise documented below in the visit note. 

## 2015-09-03 ENCOUNTER — Other Ambulatory Visit (INDEPENDENT_AMBULATORY_CARE_PROVIDER_SITE_OTHER): Payer: 59

## 2015-09-03 DIAGNOSIS — Z0000 Encounter for general adult medical examination without abnormal findings: Secondary | ICD-10-CM

## 2015-09-03 LAB — CBC WITH DIFFERENTIAL/PLATELET
BASOS PCT: 1 % (ref 0.0–3.0)
Basophils Absolute: 0.1 10*3/uL (ref 0.0–0.1)
EOS ABS: 0.1 10*3/uL (ref 0.0–0.7)
Eosinophils Relative: 0.8 % (ref 0.0–5.0)
HEMATOCRIT: 39.1 % (ref 36.0–46.0)
Hemoglobin: 12.4 g/dL (ref 12.0–15.0)
LYMPHS PCT: 37 % (ref 12.0–46.0)
Lymphs Abs: 2.6 10*3/uL (ref 0.7–4.0)
MCHC: 31.8 g/dL (ref 30.0–36.0)
MCV: 86.7 fl (ref 78.0–100.0)
Monocytes Absolute: 0.6 10*3/uL (ref 0.1–1.0)
Monocytes Relative: 7.8 % (ref 3.0–12.0)
NEUTROS PCT: 53.4 % (ref 43.0–77.0)
Neutro Abs: 3.8 10*3/uL (ref 1.4–7.7)
Platelets: 330 10*3/uL (ref 150.0–400.0)
RBC: 4.51 Mil/uL (ref 3.87–5.11)
RDW: 14.6 % (ref 11.5–15.5)
WBC: 7.1 10*3/uL (ref 4.0–10.5)

## 2015-09-03 LAB — HEPATIC FUNCTION PANEL
ALBUMIN: 3.6 g/dL (ref 3.5–5.2)
ALT: 13 U/L (ref 0–35)
AST: 12 U/L (ref 0–37)
Alkaline Phosphatase: 66 U/L (ref 39–117)
Bilirubin, Direct: 0.1 mg/dL (ref 0.0–0.3)
TOTAL PROTEIN: 7.2 g/dL (ref 6.0–8.3)
Total Bilirubin: 0.4 mg/dL (ref 0.2–1.2)

## 2015-09-03 LAB — BASIC METABOLIC PANEL
BUN: 11 mg/dL (ref 6–23)
CHLORIDE: 104 meq/L (ref 96–112)
CO2: 32 meq/L (ref 19–32)
Calcium: 8.8 mg/dL (ref 8.4–10.5)
Creatinine, Ser: 0.52 mg/dL (ref 0.40–1.20)
GFR: 163.54 mL/min (ref >60.00)
Glucose, Bld: 76 mg/dL (ref 70–99)
Potassium: 4.1 mEq/L (ref 3.5–5.1)
Sodium: 140 mEq/L (ref 135–145)

## 2015-09-03 LAB — LIPID PANEL
CHOLESTEROL: 266 mg/dL — AB (ref 0–200)
HDL: 52.8 mg/dL (ref >39.00)
LDL Cholesterol: 196 mg/dL — ABNORMAL HIGH (ref 0–99)
NonHDL: 212.99
TRIGLYCERIDES: 84 mg/dL (ref 0.0–149.0)
Total CHOL/HDL Ratio: 5
VLDL: 16.8 mg/dL (ref 0.0–40.0)

## 2015-09-03 LAB — TSH: TSH: 2.31 u[IU]/mL (ref 0.35–4.50)

## 2015-09-10 ENCOUNTER — Ambulatory Visit (INDEPENDENT_AMBULATORY_CARE_PROVIDER_SITE_OTHER): Payer: 59 | Admitting: Internal Medicine

## 2015-09-10 ENCOUNTER — Encounter: Payer: Self-pay | Admitting: Internal Medicine

## 2015-09-10 VITALS — BP 140/92 | HR 108 | Wt 200.0 lb

## 2015-09-10 DIAGNOSIS — E785 Hyperlipidemia, unspecified: Secondary | ICD-10-CM | POA: Diagnosis not present

## 2015-09-10 DIAGNOSIS — Z8249 Family history of ischemic heart disease and other diseases of the circulatory system: Secondary | ICD-10-CM | POA: Diagnosis not present

## 2015-09-10 DIAGNOSIS — Z0000 Encounter for general adult medical examination without abnormal findings: Secondary | ICD-10-CM

## 2015-09-10 DIAGNOSIS — Z823 Family history of stroke: Secondary | ICD-10-CM

## 2015-09-10 DIAGNOSIS — I10 Essential (primary) hypertension: Secondary | ICD-10-CM | POA: Diagnosis not present

## 2015-09-10 NOTE — Patient Instructions (Addendum)
Your bp is up in office     Continue to monitor  To ensure   Below 140/90 let us know if you want [place] to send in  hctz 12.5 mg per day.   Your cholesterol is high range  . considieration of medication if remains that way .   Prefer you implement life style diet changes acitivy to control this problem .  Will be contacted about nutrition referral   Mediterranean diet is the healthest at this time.  Plan recheck lipids and bmp in about 6 months  And ov.    Health Maintenance Adopting a healthy lifestyle and getting preventive care can go a long way to promote health and wellness. Talk with your health care provider about what schedule of regular examinations is right for you. This is a good chance for you to check in with your provider about disease prevention and staying healthy. In between checkups, there are plenty of things you can do on your own. Experts have done a lot of research about which lifestyle changes and preventive measures are most likely to keep you healthy. Ask your health care provider for more information. WEIGHT AND DIET  Eat a healthy diet  Be sure to include plenty of vegetables, fruits, low-fat dairy products, and lean protein.  Do not eat a lot of foods high in solid fats, added sugars, or salt.  Get regular exercise. This is one of the most important things you can do for your health.  Most adults should exercise for at least 150 minutes each week. The exercise should increase your heart rate and make you sweat (moderate-intensity exercise).  Most adults should also do strengthening exercises at least twice a week. This is in addition to the moderate-intensity exercise.  Maintain a healthy weight  Body mass index (BMI) is a measurement that can be used to identify possible weight problems. It estimates body fat based on height and weight. Your health care provider can help determine your BMI and help you achieve or maintain a healthy weight.  For females  102 years of age and older:   A BMI below 18.5 is considered underweight.  A BMI of 18.5 to 24.9 is normal.  A BMI of 25 to 29.9 is considered overweight.  A BMI of 30 and above is considered obese.  Watch levels of cholesterol and blood lipids  You should start having your blood tested for lipids and cholesterol at 45 years of age, then have this test every 5 years.  You may need to have your cholesterol levels checked more often if:  Your lipid or cholesterol levels are high.  You are older than 45 years of age.  You are at high risk for heart disease.  CANCER SCREENING   Lung Cancer  Lung cancer screening is recommended for adults 28-84 years old who are at high risk for lung cancer because of a history of smoking.  A yearly low-dose CT scan of the lungs is recommended for people who:  Currently smoke.  Have quit within the past 15 years.  Have at least a 30-pack-year history of smoking. A pack year is smoking an average of one pack of cigarettes a day for 1 year.  Yearly screening should continue until it has been 15 years since you quit.  Yearly screening should stop if you develop a health problem that would prevent you from having lung cancer treatment.  Breast Cancer  Practice breast self-awareness. This means understanding how your  breasts normally appear and feel.  It also means doing regular breast self-exams. Let your health care provider know about any changes, no matter how small.  If you are in your 20s or 30s, you should have a clinical breast exam (CBE) by a health care provider every 1-3 years as part of a regular health exam.  If you are 93 or older, have a CBE every year. Also consider having a breast X-ray (mammogram) every year.  If you have a family history of breast cancer, talk to your health care provider about genetic screening.  If you are at high risk for breast cancer, talk to your health care provider about having an MRI and a  mammogram every year.  Breast cancer gene (BRCA) assessment is recommended for women who have family members with BRCA-related cancers. BRCA-related cancers include:  Breast.  Ovarian.  Tubal.  Peritoneal cancers.  Results of the assessment will determine the need for genetic counseling and BRCA1 and BRCA2 testing. Cervical Cancer Routine pelvic examinations to screen for cervical cancer are no longer recommended for nonpregnant women who are considered low risk for cancer of the pelvic organs (ovaries, uterus, and vagina) and who do not have symptoms. A pelvic examination may be necessary if you have symptoms including those associated with pelvic infections. Ask your health care provider if a screening pelvic exam is right for you.   The Pap test is the screening test for cervical cancer for women who are considered at risk.  If you had a hysterectomy for a problem that was not cancer or a condition that could lead to cancer, then you no longer need Pap tests.  If you are older than 65 years, and you have had normal Pap tests for the past 10 years, you no longer need to have Pap tests.  If you have had past treatment for cervical cancer or a condition that could lead to cancer, you need Pap tests and screening for cancer for at least 20 years after your treatment.  If you no longer get a Pap test, assess your risk factors if they change (such as having a new sexual partner). This can affect whether you should start being screened again.  Some women have medical problems that increase their chance of getting cervical cancer. If this is the case for you, your health care provider may recommend more frequent screening and Pap tests.  The human papillomavirus (HPV) test is another test that may be used for cervical cancer screening. The HPV test looks for the virus that can cause cell changes in the cervix. The cells collected during the Pap test can be tested for HPV.  The HPV test can  be used to screen women 40 years of age and older. Getting tested for HPV can extend the interval between normal Pap tests from three to five years.  An HPV test also should be used to screen women of any age who have unclear Pap test results.  After 45 years of age, women should have HPV testing as often as Pap tests.  Colorectal Cancer  This type of cancer can be detected and often prevented.  Routine colorectal cancer screening usually begins at 45 years of age and continues through 45 years of age.  Your health care provider may recommend screening at an earlier age if you have risk factors for colon cancer.  Your health care provider may also recommend using home test kits to check for hidden blood in the stool.  A small camera at the end of a tube can be used to examine your colon directly (sigmoidoscopy or colonoscopy). This is done to check for the earliest forms of colorectal cancer.  Routine screening usually begins at age 5.  Direct examination of the colon should be repeated every 5-10 years through 45 years of age. However, you may need to be screened more often if early forms of precancerous polyps or small growths are found. Skin Cancer  Check your skin from head to toe regularly.  Tell your health care provider about any new moles or changes in moles, especially if there is a change in a mole's shape or color.  Also tell your health care provider if you have a mole that is larger than the size of a pencil eraser.  Always use sunscreen. Apply sunscreen liberally and repeatedly throughout the day.  Protect yourself by wearing long sleeves, pants, a wide-brimmed hat, and sunglasses whenever you are outside. HEART DISEASE, DIABETES, AND HIGH BLOOD PRESSURE   Have your blood pressure checked at least every 1-2 years. High blood pressure causes heart disease and increases the risk of stroke.  If you are between 71 years and 95 years old, ask your health care provider if  you should take aspirin to prevent strokes.  Have regular diabetes screenings. This involves taking a blood sample to check your fasting blood sugar level.  If you are at a normal weight and have a low risk for diabetes, have this test once every three years after 45 years of age.  If you are overweight and have a high risk for diabetes, consider being tested at a younger age or more often. PREVENTING INFECTION  Hepatitis B  If you have a higher risk for hepatitis B, you should be screened for this virus. You are considered at high risk for hepatitis B if:  You were born in a country where hepatitis B is common. Ask your health care provider which countries are considered high risk.  Your parents were born in a high-risk country, and you have not been immunized against hepatitis B (hepatitis B vaccine).  You have HIV or AIDS.  You use needles to inject street drugs.  You live with someone who has hepatitis B.  You have had sex with someone who has hepatitis B.  You get hemodialysis treatment.  You take certain medicines for conditions, including cancer, organ transplantation, and autoimmune conditions. Hepatitis C  Blood testing is recommended for:  Everyone born from [birth year] through 1966.  Anyone with known risk factors for hepatitis C. Sexually transmitted infections (STIs)  You should be screened for sexually transmitted infections (STIs) including gonorrhea and chlamydia if:  You are sexually active and are younger than 45 years of age.  You are older than 45 years of age and your health care provider tells you that you are at risk for this type of infection.  Your sexual activity has changed since you were last screened and you are at an increased risk for chlamydia or gonorrhea. Ask your health care provider if you are at risk.  If you do not have HIV, but are at risk, it may be recommended that you take a prescription medicine daily to prevent HIV infection. This is  called pre-exposure prophylaxis (PrEP). You are considered at risk if:  You are sexually active and do not regularly use condoms or know the HIV status of your partner(s).  You take drugs by injection.  You are sexually active  with a partner who has HIV. Talk with your health care provider about whether you are at high risk of being infected with HIV. If you choose to begin PrEP, you should first be tested for HIV. You should then be tested every 3 months for as long as you are taking PrEP.  PREGNANCY   If you are premenopausal and you may become pregnant, ask your health care provider about preconception counseling.  If you may become pregnant, take 400 to 800 micrograms (mcg) of folic acid every day.  If you want to prevent pregnancy, talk to your health care provider about birth control (contraception). OSTEOPOROSIS AND MENOPAUSE   Osteoporosis is a disease in which the bones lose minerals and strength with aging. This can result in serious bone fractures. Your risk for osteoporosis can be identified using a bone density scan.  If you are 28 years of age or older, or if you are at risk for osteoporosis and fractures, ask your health care provider if you should be screened.  Ask your health care provider whether you should take a calcium or vitamin D supplement to lower your risk for osteoporosis.  Menopause may have certain physical symptoms and risks.  Hormone replacement therapy may reduce some of these symptoms and risks. Talk to your health care provider about whether hormone replacement therapy is right for you.  HOME CARE INSTRUCTIONS   Schedule regular health, dental, and eye exams.  Stay current with your immunizations.   Do not use any tobacco products including cigarettes, chewing tobacco, or electronic cigarettes.  If you are pregnant, do not drink alcohol.  If you are breastfeeding, limit how much and how often you drink alcohol.  Limit alcohol intake to no more  than 1 drink per day for nonpregnant women. One drink equals 12 ounces of beer, 5 ounces of wine, or 1 ounces of hard liquor.  Do not use street drugs.  Do not share needles.  Ask your health care provider for help if you need support or information about quitting drugs.  Tell your health care provider if you often feel depressed.  Tell your health care provider if you have ever been abused or do not feel safe at home. Document Released: 06/14/2011 Document Revised: 04/15/2014 Document Reviewed: 10/31/2013 [facility] Patient Information 2015 [place], [place]. This information is not intended to replace advice given to you by your health care provider. Make sure you discuss any questions you have with your health care provider.  Stroke Prevention Some medical conditions and behaviors are associated with an increased chance of having a stroke. You may prevent a stroke by making healthy choices and managing medical conditions. HOW CAN I REDUCE MY RISK OF HAVING A STROKE?   Stay physically active. Get at least 30 minutes of activity on most or all days.  Do not smoke. It may also be helpful to avoid exposure to secondhand smoke.  Limit alcohol use. Moderate alcohol use is considered to be:  No more than 2 drinks per day for men.  No more than 1 drink per day for nonpregnant women.  Eat healthy foods. This involves:  Eating 5 or more servings of fruits and vegetables a day.  Making dietary changes that address high blood pressure (hypertension), high cholesterol, diabetes, or obesity.  Manage your cholesterol levels.  Making food choices that are high in fiber and low in saturated fat, trans fat, and cholesterol may control cholesterol levels.  Take any prescribed medicines to control cholesterol as  directed by your health care provider.  Manage your diabetes.  Controlling your carbohydrate and sugar intake is recommended to manage diabetes.  Take any prescribed medicines to  control diabetes as directed by your health care provider.  Control your hypertension.  Making food choices that are low in salt (sodium), saturated fat, trans fat, and cholesterol is recommended to manage hypertension.  Take any prescribed medicines to control hypertension as directed by your health care provider.  Maintain a healthy weight.  Reducing calorie intake and making food choices that are low in sodium, saturated fat, trans fat, and cholesterol are recommended to manage weight.  Stop drug abuse.  Avoid taking birth control pills.  Talk to your health care provider about the risks of taking birth control pills if you are over 39 years old, smoke, get migraines, or have ever had a blood clot.  Get evaluated for sleep disorders (sleep apnea).  Talk to your health care provider about getting a sleep evaluation if you snore a lot or have excessive sleepiness.  Take medicines only as directed by your health care provider.  For some people, aspirin or blood thinners (anticoagulants) are helpful in reducing the risk of forming abnormal blood clots that can lead to stroke. If you have the irregular heart rhythm of atrial fibrillation, you should be on a blood thinner unless there is a good reason you cannot take them.  Understand all your medicine instructions.  Make sure that other conditions (such as anemia or atherosclerosis) are addressed. SEEK IMMEDIATE MEDICAL CARE IF:   You have sudden weakness or numbness of the face, arm, or leg, especially on one side of the body.  Your face or eyelid droops to one side.  You have sudden confusion.  You have trouble speaking (aphasia) or understanding.  You have sudden trouble seeing in one or both eyes.  You have sudden trouble walking.  You have dizziness.  You have a loss of balance or coordination.  You have a sudden, severe headache with no known cause.  You have new chest pain or an irregular heartbeat. Any of these  symptoms may represent a serious problem that is an emergency. Do not wait to see if the symptoms will go away. Get medical help at once. Call your local emergency services (911 in [place] Do not drive yourself to the hospital. Document Released: 01/06/2005 Document Revised: 04/15/2014 Document Reviewed: 06/01/2013 [facility] Patient Information 2015 [place], [place]. This information is not intended to replace advice given to you by your health care provider. Make sure you discuss any questions you have with your health care provider.    Why follow it? Research shows. . Those who follow the Mediterranean diet have a reduced risk of heart disease  . The diet is associated with a reduced incidence of Parkinson's and Alzheimer's diseases . People following the diet may have longer life expectancies and lower rates of chronic diseases  . The Dietary Guidelines for Americans recommends the Mediterranean diet as an eating plan to promote health and prevent disease  What Is the Mediterranean Diet?  . Healthy eating plan based on typical foods and recipes of Mediterranean-style cooking . The diet is primarily a plant based diet; these foods should make up a majority of meals   Starches - Plant based foods should make up a majority of meals - They are an important sources of vitamins, minerals, energy, antioxidants, and fiber - Choose whole grains, foods high in fiber and minimally processed items  -  Typical grain sources include wheat, oats, barley, corn, brown rice, bulgar, farro, millet, polenta, couscous  - Various types of beans include chickpeas, lentils, fava beans, black beans, white beans   Fruits  Veggies - Large quantities of antioxidant rich fruits & veggies; 6 or more servings  - Vegetables can be eaten raw or lightly drizzled with oil and cooked  - Vegetables common to the traditional Mediterranean Diet include: artichokes, arugula, beets, broccoli, brussel sprouts, cabbage, carrots, celery,  collard greens, cucumbers, eggplant, kale, leeks, lemons, lettuce, mushrooms, okra, onions, peas, peppers, potatoes, pumpkin, radishes, rutabaga, shallots, spinach, sweet potatoes, turnips, zucchini - Fruits common to the Mediterranean Diet include: apples, apricots, avocados, cherries, clementines, dates, figs, grapefruits, grapes, melons, nectarines, oranges, peaches, pears, pomegranates, strawberries, tangerines  Fats - Replace butter and margarine with healthy oils, such as olive oil, canola oil, and tahini  - Limit nuts to no more than a handful a day  - Nuts include walnuts, almonds, pecans, pistachios, pine nuts  - Limit or avoid candied, honey roasted or heavily salted nuts - Olives are central to the [employer] - can be eaten whole or used in a variety of dishes   Meats Protein - Limiting red meat: no more than a few times a month - When eating red meat: choose lean cuts and keep the portion to the size of deck of cards - Eggs: approx. 0 to 4 times a week  - Fish and lean poultry: at least 2 a week  - Healthy protein sources include, chicken, [place], lean beef, lamb - Increase intake of seafood such as tuna, salmon, trout, mackerel, shrimp, scallops - Avoid or limit high fat processed meats such as sausage and bacon  Dairy - Include moderate amounts of low fat dairy products  - Focus on healthy dairy such as fat free yogurt, skim milk, low or reduced fat cheese - Limit dairy products higher in fat such as whole or 2% milk, cheese, ice cream  Alcohol - Moderate amounts of red wine is ok  - No more than 5 oz daily for women (all ages) and men older than age 20  - No more than 10 oz of wine daily for men younger than 42  Other - Limit sweets and other desserts  - Use herbs and spices instead of salt to flavor foods  - Herbs and spices common to the traditional Mediterranean Diet include: basil, bay leaves, chives, cloves, cumin, fennel, garlic, lavender, marjoram, mint, oregano,  parsley, pepper, rosemary, sage, savory, sumac, tarragon, thyme   It's not just a diet, it's a lifestyle:  . The Mediterranean diet includes lifestyle factors typical of those in the region  . Foods, drinks and meals are best eaten with others and savored . Daily physical activity is important for overall good health . This could be strenuous exercise like running and aerobics . This could also be more leisurely activities such as walking, housework, yard-work, or taking the stairs . Moderation is the key; a balanced and healthy diet accommodates most foods and drinks . Consider portion sizes and frequency of consumption of certain foods   Meal Ideas & Options:  . Breakfast:  o Whole wheat toast or whole wheat English muffins with peanut butter & hard boiled egg o Steel cut oats topped with apples & cinnamon and skim milk  o Fresh fruit: banana, strawberries, melon, berries, peaches  o Smoothies: strawberries, bananas, greek yogurt, peanut butter o Low fat greek yogurt with blueberries and granola  o Egg white omelet with spinach and mushrooms o Breakfast couscous: whole wheat couscous, apricots, skim milk, cranberries  . Sandwiches:  o Hummus and grilled vegetables (peppers, zucchini, squash) on whole wheat bread   o Grilled chicken on whole wheat pita with lettuce, tomatoes, cucumbers or tzatziki  o Tuna salad on whole wheat bread: tuna salad made with greek yogurt, olives, red peppers, capers, green onions o Garlic rosemary lamb pita: lamb sauted with garlic, rosemary, salt & pepper; add lettuce, cucumber, greek yogurt to pita - flavor with lemon juice and black pepper  . Seafood:  o Mediterranean grilled salmon, seasoned with garlic, basil, parsley, lemon juice and black pepper o Shrimp, lemon, and spinach whole-grain pasta salad made with low fat greek yogurt  o Seared scallops with lemon orzo  o Seared tuna steaks seasoned salt, pepper, coriander topped with tomato mixture of  olives, tomatoes, olive oil, minced garlic, parsley, green onions and cappers  . Meats:  o Herbed greek chicken salad with kalamata olives, cucumber, feta  o Red bell peppers stuffed with spinach, bulgur, lean ground beef (or lentils) & topped with feta   o Kebabs: skewers of chicken, tomatoes, onions, zucchini, squash  o [place] burgers: made with red onions, mint, dill, lemon juice, feta cheese topped with roasted red peppers . Vegetarian o Cucumber salad: cucumbers, artichoke hearts, celery, red onion, feta cheese, tossed in olive oil & lemon juice  o Hummus and whole grain pita points with a greek salad (lettuce, tomato, feta, olives, cucumbers, red onion) o Lentil soup with celery, carrots made with vegetable broth, garlic, salt and pepper  o Tabouli salad: parsley, bulgur, mint, scallions, cucumbers, tomato, radishes, lemon juice, olive oil, salt and pepper.

## 2015-09-10 NOTE — Progress Notes (Signed)
Pre visit review using our clinic review tool, if applicable. No additional management support is needed unless otherwise documented below in the visit note. 

## 2015-09-10 NOTE — Progress Notes (Signed)
Chief Complaint  Patient presents with  . Annual Exam    HPI: Patient  Teresa Hickman  44 y.o. comes in today for Preventive Health Care visit   And fu ht  . Blood pressure readings at home are in the 1:15 120 range on a half a dose of HCTZ per day. Often skips a day because blood pressure goes too low. No other side effects of medicine  No new sx . No cp sob   fam hx of cva poss mi in mom 73s deceased .   Tries to eat healthy   Foot is better knee sometimes bothers her using knee pads when she kneels. Health Maintenance  Topic Date Due  . HIV Screening  01/19/1985  . INFLUENZA VACCINE  07/14/2015  . PAP SMEAR  03/01/2016  . TETANUS/TDAP  08/16/2016   Health Maintenance Review LIFESTYLE:  Exercise:  lkimited by recnet foot and knee issues  Tobacco/ETS:no Alcohol: per day no Sugar beverages: juice  No soda Sleep:9 hours  Some naps after  Takes kids to school  No osa  Drug use: no  ROS:  GEN/ HEENT: No fever, significant weight changes sweats  Gets  Ha with aura no eye check no diplopia  hearing changes, CV/ PULM; No chest pain shortness of breath cough, syncope,edema  change in exercise tolerance. GI /GU: No adominal pain, vomiting, change in bowel habits. No blood in the stool. No significant GU symptoms. SKIN/HEME: ,no acute skin rashes suspicious lesions or bleeding. No lymphadenopathy, nodules, masses.  NEURO/ PSYCH:  No neurologic signs such as weakness numbness. No depression anxiety. IMM/ Allergy: No unusual infections.  Allergy .   REST of 12 system review negative except as per HPI   Past Medical History  Diagnosis Date  . Migraine     with some aura  . Hyperlipidemia   . Dysmenorrhea   . Essential hypertension 12/27/2014    No past surgical history on file.  Family History  Problem Relation Age of Onset  . Hypertension Mother   . Diabetes Mother   . Stroke Mother     67s SD  44    Social History   Social History  . Marital Status:  Married    Spouse Name: N/A  . Number of Children: N/A  . Years of Education: N/A   Social History Main Topics  . Smoking status: Never Smoker   . Smokeless tobacco: None  . Alcohol Use: No  . Drug Use: No  . Sexual Activity: No   Other Topics Concern  . None   Social History Narrative   From [place]   Medical Family   HH of 6    No pets   G4 p4     Outpatient Prescriptions Prior to Visit  Medication Sig Dispense Refill  . hydrochlorothiazide (HYDRODIURIL) 25 MG tablet Take 0.5 tablets (12.5 mg total) by mouth daily. Can increase to  1 po qd as directed 90 tablet 1  . ibuprofen (ADVIL,MOTRIN) 600 MG tablet Take 1 tablet (600 mg total) by mouth every 8 (eight) hours as needed. 15 tablet 0  . omeprazole (PRILOSEC) 20 MG capsule Take 1 capsule (20 mg total) by mouth daily. 30 capsule 0   No facility-administered medications prior to visit.     EXAM:  BP 140/92 mmHg  Pulse 108  Wt 200 lb (90.719 kg)  SpO2 99%  Body mass index is 35.97 kg/(m^2).  Physical Exam: Vital signs reviewed AVW:PVXY is a well-developed  well-nourished alert cooperative    who appearsr stated age in no acute distress.  HEENT: normocephalic atraumatic , Eyes: PERRL EOM's full, conjunctiva clear, Nares: paten,t no deformity discharge or tenderness., Ears: no deformity EAC's clear TMs with normal landmarks. Mouth: clear OP, no lesions, edema.  Moist mucous membranes. Dentition in adequate repair. NECK: supple without masses, thyromegaly or bruits. CHEST/PULM:  Clear to auscultation and percussion breath sounds equal no wheeze , rales or rhonchi. No chest wall deformities or tenderness. CV: PMI is nondisplaced, S1 S2 no gallops, murmurs, rubs. Peripheral pulses are full without delay.No JVD . Breast: normal by inspection . No dimpling, discharge, masses, tenderness or discharge . ABDOMEN: Bowel sounds normal nontender  No guard or rebound, no hepato splenomegal no CVA tenderness.  No  hernia. Extremtities:  No clubbing cyanosis or edema, no acute joint swelling or redness no focal atrophy NEURO:  Oriented x3, cranial nerves 3-12 appear to be intact, no obvious focal weakness,gait within normal limits no abnormal reflexes or asymmetrical SKIN: No acute rashes normal turgor, color, no bruising or petechiae. PSYCH: Oriented, good eye contact, no obvious depression anxiety, cognition and judgment appear normal. LN: no cervical axillary inguinal adenopathy  Lab Results  Component Value Date   WBC 7.1 09/03/2015   HGB 12.4 09/03/2015   HCT 39.1 09/03/2015   PLT 330.0 09/03/2015   GLUCOSE 76 09/03/2015   CHOL 266* 09/03/2015   TRIG 84.0 09/03/2015   HDL 52.80 09/03/2015   LDLDIRECT 176.4 10/09/2008   LDLCALC 196* 09/03/2015   ALT 13 09/03/2015   AST 12 09/03/2015   NA 140 09/03/2015   K 4.1 09/03/2015   CL 104 09/03/2015   CREATININE 0.52 09/03/2015   BUN 11 09/03/2015   CO2 32 09/03/2015   TSH 2.31 09/03/2015   BP Readings from Last 3 Encounters:  09/10/15 140/92  05/29/15 126/84  05/22/15 128/80   Wt Readings from Last 3 Encounters:  09/10/15 200 lb (90.719 kg)  05/29/15 199 lb (90.266 kg)  05/22/15 201 lb (91.173 kg)    ASSESSMENT AND PLAN:  Discussed the following assessment and plan:  Visit for preventive health examination  Essential hypertension - Plan: Amb ref to Medical Nutrition Therapy-MNT  Hyperlipidemia - Plan: Amb ref to Medical Nutrition Therapy-MNT  Family hx-stroke - mom in 50s  poss mi also   Family hx of hypertension LDL in the very high range consideration of medication however because of her age prefer intensive lifestyle intervention first. There is a family history of stroke and possible heart attack and her mom in her 25s. She may have had high blood pressure 2. Refer for nutrition attend Mediterranean diet plan recheck lipid panel BMP in about 6 months in follow-up visit.  Hypertension appears to be controlled at home has  some white coat elevation today we have had low blood pressures when trying to augment medication in the past. Other metabolic parameters appear normal. Flu vaccine today  Get eye exam   Patient Care Team: Burnis Medin, MD as PCP - General Patient Instructions    Your bp is up in office     Continue to monitor  To ensure   Below 140/90 let us know if you want [place] to send in  hctz 12.5 mg per day.   Your cholesterol is high range  . considieration of medication if remains that way .   Prefer you implement life style diet changes acitivy to control this problem .  Will be contacted  about nutrition referral   Mediterranean diet is the healthest at this time.  Plan recheck lipids and bmp in about 6 months  And ov.    Health Maintenance Adopting a healthy lifestyle and getting preventive care can go a long way to promote health and wellness. Talk with your health care provider about what schedule of regular examinations is right for you. This is a good chance for you to check in with your provider about disease prevention and staying healthy. In between checkups, there are plenty of things you can do on your own. Experts have done a lot of research about which lifestyle changes and preventive measures are most likely to keep you healthy. Ask your health care provider for more information. WEIGHT AND DIET  Eat a healthy diet  Be sure to include plenty of vegetables, fruits, low-fat dairy products, and lean protein.  Do not eat a lot of foods high in solid fats, added sugars, or salt.  Get regular exercise. This is one of the most important things you can do for your health.  Most adults should exercise for at least 150 minutes each week. The exercise should increase your heart rate and make you sweat (moderate-intensity exercise).  Most adults should also do strengthening exercises at least twice a week. This is in addition to the moderate-intensity exercise.  Maintain a healthy  weight  Body mass index (BMI) is a measurement that can be used to identify possible weight problems. It estimates body fat based on height and weight. Your health care provider can help determine your BMI and help you achieve or maintain a healthy weight.  For females 80 years of age and older:   A BMI below 18.5 is considered underweight.  A BMI of 18.5 to 24.9 is normal.  A BMI of 25 to 29.9 is considered overweight.  A BMI of 30 and above is considered obese.  Watch levels of cholesterol and blood lipids  You should start having your blood tested for lipids and cholesterol at 45 years of age, then have this test every 5 years.  You may need to have your cholesterol levels checked more often if:  Your lipid or cholesterol levels are high.  You are older than 45 years of age.  You are at high risk for heart disease.  CANCER SCREENING   Lung Cancer  Lung cancer screening is recommended for adults 36-17 years old who are at high risk for lung cancer because of a history of smoking.  A yearly low-dose CT scan of the lungs is recommended for people who:  Currently smoke.  Have quit within the past 15 years.  Have at least a 30-pack-year history of smoking. A pack year is smoking an average of one pack of cigarettes a day for 1 year.  Yearly screening should continue until it has been 15 years since you quit.  Yearly screening should stop if you develop a health problem that would prevent you from having lung cancer treatment.  Breast Cancer  Practice breast self-awareness. This means understanding how your breasts normally appear and feel.  It also means doing regular breast self-exams. Let your health care provider know about any changes, no matter how small.  If you are in your 20s or 30s, you should have a clinical breast exam (CBE) by a health care provider every 1-3 years as part of a regular health exam.  If you are 24 or older, have a CBE every year. Also  consider  having a breast X-ray (mammogram) every year.  If you have a family history of breast cancer, talk to your health care provider about genetic screening.  If you are at high risk for breast cancer, talk to your health care provider about having an MRI and a mammogram every year.  Breast cancer gene (BRCA) assessment is recommended for women who have family members with BRCA-related cancers. BRCA-related cancers include:  Breast.  Ovarian.  Tubal.  Peritoneal cancers.  Results of the assessment will determine the need for genetic counseling and BRCA1 and BRCA2 testing. Cervical Cancer Routine pelvic examinations to screen for cervical cancer are no longer recommended for nonpregnant women who are considered low risk for cancer of the pelvic organs (ovaries, uterus, and vagina) and who do not have symptoms. A pelvic examination may be necessary if you have symptoms including those associated with pelvic infections. Ask your health care provider if a screening pelvic exam is right for you.   The Pap test is the screening test for cervical cancer for women who are considered at risk.  If you had a hysterectomy for a problem that was not cancer or a condition that could lead to cancer, then you no longer need Pap tests.  If you are older than 65 years, and you have had normal Pap tests for the past 10 years, you no longer need to have Pap tests.  If you have had past treatment for cervical cancer or a condition that could lead to cancer, you need Pap tests and screening for cancer for at least 20 years after your treatment.  If you no longer get a Pap test, assess your risk factors if they change (such as having a new sexual partner). This can affect whether you should start being screened again.  Some women have medical problems that increase their chance of getting cervical cancer. If this is the case for you, your health care provider may recommend more frequent screening and Pap  tests.  The human papillomavirus (HPV) test is another test that may be used for cervical cancer screening. The HPV test looks for the virus that can cause cell changes in the cervix. The cells collected during the Pap test can be tested for HPV.  The HPV test can be used to screen women 14 years of age and older. Getting tested for HPV can extend the interval between normal Pap tests from three to five years.  An HPV test also should be used to screen women of any age who have unclear Pap test results.  After 45 years of age, women should have HPV testing as often as Pap tests.  Colorectal Cancer  This type of cancer can be detected and often prevented.  Routine colorectal cancer screening usually begins at 45 years of age and continues through 45 years of age.  Your health care provider may recommend screening at an earlier age if you have risk factors for colon cancer.  Your health care provider may also recommend using home test kits to check for hidden blood in the stool.  A small camera at the end of a tube can be used to examine your colon directly (sigmoidoscopy or colonoscopy). This is done to check for the earliest forms of colorectal cancer.  Routine screening usually begins at age 64.  Direct examination of the colon should be repeated every 5-10 years through 45 years of age. However, you may need to be screened more often if early forms of precancerous polyps  or small growths are found. Skin Cancer  Check your skin from head to toe regularly.  Tell your health care provider about any new moles or changes in moles, especially if there is a change in a mole's shape or color.  Also tell your health care provider if you have a mole that is larger than the size of a pencil eraser.  Always use sunscreen. Apply sunscreen liberally and repeatedly throughout the day.  Protect yourself by wearing long sleeves, pants, a wide-brimmed hat, and sunglasses whenever you are  outside. HEART DISEASE, DIABETES, AND HIGH BLOOD PRESSURE   Have your blood pressure checked at least every 1-2 years. High blood pressure causes heart disease and increases the risk of stroke.  If you are between 62 years and 40 years old, ask your health care provider if you should take aspirin to prevent strokes.  Have regular diabetes screenings. This involves taking a blood sample to check your fasting blood sugar level.  If you are at a normal weight and have a low risk for diabetes, have this test once every three years after 45 years of age.  If you are overweight and have a high risk for diabetes, consider being tested at a younger age or more often. PREVENTING INFECTION  Hepatitis B  If you have a higher risk for hepatitis B, you should be screened for this virus. You are considered at high risk for hepatitis B if:  You were born in a country where hepatitis B is common. Ask your health care provider which countries are considered high risk.  Your parents were born in a high-risk country, and you have not been immunized against hepatitis B (hepatitis B vaccine).  You have HIV or AIDS.  You use needles to inject street drugs.  You live with someone who has hepatitis B.  You have had sex with someone who has hepatitis B.  You get hemodialysis treatment.  You take certain medicines for conditions, including cancer, organ transplantation, and autoimmune conditions. Hepatitis C  Blood testing is recommended for:  Everyone born from [birth year] through 1966.  Anyone with known risk factors for hepatitis C. Sexually transmitted infections (STIs)  You should be screened for sexually transmitted infections (STIs) including gonorrhea and chlamydia if:  You are sexually active and are younger than 45 years of age.  You are older than 45 years of age and your health care provider tells you that you are at risk for this type of infection.  Your sexual activity has changed since  you were last screened and you are at an increased risk for chlamydia or gonorrhea. Ask your health care provider if you are at risk.  If you do not have HIV, but are at risk, it may be recommended that you take a prescription medicine daily to prevent HIV infection. This is called pre-exposure prophylaxis (PrEP). You are considered at risk if:  You are sexually active and do not regularly use condoms or know the HIV status of your partner(s).  You take drugs by injection.  You are sexually active with a partner who has HIV. Talk with your health care provider about whether you are at high risk of being infected with HIV. If you choose to begin PrEP, you should first be tested for HIV. You should then be tested every 3 months for as long as you are taking PrEP.  PREGNANCY   If you are premenopausal and you may become pregnant, ask your health care provider about  preconception counseling.  If you may become pregnant, take 400 to 800 micrograms (mcg) of folic acid every day.  If you want to prevent pregnancy, talk to your health care provider about birth control (contraception). OSTEOPOROSIS AND MENOPAUSE   Osteoporosis is a disease in which the bones lose minerals and strength with aging. This can result in serious bone fractures. Your risk for osteoporosis can be identified using a bone density scan.  If you are 52 years of age or older, or if you are at risk for osteoporosis and fractures, ask your health care provider if you should be screened.  Ask your health care provider whether you should take a calcium or vitamin D supplement to lower your risk for osteoporosis.  Menopause may have certain physical symptoms and risks.  Hormone replacement therapy may reduce some of these symptoms and risks. Talk to your health care provider about whether hormone replacement therapy is right for you.  HOME CARE INSTRUCTIONS   Schedule regular health, dental, and eye exams.  Stay current with  your immunizations.   Do not use any tobacco products including cigarettes, chewing tobacco, or electronic cigarettes.  If you are pregnant, do not drink alcohol.  If you are breastfeeding, limit how much and how often you drink alcohol.  Limit alcohol intake to no more than 1 drink per day for nonpregnant women. One drink equals 12 ounces of beer, 5 ounces of wine, or 1 ounces of hard liquor.  Do not use street drugs.  Do not share needles.  Ask your health care provider for help if you need support or information about quitting drugs.  Tell your health care provider if you often feel depressed.  Tell your health care provider if you have ever been abused or do not feel safe at home. Document Released: 06/14/2011 Document Revised: 04/15/2014 Document Reviewed: 10/31/2013 [facility] Patient Information 2015 [place], [place]. This information is not intended to replace advice given to you by your health care provider. Make sure you discuss any questions you have with your health care provider.  Stroke Prevention Some medical conditions and behaviors are associated with an increased chance of having a stroke. You may prevent a stroke by making healthy choices and managing medical conditions. HOW CAN I REDUCE MY RISK OF HAVING A STROKE?   Stay physically active. Get at least 30 minutes of activity on most or all days.  Do not smoke. It may also be helpful to avoid exposure to secondhand smoke.  Limit alcohol use. Moderate alcohol use is considered to be:  No more than 2 drinks per day for men.  No more than 1 drink per day for nonpregnant women.  Eat healthy foods. This involves:  Eating 5 or more servings of fruits and vegetables a day.  Making dietary changes that address high blood pressure (hypertension), high cholesterol, diabetes, or obesity.  Manage your cholesterol levels.  Making food choices that are high in fiber and low in saturated fat, trans fat, and cholesterol  may control cholesterol levels.  Take any prescribed medicines to control cholesterol as directed by your health care provider.  Manage your diabetes.  Controlling your carbohydrate and sugar intake is recommended to manage diabetes.  Take any prescribed medicines to control diabetes as directed by your health care provider.  Control your hypertension.  Making food choices that are low in salt (sodium), saturated fat, trans fat, and cholesterol is recommended to manage hypertension.  Take any prescribed medicines to control hypertension as  directed by your health care provider.  Maintain a healthy weight.  Reducing calorie intake and making food choices that are low in sodium, saturated fat, trans fat, and cholesterol are recommended to manage weight.  Stop drug abuse.  Avoid taking birth control pills.  Talk to your health care provider about the risks of taking birth control pills if you are over 38 years old, smoke, get migraines, or have ever had a blood clot.  Get evaluated for sleep disorders (sleep apnea).  Talk to your health care provider about getting a sleep evaluation if you snore a lot or have excessive sleepiness.  Take medicines only as directed by your health care provider.  For some people, aspirin or blood thinners (anticoagulants) are helpful in reducing the risk of forming abnormal blood clots that can lead to stroke. If you have the irregular heart rhythm of atrial fibrillation, you should be on a blood thinner unless there is a good reason you cannot take them.  Understand all your medicine instructions.  Make sure that other conditions (such as anemia or atherosclerosis) are addressed. SEEK IMMEDIATE MEDICAL CARE IF:   You have sudden weakness or numbness of the face, arm, or leg, especially on one side of the body.  Your face or eyelid droops to one side.  You have sudden confusion.  You have trouble speaking (aphasia) or understanding.  You have  sudden trouble seeing in one or both eyes.  You have sudden trouble walking.  You have dizziness.  You have a loss of balance or coordination.  You have a sudden, severe headache with no known cause.  You have new chest pain or an irregular heartbeat. Any of these symptoms may represent a serious problem that is an emergency. Do not wait to see if the symptoms will go away. Get medical help at once. Call your local emergency services (911 in [place] Do not drive yourself to the hospital. Document Released: 01/06/2005 Document Revised: 04/15/2014 Document Reviewed: 06/01/2013 [facility] Patient Information 2015 [place], [place]. This information is not intended to replace advice given to you by your health care provider. Make sure you discuss any questions you have with your health care provider.    Why follow it? Research shows. . Those who follow the Mediterranean diet have a reduced risk of heart disease  . The diet is associated with a reduced incidence of Parkinson's and Alzheimer's diseases . People following the diet may have longer life expectancies and lower rates of chronic diseases  . The Dietary Guidelines for Americans recommends the Mediterranean diet as an eating plan to promote health and prevent disease  What Is the Mediterranean Diet?  . Healthy eating plan based on typical foods and recipes of Mediterranean-style cooking . The diet is primarily a plant based diet; these foods should make up a majority of meals   Starches - Plant based foods should make up a majority of meals - They are an important sources of vitamins, minerals, energy, antioxidants, and fiber - Choose whole grains, foods high in fiber and minimally processed items  - Typical grain sources include wheat, oats, barley, corn, brown rice, bulgar, farro, millet, polenta, couscous  - Various types of beans include chickpeas, lentils, fava beans, black beans, white beans   Fruits  Veggies - Large quantities of  antioxidant rich fruits & veggies; 6 or more servings  - Vegetables can be eaten raw or lightly drizzled with oil and cooked  - Vegetables common to the traditional Mediterranean  Diet include: artichokes, arugula, beets, broccoli, brussel sprouts, cabbage, carrots, celery, collard greens, cucumbers, eggplant, kale, leeks, lemons, lettuce, mushrooms, okra, onions, peas, peppers, potatoes, pumpkin, radishes, rutabaga, shallots, spinach, sweet potatoes, turnips, zucchini - Fruits common to the Mediterranean Diet include: apples, apricots, avocados, cherries, clementines, dates, figs, grapefruits, grapes, melons, nectarines, oranges, peaches, pears, pomegranates, strawberries, tangerines  Fats - Replace butter and margarine with healthy oils, such as olive oil, canola oil, and tahini  - Limit nuts to no more than a handful a day  - Nuts include walnuts, almonds, pecans, pistachios, pine nuts  - Limit or avoid candied, honey roasted or heavily salted nuts - Olives are central to the [employer] - can be eaten whole or used in a variety of dishes   Meats Protein - Limiting red meat: no more than a few times a month - When eating red meat: choose lean cuts and keep the portion to the size of deck of cards - Eggs: approx. 0 to 4 times a week  - Fish and lean poultry: at least 2 a week  - Healthy protein sources include, chicken, [place], lean beef, lamb - Increase intake of seafood such as tuna, salmon, trout, mackerel, shrimp, scallops - Avoid or limit high fat processed meats such as sausage and bacon  Dairy - Include moderate amounts of low fat dairy products  - Focus on healthy dairy such as fat free yogurt, skim milk, low or reduced fat cheese - Limit dairy products higher in fat such as whole or 2% milk, cheese, ice cream  Alcohol - Moderate amounts of red wine is ok  - No more than 5 oz daily for women (all ages) and men older than age 15  - No more than 10 oz of wine daily for men younger  than 43  Other - Limit sweets and other desserts  - Use herbs and spices instead of salt to flavor foods  - Herbs and spices common to the traditional Mediterranean Diet include: basil, bay leaves, chives, cloves, cumin, fennel, garlic, lavender, marjoram, mint, oregano, parsley, pepper, rosemary, sage, savory, sumac, tarragon, thyme   It's not just a diet, it's a lifestyle:  . The Mediterranean diet includes lifestyle factors typical of those in the region  . Foods, drinks and meals are best eaten with others and savored . Daily physical activity is important for overall good health . This could be strenuous exercise like running and aerobics . This could also be more leisurely activities such as walking, housework, yard-work, or taking the stairs . Moderation is the key; a balanced and healthy diet accommodates most foods and drinks . Consider portion sizes and frequency of consumption of certain foods   Meal Ideas & Options:  . Breakfast:  o Whole wheat toast or whole wheat English muffins with peanut butter & hard boiled egg o Steel cut oats topped with apples & cinnamon and skim milk  o Fresh fruit: banana, strawberries, melon, berries, peaches  o Smoothies: strawberries, bananas, greek yogurt, peanut butter o Low fat greek yogurt with blueberries and granola  o Egg white omelet with spinach and mushrooms o Breakfast couscous: whole wheat couscous, apricots, skim milk, cranberries  . Sandwiches:  o Hummus and grilled vegetables (peppers, zucchini, squash) on whole wheat bread   o Grilled chicken on whole wheat pita with lettuce, tomatoes, cucumbers or tzatziki  o Tuna salad on whole wheat bread: tuna salad made with greek yogurt, olives, red peppers, capers, green onions o  Garlic rosemary lamb pita: lamb sauted with garlic, rosemary, salt & pepper; add lettuce, cucumber, greek yogurt to pita - flavor with lemon juice and black pepper  . Seafood:  o Mediterranean grilled salmon,  seasoned with garlic, basil, parsley, lemon juice and black pepper o Shrimp, lemon, and spinach whole-grain pasta salad made with low fat greek yogurt  o Seared scallops with lemon orzo  o Seared tuna steaks seasoned salt, pepper, coriander topped with tomato mixture of olives, tomatoes, olive oil, minced garlic, parsley, green onions and cappers  . Meats:  o Herbed greek chicken salad with kalamata olives, cucumber, feta  o Red bell peppers stuffed with spinach, bulgur, lean ground beef (or lentils) & topped with feta   o Kebabs: skewers of chicken, tomatoes, onions, zucchini, squash  o [place] burgers: made with red onions, mint, dill, lemon juice, feta cheese topped with roasted red peppers . Vegetarian o Cucumber salad: cucumbers, artichoke hearts, celery, red onion, feta cheese, tossed in olive oil & lemon juice  o Hummus and whole grain pita points with a greek salad (lettuce, tomato, feta, olives, cucumbers, red onion) o Lentil soup with celery, carrots made with vegetable broth, garlic, salt and pepper  o Tabouli salad: parsley, bulgur, mint, scallions, cucumbers, tomato, radishes, lemon juice, olive oil, salt and pepper.         Standley Brooking. Panosh M.D.

## 2015-10-10 ENCOUNTER — Encounter: Payer: Self-pay | Admitting: Skilled Nursing Facility1

## 2015-10-10 ENCOUNTER — Encounter: Payer: 59 | Attending: Internal Medicine | Admitting: Skilled Nursing Facility1

## 2015-10-10 VITALS — Ht 66.0 in | Wt 199.0 lb

## 2015-10-10 DIAGNOSIS — Z713 Dietary counseling and surveillance: Secondary | ICD-10-CM | POA: Diagnosis not present

## 2015-10-10 DIAGNOSIS — E7800 Pure hypercholesterolemia, unspecified: Secondary | ICD-10-CM | POA: Diagnosis not present

## 2015-10-10 NOTE — Progress Notes (Signed)
  Medical Nutrition Therapy:  Appt start time: 1000 end time:  1030.   Assessment:  Primary concerns today: referred for hyperlipidemia.Pt states her physician told her her cholesterol is high. Pt states she does not want to discuss anything else other than dietary strategies of how to lower her cholesterol. Pt is muslim.  Preferred Learning Style:   No preference indicated   Learning Readiness:   Ready  MEDICATIONS: See List   DIETARY INTAKE:  Usual eating pattern includes 3 meals and 2 snacks per day.  Everyday foods include none stated.  Avoided foods include pork.    24-hr recall:  B ( AM): 2 pieces of bread Snk ( AM): fruit L ( PM): rice, chicken, salad Snk ( PM): fruit D ( PM): vegetables, carbohydrate Snk ( PM): none----ice cream---peanut Beverages: coffee, lemonade, juice, water  Usual physical activity: treadmill 2 days a week for 30 minutes  Estimated energy needs: 1800 calories 200 g carbohydrates 135 g protein 50 g fat  Progress Towards Goal(s):  In progress.   Nutritional Diagnosis:  NB-1.1 Food and nutrition-related knowledge deficit As related to new diagnosis of hypercholesterolemia.  As evidenced by LDL 196, pt report.    Intervention:  Nutrition counseling for hypercholesterolemia. Dietitian educated the pt on what cholesterol is, how the body processes it, and possible causes of increased cholesterol in the blood. Goals: -Try to increase your nuts and fish -Decrease animal product consumption -Increase your physical activity to 4-5 days a week  Teaching Method Utilized:  Visual Auditory   Handouts given during visit include:  MyPlate Barriers to learning/adherence to lifestyle change: none identified  Demonstrated degree of understanding via:  Teach Back   Monitoring/Evaluation:  Dietary intake, exercise, LDL, and body weight prn.

## 2016-01-15 ENCOUNTER — Ambulatory Visit: Payer: 59 | Admitting: Internal Medicine

## 2016-02-24 ENCOUNTER — Other Ambulatory Visit: Payer: Self-pay | Admitting: Family Medicine

## 2016-02-24 MED ORDER — HYDROCHLOROTHIAZIDE 25 MG PO TABS: 12.5000 mg | ORAL_TABLET | Freq: Every day | ORAL | Status: DC

## 2016-02-24 NOTE — Telephone Encounter (Signed)
Sent to the pharmacy by e-scribe. 

## 2016-03-02 ENCOUNTER — Other Ambulatory Visit (INDEPENDENT_AMBULATORY_CARE_PROVIDER_SITE_OTHER): Payer: 59

## 2016-03-02 ENCOUNTER — Other Ambulatory Visit: Payer: 59

## 2016-03-02 DIAGNOSIS — I10 Essential (primary) hypertension: Secondary | ICD-10-CM | POA: Diagnosis not present

## 2016-03-02 DIAGNOSIS — E785 Hyperlipidemia, unspecified: Secondary | ICD-10-CM | POA: Diagnosis not present

## 2016-03-02 LAB — BASIC METABOLIC PANEL
BUN: 10 mg/dL (ref 6–23)
CO2: 30 meq/L (ref 19–32)
Calcium: 9.4 mg/dL (ref 8.4–10.5)
Chloride: 99 mEq/L (ref 96–112)
Creatinine, Ser: 0.57 mg/dL (ref 0.40–1.20)
GFR: 146.78 mL/min (ref >60.00)
Glucose, Bld: 86 mg/dL (ref 70–99)
POTASSIUM: 3.5 meq/L (ref 3.5–5.1)
Sodium: 138 mEq/L (ref 135–145)

## 2016-03-02 LAB — LIPID PANEL
CHOL/HDL RATIO: 5
CHOLESTEROL: 261 mg/dL — AB (ref 0–200)
HDL: 48.9 mg/dL (ref >39.00)
LDL CALC: 198 mg/dL — AB (ref 0–99)
NonHDL: 211.66
TRIGLYCERIDES: 70 mg/dL (ref 0.0–149.0)
VLDL: 14 mg/dL (ref 0.0–40.0)

## 2016-03-09 ENCOUNTER — Ambulatory Visit (INDEPENDENT_AMBULATORY_CARE_PROVIDER_SITE_OTHER): Payer: 59 | Admitting: Internal Medicine

## 2016-03-09 ENCOUNTER — Encounter: Payer: Self-pay | Admitting: Internal Medicine

## 2016-03-09 VITALS — BP 144/95 | HR 93 | Temp 98.5°F | Wt 203.1 lb

## 2016-03-09 DIAGNOSIS — E785 Hyperlipidemia, unspecified: Secondary | ICD-10-CM | POA: Diagnosis not present

## 2016-03-09 DIAGNOSIS — I10 Essential (primary) hypertension: Secondary | ICD-10-CM | POA: Diagnosis not present

## 2016-03-09 DIAGNOSIS — R079 Chest pain, unspecified: Secondary | ICD-10-CM

## 2016-03-09 MED ORDER — ATORVASTATIN CALCIUM 20 MG PO TABS: 20.0000 mg | ORAL_TABLET | Freq: Every day | ORAL | Status: DC

## 2016-03-09 NOTE — Progress Notes (Signed)
Chief Complaint  Patient presents with  . Follow-up    HPI: Teresa Hickman 45 y.o.  And Chronic disease management Also talked with her husband  md  on cell  Has gone to nutrition and tried to cut out  Some foods . Has intermittent chest pain cw type   ? If cycles  At the time of period noted bu husband  No cough ibuprofen not that effects but is sharp  Had an ed visit with neg d dimer and ekg and neg troponin   Still is problematic and worrisome   Tried ppi once / may have helped   HT reported to go up when in pain mostly  Has been ok  Hx of low readings when aggressively rx  ROS: See pertinent positives and negatives per HPI. Periods last usua 4-5 days but had one that lasted 10 days  In recent past.   Past Medical History  Diagnosis Date  . Migraine     with some aura  . Hyperlipidemia   . Dysmenorrhea   . Essential hypertension 12/27/2014    Family History  Problem Relation Age of Onset  . Hypertension Mother   . Diabetes Mother   . Stroke Mother     73s SD  55    Social History   Social History  . Marital Status: Married    Spouse Name: N/A  . Number of Children: N/A  . Years of Education: N/A   Social History Main Topics  . Smoking status: Never Smoker   . Smokeless tobacco: None  . Alcohol Use: No  . Drug Use: No  . Sexual Activity: No   Other Topics Concern  . None   Social History Narrative   From [place]   Medical Family   HH of 6    No pets   G4 p4     Outpatient Prescriptions Prior to Visit  Medication Sig Dispense Refill  . hydrochlorothiazide (HYDRODIURIL) 25 MG tablet Take 0.5 tablets (12.5 mg total) by mouth daily. Can increase to  1 po qd as directed 90 tablet 1  . ibuprofen (ADVIL,MOTRIN) 600 MG tablet Take 1 tablet (600 mg total) by mouth every 8 (eight) hours as needed. 15 tablet 0  . omeprazole (PRILOSEC) 20 MG capsule Take 1 capsule (20 mg total) by mouth daily. 30 capsule 0   No facility-administered medications prior to  visit.     EXAM:  BP 144/95 mmHg  Pulse 93  Temp(Src) 98.5 F (36.9 C) (Oral)  Wt 203 lb 1.6 oz (92.126 kg)  Body mass index is 32.8 kg/(m^2).  GENERAL: vitals reviewed and listed above, alert, oriented, appears well hydrated and in no acute distress HEENT: atraumatic, conjunctiva  clear, no obvious abnormalities on inspection of external nose and ears OP : no lesion edema or exudate  NECK: no obvious masses on inspection palpation  LUNGS: clear to auscultation bilaterally, no wheezes, rales or rhonchi, good air movement CV: HRRR, no clubbing cyanosis or  peripheral edema nl cap refill  Abdomen:  Sof,t normal bowel sounds without hepatosplenomegaly, no guarding rebound or masses no CVA tenderness MS: moves all extremities without noticeable focal  abnormality PSYCH: pleasant and cooperative, no obvious depression or anxiety Lab Results  Component Value Date   WBC 7.1 09/03/2015   HGB 12.4 09/03/2015   HCT 39.1 09/03/2015   PLT 330.0 09/03/2015   GLUCOSE 86 03/02/2016   CHOL 261* 03/02/2016   TRIG 70.0 03/02/2016   HDL  48.90 03/02/2016   LDLDIRECT 176.4 10/09/2008   LDLCALC 198* 03/02/2016   ALT 13 09/03/2015   AST 12 09/03/2015   NA 138 03/02/2016   K 3.5 03/02/2016   CL 99 03/02/2016   CREATININE 0.57 03/02/2016   BUN 10 03/02/2016   CO2 30 03/02/2016   TSH 2.31 09/03/2015   BP Readings from Last 3 Encounters:  03/09/16 144/95  09/10/15 140/92  05/29/15 126/84   Wt Readings from Last 3 Encounters:  03/09/16 203 lb 1.6 oz (92.126 kg)  10/10/15 199 lb (90.266 kg)  09/10/15 200 lb (90.719 kg)    ASSESSMENT AND PLAN:  Discussed the following assessment and plan:  Hyperlipidemia - ldl over 190 x 2 add statin med warned to avoid preganacy  check in 3 months   Chest pain, unspecified chest pain type - atypical ? catemenial by husbands observation , get cards opinion ? advisability for stress test etc   Essential hypertension  -Patient advised to return or  notify health care team  if symptoms worsen ,persist or new concerns arise.  Patient Instructions  Continue lifestyle intervention healthy eating and exercise . Add lipitor  Cholesterol medication ( not to get preganat on this medication)  Will get cardiology  opinion poss stress test .   Track you r pain days  In assoc with cycle  Ok to take acid blocker in interim  To see if helps .   Lipid panel in  3 months and then [place]. Teresa Hickman M.D.

## 2016-03-09 NOTE — Progress Notes (Signed)
Pre visit review using our clinic review tool, if applicable. No additional management support is needed unless otherwise documented below in the visit note. 

## 2016-03-09 NOTE — Patient Instructions (Signed)
Continue lifestyle intervention healthy eating and exercise . Add lipitor  Cholesterol medication ( not to get preganat on this medication)  Will get cardiology  opinion poss stress test .   Track you r pain days  In assoc with cycle  Ok to take acid blocker in interim  To see if helps .   Lipid panel in  3 months and then [facility]

## 2016-04-12 ENCOUNTER — Encounter: Payer: Self-pay | Admitting: Cardiology

## 2016-04-12 ENCOUNTER — Ambulatory Visit (INDEPENDENT_AMBULATORY_CARE_PROVIDER_SITE_OTHER): Payer: 59 | Admitting: Cardiology

## 2016-04-12 VITALS — BP 132/90 | HR 108 | Ht 66.0 in | Wt 198.8 lb

## 2016-04-12 DIAGNOSIS — E785 Hyperlipidemia, unspecified: Secondary | ICD-10-CM

## 2016-04-12 DIAGNOSIS — I10 Essential (primary) hypertension: Secondary | ICD-10-CM | POA: Diagnosis not present

## 2016-04-12 DIAGNOSIS — R079 Chest pain, unspecified: Secondary | ICD-10-CM | POA: Diagnosis not present

## 2016-04-12 NOTE — Patient Instructions (Addendum)
Medication Instructions:  Your physician recommends that you continue on your current medications as directed. Please refer to the Current Medication list given to you today.   Labwork: None  Testing/Procedures: Your physician has requested that you have an echocardiogram. Echocardiography is a painless test that uses sound waves to create images of your heart. It provides your doctor with information about the size and shape of your heart and how well your heart's chambers and valves are working. This procedure takes approximately one hour. There are no restrictions for this procedure.   Dr. Turner recommends you have a NUCLEAR STRESS TEST.  Follow-Up: Your physician recommends that you schedule a follow-up appointment AS NEEDED with Dr. Turner pending study results.  Any Other Special Instructions Will Be Listed Below (If Applicable).     If you need a refill on your cardiac medications before your next appointment, please call your pharmacy.   

## 2016-04-12 NOTE — Progress Notes (Signed)
Cardiology Office Note    Date:  04/12/2016   ID:  KRISELLE HEAVILIN, DOB 12/17/1970, MRN [id number]  PCP:  Teresa Dawson, MD  Cardiologist:  Teresa Margarita, MD   Chief Complaint  Patient presents with  . Chest Pain    History of Present Illness:  Teresa Hickman is a 45 y.o. female with a history of dyslipidemia and HTN who presents today for evaluation of chest pain.  She says it is very intermittent.  This has been going on for over a year and actually went to the ER about a year ago.  She says it is more frequent around her period.  She describes the pain as a pressure in her upper right chest and upper midsternal region.  There is no radiation of the pain and it usually lasts intermittently for a few days and then resolve.  She denies SOB, diaphoresis or nausea with the pain.  She occasionally notices some racing of her heart beat.  She denies any dizziness, LE edema, claudication or syncope.      Past Medical History  Diagnosis Date  . Migraine     with some aura  . Hyperlipidemia   . Dysmenorrhea   . Essential hypertension 12/27/2014    Past Surgical History  Procedure Laterality Date  . No past surgeries      Current Medications: Outpatient Prescriptions Prior to Visit  Medication Sig Dispense Refill  . atorvastatin (LIPITOR) 20 MG tablet Take 1 tablet (20 mg total) by mouth daily. 90 tablet 1  . omeprazole (PRILOSEC) 20 MG capsule Take 1 capsule (20 mg total) by mouth daily. 30 capsule 0  . hydrochlorothiazide (HYDRODIURIL) 25 MG tablet Take 0.5 tablets (12.5 mg total) by mouth daily. Can increase to  1 po qd as directed 90 tablet 1  . ibuprofen (ADVIL,MOTRIN) 600 MG tablet Take 1 tablet (600 mg total) by mouth every 8 (eight) hours as needed. 15 tablet 0   No facility-administered medications prior to visit.     Allergies:   Review of patient's allergies indicates no known allergies.   Social History   Social History  . Marital Status: Married    Spouse  Name: N/A  . Number of Children: N/A  . Years of Education: N/A   Social History Main Topics  . Smoking status: Never Smoker   . Smokeless tobacco: None  . Alcohol Use: No  . Drug Use: No  . Sexual Activity: No   Other Topics Concern  . None   Social History Narrative   From [place]   Medical Family   HH of 6    No pets   G4 p4      Family History:  The patient's family history includes Diabetes in her mother; Hypertension in her mother; Stroke in her mother.   ROS:   Please see the history of present illness.    ROS All other systems reviewed and are negative.   PHYSICAL EXAM:   VS:  BP 132/90 mmHg  Pulse 108  Ht 5\' 6"  (1.676 m)  Wt 198 lb 12.8 oz (90.175 kg)  BMI 32.10 kg/m2   GEN: Well nourished, well developed, in no acute distress HEENT: normal Neck: no JVD, carotid bruits, or masses Cardiac: RRR; no murmurs, rubs, or gallops,no edema.  Intact distal pulses bilaterally.  Respiratory:  clear to auscultation bilaterally, normal work of breathing GI: soft, nontender, nondistended, + BS MS: no deformity or atrophy Skin: warm and dry, no rash  Neuro:  Alert and Oriented x 3, Strength and sensation are intact Psych: euthymic mood, full affect  Wt Readings from Last 3 Encounters:  04/12/16 198 lb 12.8 oz (90.175 kg)  03/09/16 203 lb 1.6 oz (92.126 kg)  10/10/15 199 lb (90.266 kg)      Studies/Labs Reviewed:   EKG:  EKG is  ordered today.  The ekg ordered today demonstrates NSR with nonspecific T wave abnormality.  Recent Labs: 09/03/2015: ALT 13; Hemoglobin 12.4; Platelets 330.0; TSH 2.31 03/02/2016: BUN 10; Creatinine, Ser 0.57; Potassium 3.5; Sodium 138   Lipid Panel    Component Value Date/Time   CHOL 261* 03/02/2016 1125   TRIG 70.0 03/02/2016 1125   HDL 48.90 03/02/2016 1125   CHOLHDL 5 03/02/2016 1125   VLDL 14.0 03/02/2016 1125   LDLCALC 198* 03/02/2016 1125   LDLDIRECT 176.4 10/09/2008 1200    Additional studies/ records that were reviewed  today include:  Office notes from PCP    ASSESSMENT:    1. Chest pain, unspecified chest pain type   2. Essential hypertension   3. Hyperlipidemia      PLAN:  In order of problems listed above:  1. Chest pain that sounds atypical and more musculoskeletal in nature.  She does have cardiac risk factors including HTN and dyslipidemia.  She does not smoke.  She has no documented family history of CAD but her mom died a few years ago suddenly in [place].  EKG shows nonspecific T wave abnormality.  I will get a nuclear stress test to assess further and 2D echo to assess LVF.   2. HTN - BP is well controlled on diuretic 3. Hyperlipidemia- continue statin    Medication Adjustments/Labs and Tests Ordered: Current medicines are reviewed at length with the patient today.  Concerns regarding medicines are outlined above.  Medication changes, Labs and Tests ordered today are listed in the Patient Instructions below.   Lurena Nida, MD  04/12/2016 9:24 AM    [facility] [address], [place], [place]  [address] Phone: [phone]; Fax: [phone]

## 2016-04-20 ENCOUNTER — Other Ambulatory Visit: Payer: Self-pay | Admitting: Internal Medicine

## 2016-04-20 MED ORDER — ATORVASTATIN CALCIUM 20 MG PO TABS: 20.0000 mg | ORAL_TABLET | Freq: Every day | ORAL | Status: DC

## 2016-04-20 NOTE — Progress Notes (Signed)
husband  Dr Jonelle Sidle called about rx  Cost 150 $ at [facility]   To sent to [facility]  Avoid excess cost. rx sent in Will send in  Encouraged use of my chart in future

## 2016-04-29 ENCOUNTER — Other Ambulatory Visit: Payer: Self-pay

## 2016-04-29 ENCOUNTER — Ambulatory Visit (HOSPITAL_COMMUNITY): Payer: 59 | Attending: Cardiology

## 2016-04-29 DIAGNOSIS — R079 Chest pain, unspecified: Secondary | ICD-10-CM | POA: Diagnosis not present

## 2016-04-29 DIAGNOSIS — I351 Nonrheumatic aortic (valve) insufficiency: Secondary | ICD-10-CM | POA: Diagnosis not present

## 2016-04-29 DIAGNOSIS — E785 Hyperlipidemia, unspecified: Secondary | ICD-10-CM | POA: Diagnosis not present

## 2016-04-29 DIAGNOSIS — I10 Essential (primary) hypertension: Secondary | ICD-10-CM | POA: Insufficient documentation

## 2016-05-03 ENCOUNTER — Telehealth (HOSPITAL_COMMUNITY): Payer: Self-pay | Admitting: *Deleted

## 2016-05-03 NOTE — Telephone Encounter (Signed)
Patient given detailed instructions per Myocardial Perfusion Study Information Sheet for the test on 05/06/16 at 0945. Patient notified to arrive 15 minutes early and that it is imperative to arrive on time for appointment to keep from having the test rescheduled.  If you need to cancel or reschedule your appointment, please call the office within 24 hours of your appointment. Failure to do so may result in a cancellation of your appointment, and a $50 no show fee. Patient verbalized understanding.Printice Hellmer, Ranae Palms

## 2016-05-04 NOTE — Telephone Encounter (Signed)
-----   Message from Sueanne Margarita, MD sent at 05/02/2016  6:25 PM EDT ----- Normal LF with mild AR and trivial TR

## 2016-05-04 NOTE — Telephone Encounter (Signed)
This encounter was created in error - please disregard.

## 2016-05-06 ENCOUNTER — Ambulatory Visit (HOSPITAL_COMMUNITY): Payer: 59 | Attending: Cardiovascular Disease

## 2016-05-06 DIAGNOSIS — I10 Essential (primary) hypertension: Secondary | ICD-10-CM | POA: Insufficient documentation

## 2016-05-06 DIAGNOSIS — R079 Chest pain, unspecified: Secondary | ICD-10-CM | POA: Insufficient documentation

## 2016-05-06 LAB — MYOCARDIAL PERFUSION IMAGING
CSEPED: 5 min
CSEPEW: 7 METS
CSEPHR: 93 %
CSEPPHR: 162 {beats}/min
Exercise duration (sec): 45 s
LHR: 0.35
LV dias vol: 74 mL (ref 46–106)
LVSYSVOL: 21 mL
MPHR: 174 {beats}/min
Rest HR: 83 {beats}/min
SDS: 4
SRS: 1
SSS: 5
TID: 0.94

## 2016-05-06 MED ORDER — TECHNETIUM TC 99M TETROFOSMIN IV KIT
32.6000 | PACK | Freq: Once | INTRAVENOUS | Status: AC | PRN
  Administered 2016-05-06: 33 via INTRAVENOUS
  Filled 2016-05-06: qty 33

## 2016-05-06 MED ORDER — TECHNETIUM TC 99M TETROFOSMIN IV KIT
11.0000 | PACK | Freq: Once | INTRAVENOUS | Status: AC | PRN
  Administered 2016-05-06: 11 via INTRAVENOUS
  Filled 2016-05-06: qty 11

## 2016-05-07 ENCOUNTER — Telehealth: Payer: Self-pay | Admitting: Cardiology

## 2016-05-07 NOTE — Telephone Encounter (Signed)
New message   Pt verbalized that she is returning a call to [facility]

## 2016-05-07 NOTE — Telephone Encounter (Signed)
ECHO and myoview results reviewed with patient.

## 2016-05-23 NOTE — Progress Notes (Signed)
Pre visit review using our clinic review tool, if applicable. No additional management support is needed unless otherwise documented below in the visit note.  Chief Complaint  Patient presents with  . Back Pain    Has pain in her lower back and upper back and shoulders.  Pain comes and goes.  Has treated with ibuprofen and Tylenol with no help.    HPI: Teresa Hickman 45 y.o.  comes in for  Problem For the last few months off and on but more problematic recently she has had back pain midline upper at the base of her neck into her shoulders and down her arms to about the elbow right more than left. At times her children have to massage her neck and muscle area. She describes the pain as a burning pain denies any injury uncertain if she feels weak. She has no head injury. She had had some left atypical chest pain and saw cardiology who did not think it was vascular and possibly chest wall. This pain is not nocturnal and there is no numbness. However she seems to notice it worse when she's standing long periods of time perhaps repairing things in the kitchen.  She has tried off the atorvastatin for 3 days and has noted no difference. She thinks it's around her shoulder joints. She is taking Tylenol and ibuprofen round the clock with no help or change. ROS: See pertinent positives and negatives per HPI.  Past Medical History  Diagnosis Date  . Migraine     with some aura  . Hyperlipidemia   . Dysmenorrhea   . Essential hypertension 12/27/2014    Family History  Problem Relation Age of Onset  . Hypertension Mother   . Diabetes Mother   . Stroke Mother     74s SD  35    Social History   Social History  . Marital Status: Married    Spouse Name: N/A  . Number of Children: N/A  . Years of Education: N/A   Social History Main Topics  . Smoking status: Never Smoker   . Smokeless tobacco: Not on file  . Alcohol Use: No  . Drug Use: No  . Sexual Activity: No   Other Topics Concern    . Not on file   Social History Narrative   From [place]   Medical Family   HH of 6    No pets   G4 p4     Outpatient Prescriptions Prior to Visit  Medication Sig Dispense Refill  . atorvastatin (LIPITOR) 20 MG tablet Take 1 tablet (20 mg total) by mouth daily. 90 tablet 1  . hydrochlorothiazide (HYDRODIURIL) 25 MG tablet Take 12.5 mg by mouth daily.    Marland Kitchen ibuprofen (ADVIL,MOTRIN) 600 MG tablet Take 600 mg by mouth every 8 (eight) hours as needed for mild pain.    Marland Kitchen omeprazole (PRILOSEC) 20 MG capsule Take 1 capsule (20 mg total) by mouth daily. 30 capsule 0   No facility-administered medications prior to visit.     EXAM:  BP 130/80 mmHg  Temp(Src) 98.1 F (36.7 C) (Oral)  Wt 197 lb 1.6 oz (89.404 kg)  Body mass index is 31.83 kg/(m^2).  GENERAL: vitals reviewed and listed above, alert, oriented, appears well hydrated and in no acute distress HEENT: atraumatic, conjunctiva  clear, no obvious abnormalities on inspection of external nose and ears NECK: no obvious masses on inspection palpation there is some mild foreshortening tenderness at the base of the C-spine and upper thoracic  but no point tenderness. Range of motion of shoulders appears to be normal but she does have some discomfort. LUNGS: clear to auscultation bilaterally, no wheezes, rales or rhonchi, good air movement CV: HRRR, no clubbing cyanosis or  peripheral edema nl cap refill  MS: moves all extremities without noticeable focal  Abnormality muscle strength upper extremities normal but left is stronger than right. No obvious focal neurologic deficits. PSYCH: pleasant and cooperative, no obvious depression or anxiety Lab Results  Component Value Date   WBC 7.1 09/03/2015   HGB 12.4 09/03/2015   HCT 39.1 09/03/2015   PLT 330.0 09/03/2015   GLUCOSE 86 03/02/2016   CHOL 261* 03/02/2016   TRIG 70.0 03/02/2016   HDL 48.90 03/02/2016   LDLDIRECT 176.4 10/09/2008   LDLCALC 198* 03/02/2016   ALT 13 09/03/2015    AST 12 09/03/2015   NA 138 03/02/2016   K 3.5 03/02/2016   CL 99 03/02/2016   CREATININE 0.57 03/02/2016   BUN 10 03/02/2016   CO2 30 03/02/2016   TSH 2.31 09/03/2015   Wt Readings from Last 3 Encounters:  05/24/16 197 lb 1.6 oz (89.404 kg)  05/06/16 198 lb (89.812 kg)  04/12/16 198 lb 12.8 oz (90.175 kg)   BP Readings from Last 3 Encounters:  05/24/16 130/80  04/12/16 132/90  03/09/16 144/95    ASSESSMENT AND PLAN:  Discussed the following assessment and plan:  Neck and shoulder pain - Plan: Ambulatory referral to Sports Medicine  Bilateral shoulder pain - Plan: Ambulatory referral to Sports Medicine  Essential hypertension  Hyperlipidemia  Uncertain cause possible cervical based symptoms versus shoulder. These are upper thoracic versus cervical. Quality appears to be burning and more like neuropathic pain. I doubt it is from her statin medicines which she just started a month ago but she can try off a whole week before restarting. Trial of low-dose gabapentin for symptoms. She may benefit from physical therapy or physical modalities. Consideration of postural and gynecomastia type symptoms. Plan Sm evaluation to help proceed with definitive musculoskeletal intervention. At this time imaging not indicated. Defer  her follow-up appointment in July in regard to her statin cholesterol medication. -Patient advised to return or notify health care team  if symptoms worsen ,persist or new concerns arise.  Patient Instructions   Your pain could be a pinched nerve in your neck that's radiating down your shoulder arms. However it is still possible to be just muscle spasm from posture issues. Since ibuprofen hasn't helped to will try short-term gabapentin which does help nerve pain. We'll do sports medicine referral to help ascertain intervention.  physical therapy might help you with certain exercises.  In the short run can try off of the atorvastatin for a week to make sure that  is not a trigger for your symptoms. If   Better then restart and let us know   If pain returns       Standley Brooking. Maezie Justin M.D.

## 2016-05-24 ENCOUNTER — Encounter: Payer: Self-pay | Admitting: Internal Medicine

## 2016-05-24 ENCOUNTER — Ambulatory Visit (INDEPENDENT_AMBULATORY_CARE_PROVIDER_SITE_OTHER): Payer: 59 | Admitting: Internal Medicine

## 2016-05-24 VITALS — BP 130/80 | Temp 98.1°F | Wt 197.1 lb

## 2016-05-24 DIAGNOSIS — M25511 Pain in right shoulder: Secondary | ICD-10-CM

## 2016-05-24 DIAGNOSIS — M542 Cervicalgia: Secondary | ICD-10-CM

## 2016-05-24 DIAGNOSIS — I10 Essential (primary) hypertension: Secondary | ICD-10-CM

## 2016-05-24 DIAGNOSIS — E785 Hyperlipidemia, unspecified: Secondary | ICD-10-CM

## 2016-05-24 DIAGNOSIS — M25519 Pain in unspecified shoulder: Secondary | ICD-10-CM

## 2016-05-24 DIAGNOSIS — M25512 Pain in left shoulder: Secondary | ICD-10-CM

## 2016-05-24 MED ORDER — GABAPENTIN 100 MG PO CAPS: 100.0000 mg | ORAL_CAPSULE | Freq: Two times a day (BID) | ORAL | Status: DC

## 2016-05-24 NOTE — Patient Instructions (Addendum)
  Your pain could be a pinched nerve in your neck that's radiating down your shoulder arms. However it is still possible to be just muscle spasm from posture issues. Since ibuprofen hasn't helped to will try short-term gabapentin which does help nerve pain. We'll do sports medicine referral to help ascertain intervention.  physical therapy might help you with certain exercises.  In the short run can try off of the atorvastatin for a week to make sure that is not a trigger for your symptoms. If   Better then restart and let us know   If pain returns

## 2016-06-01 ENCOUNTER — Ambulatory Visit (INDEPENDENT_AMBULATORY_CARE_PROVIDER_SITE_OTHER): Payer: 59 | Admitting: Internal Medicine

## 2016-06-01 ENCOUNTER — Encounter: Payer: Self-pay | Admitting: Internal Medicine

## 2016-06-01 VITALS — BP 150/80 | Temp 98.1°F | Wt 195.3 lb

## 2016-06-01 DIAGNOSIS — G4486 Cervicogenic headache: Secondary | ICD-10-CM

## 2016-06-01 DIAGNOSIS — R51 Headache: Secondary | ICD-10-CM | POA: Diagnosis not present

## 2016-06-01 DIAGNOSIS — M542 Cervicalgia: Secondary | ICD-10-CM | POA: Diagnosis not present

## 2016-06-01 DIAGNOSIS — R519 Headache, unspecified: Secondary | ICD-10-CM

## 2016-06-01 MED ORDER — GABAPENTIN 100 MG PO CAPS: 100.0000 mg | ORAL_CAPSULE | Freq: Two times a day (BID) | ORAL | Status: DC

## 2016-06-01 NOTE — Patient Instructions (Signed)
soiunds like a nerve pain   In the head  ? If coming from your neck .  Increase the gabapentin to  200  Twice a day  And then 300 mg twice a day  And see if helps the pain .  Will be arranging a mri of your head and neck   In the interim .    Contact us if any fever rash  Keep appt  For  Sports medicine for now .

## 2016-06-01 NOTE — Progress Notes (Signed)
Pre visit review using our clinic review tool, if applicable. No additional management support is needed unless otherwise documented below in the visit note.  Chief Complaint  Patient presents with  . Headache    Denied nausea, vomiting, blurred vision or sharp pain behind the eyes    HPI: Teresa Hickman 45 y.o.  Fu ms pain and now headaches   Has hx of migraine and recnet upper neck and upper back pain seen last 2 weeks   Now in addition has  A new different type of headache .  Never had this before and  Hard to sleep  descibes as a burning constan from  back of neck tburrcnign to top of head    On the right side Doesn't seem like migrain e no vomiting   No fever  Has appt with Dr s next week.   Ibuprofen helps a little bit gabapentin 100 mg twice a day minimal help. No new weakness in her upper extremities radiation to her hands. ROS: See pertinent positives and negatives per HPI.  Past Medical History  Diagnosis Date  . Migraine     with some aura  . Hyperlipidemia   . Dysmenorrhea   . Essential hypertension 12/27/2014    Family History  Problem Relation Age of Onset  . Hypertension Mother   . Diabetes Mother   . Stroke Mother     82s [place]  75    Social History   Social History  . Marital Status: Married    Spouse Name: N/A  . Number of Children: N/A  . Years of Education: N/A   Social History Main Topics  . Smoking status: Never Smoker   . Smokeless tobacco: None  . Alcohol Use: No  . Drug Use: No  . Sexual Activity: No   Other Topics Concern  . None   Social History Narrative   From [place]   Medical Family   HH of 6    No pets   G4 p4     Outpatient Prescriptions Prior to Visit  Medication Sig Dispense Refill  . atorvastatin (LIPITOR) 20 MG tablet Take 1 tablet (20 mg total) by mouth daily. 90 tablet 1  . hydrochlorothiazide (HYDRODIURIL) 25 MG tablet Take 12.5 mg by mouth daily.    Marland Kitchen ibuprofen (ADVIL,MOTRIN) 600 MG tablet Take 600 mg by mouth  every 8 (eight) hours as needed for mild pain.    Marland Kitchen omeprazole (PRILOSEC) 20 MG capsule Take 1 capsule (20 mg total) by mouth daily. 30 capsule 0  . gabapentin (NEURONTIN) 100 MG capsule Take 1 capsule (100 mg total) by mouth 2 (two) times daily. Can increase to tid Or as directed 90 capsule 0   No facility-administered medications prior to visit.     EXAM:  BP 150/80 mmHg  Temp(Src) 98.1 F (36.7 C) (Oral)  Wt 195 lb 4.8 oz (88.587 kg)  Body mass index is 31.54 kg/(m^2).  GENERAL: vitals reviewed and listed above, alert, oriented, appears well hydrated and in mild  acute distress HEENT: atraumatic, conjunctiva  clear, no obvious abnormalities on inspection of external nose and ears tms nl  Points to lright occiput and to  Ant scalp no lesion no rash OP : no lesion edema or exudate  NECK: no obvious masses on inspection palpation  CV: HRRR, no clubbing cyanosis or  peripheral edema nl cap refill  MS: moves all extremities without noticeable focal  Abnormality says left knee still bothersome  PSYCH: pleasant and  cooperative,  BP Readings from Last 3 Encounters:  06/01/16 150/80  05/24/16 130/80  04/12/16 132/90   Wt Readings from Last 3 Encounters:  06/01/16 195 lb 4.8 oz (88.587 kg)  05/24/16 197 lb 1.6 oz (89.404 kg)  05/06/16 198 lb (89.812 kg)    ASSESSMENT AND PLAN:  Discussed the following assessment and plan:  New onset headache - right burnding   radiation from neck  - Plan: MR Brain Wo Contrast, MR Cervical Spine Wo Contrast  Neck pain - Plan: MR Brain Wo Contrast, MR Cervical Spine Wo Contrast  Cervicogenic headache ? - Plan: MR Brain Wo Contrast, MR Cervical Spine Wo Contrast New onset right-sided persistent head pain burning quality that is unrelenting and making her unable to sleep. It is minimally responsive to ibuprofen and she is on low-dose gabapentin given for her upper neck and back pain. Her neck is still bothering her. She does have a history of migraine  and she states this is not a migraine and she's never had this problem before. Fortunately no fever or other alarm symptoms. However because of the new onset atypical headache persistent quality plan imaging study of head and neck. Keep appointment with sports medicine regard to her neck and upper back pain. Monitor blood pressure on believe it's up today because of pain. -Patient advised to return or notify health care team  if symptoms worsen ,persist or new concerns arise.  Patient Instructions   soiunds like a nerve pain   In the head  ? If coming from your neck .  Increase the gabapentin to  200  Twice a day  And then 300 mg twice a day  And see if helps the pain .  Will be arranging a mri of your head and neck   In the interim .    Contact us if any fever rash  Keep appt  For  Sports medicine for now .      Standley Brooking. Panosh M.D.

## 2016-06-04 ENCOUNTER — Other Ambulatory Visit: Payer: 59

## 2016-06-09 ENCOUNTER — Ambulatory Visit
Admission: RE | Admit: 2016-06-09 | Discharge: 2016-06-09 | Disposition: A | Discharge status: Home / Self Care | Payer: 59 | Source: Ambulatory Visit | Attending: Internal Medicine | Admitting: Internal Medicine

## 2016-06-09 ENCOUNTER — Encounter: Payer: Self-pay | Admitting: Family Medicine

## 2016-06-09 ENCOUNTER — Ambulatory Visit (INDEPENDENT_AMBULATORY_CARE_PROVIDER_SITE_OTHER): Payer: 59 | Admitting: Family Medicine

## 2016-06-09 VITALS — BP 128/86 | HR 88 | Ht 66.0 in | Wt 192.0 lb

## 2016-06-09 DIAGNOSIS — R519 Headache, unspecified: Secondary | ICD-10-CM

## 2016-06-09 DIAGNOSIS — R51 Headache: Principal | ICD-10-CM

## 2016-06-09 DIAGNOSIS — M542 Cervicalgia: Secondary | ICD-10-CM

## 2016-06-09 DIAGNOSIS — M50222 Other cervical disc displacement at C5-C6 level: Secondary | ICD-10-CM | POA: Diagnosis not present

## 2016-06-09 DIAGNOSIS — G4486 Cervicogenic headache: Secondary | ICD-10-CM

## 2016-06-09 DIAGNOSIS — M6249 Contracture of muscle, multiple sites: Secondary | ICD-10-CM

## 2016-06-09 DIAGNOSIS — M62838 Other muscle spasm: Secondary | ICD-10-CM

## 2016-06-09 MED ORDER — TIZANIDINE HCL 4 MG PO TABS: 4.0000 mg | ORAL_TABLET | Freq: Every evening | ORAL | Status: DC

## 2016-06-09 NOTE — Assessment & Plan Note (Signed)
Patient is having with his peers to be more musculoskeletal pain. It appears the patient is scheduled for an MRI of the neck and as well as the head. I am not seeing any severe changes on physical exam today but does have significant tightness of the neck. Patient is given a muscle relaxer to see if this will be beneficial. Patient has been taking anti-inflammatories. Patient is also on a neuromodulator medication but has not notice any improvement at this time. We discussed postural and other changes that I think will be beneficial. Discussed the possibility of prednisone which patient declined. Because the worsening symptoms of the headache and do not feel that the MRI is a bad idea. We will try these other conservative measures and I would like to see patient back again in 3 weeks.

## 2016-06-09 NOTE — Progress Notes (Signed)
[name] [place], [place] [address] Phone: [phone] Subjective:    I'm seeing this patient by the request  of:  Lottie Dawson, MD   CC: Neck and shoulder pain  [id number] Teresa Hickman is a 45 y.o. female coming in with complaint of neck and shoulder pain. Patient is complaining more of the right-sided. Patient states this seems to start in the neck and radiates to her right shoulder as well as to her head. This is associated with a headache. States that once he goes away headache goes away as well. Denies any visual changes. Denies any shortness of breath. Patient states that it seems to be after she's been sitting at the computer for longer amount of time. Denies any radiation down the arm. Denies any weakness. States that it can be so severe that it does keep her up at night. Mild increase in stress recently. No new activities. Does not remember any true injury.     Past Medical History  Diagnosis Date  . Migraine     with some aura  . Hyperlipidemia   . Dysmenorrhea   . Essential hypertension 12/27/2014   Past Surgical History  Procedure Laterality Date  . No past surgeries     Social History   Social History  . Marital Status: Married    Spouse Name: N/A  . Number of Children: N/A  . Years of Education: N/A   Social History Main Topics  . Smoking status: Never Smoker   . Smokeless tobacco: None  . Alcohol Use: No  . Drug Use: No  . Sexual Activity: No   Other Topics Concern  . None   Social History Narrative   From [place]   Medical Family   HH of 6    No pets   G4 p4    No Known Allergies Family History  Problem Relation Age of Onset  . Hypertension Mother   . Diabetes Mother   . Stroke Mother     60s [place]  2010    Past medical history, social, surgical and family history all reviewed in electronic medical record.  No pertanent information unless stated regarding to the chief complaint.    Review of Systems: No headache, visual changes, nausea, vomiting, diarrhea, constipation, dizziness, abdominal pain, skin rash, fevers, chills, night sweats, weight loss, swollen lymph nodes, body aches, joint swelling, muscle aches, chest pain, shortness of breath, mood changes.   Objective Blood pressure 128/86, pulse 88, height 5\' 6"  (1.676 m), weight 192 lb (87.091 kg), SpO2 99 %.  General: No apparent distress alert and oriented x3 mood and affect normal, dressed appropriately.  HEENT: Pupils equal, extraocular movements intact  Respiratory: Patient's speak in full sentences and does not appear short of breath  Cardiovascular: No lower extremity edema, non tender, no erythema  Skin: Warm dry intact with no signs of infection or rash on extremities or on axial skeleton.  Abdomen: Soft nontender  Neuro: Cranial nerves II through XII are intact, neurovascularly intact in all extremities with 2+ DTRs and 2+ pulses.  Lymph: No lymphadenopathy of posterior or anterior cervical chain or axillae bilaterally.  Gait normal with good balance and coordination.  MSK:  Non tender with full range of motion and good stability and symmetric strength and tone of , elbows, wrist, hip, knee and ankles bilaterally.  Neck: Inspection unremarkable. No palpable stepoffs. Negative Spurling's maneuver. Mild limitation with left-sided side bending and left-sided rotation. Tenderness  to palpation on the right paraspinal musculature of the cervical spine. Grip strength and sensation normal in bilateral hands Strength good C4 to T1 distribution No sensory change to C4 to T1 Negative Hoffman sign bilaterally Reflexes normal  Shoulder: Right Inspection reveals no abnormalities, atrophy or asymmetry. Palpation is normal with no tenderness over AC joint or bicipital groove. ROM is full in all planes. Rotator cuff strength normal throughout. No signs of impingement with negative Neer and Hawkin's tests, empty  can sign. Speeds and Yergason's tests normal. No labral pathology noted with negative Obrien's, negative clunk and good stability. Normal scapular function observed. No painful arc and no drop arm sign. No apprehension sign Contralateral shoulder unremarkable       Impression and Recommendations:     This case required medical decision making of moderate complexity.      Note: This dictation was prepared with Dragon dictation along with smaller phrase technology. Any transcriptional errors that result from this process are unintentional.

## 2016-06-09 NOTE — Patient Instructions (Addendum)
Good to see you  Ice 20 minutes 2 times daily. Usually after activity and before bed. zanaflex at night as needed pennsaid pinkie amount topically 2 times daily as needed.  On wall with heels, butt shoulder and head touching for a goal of 5 minutes daily  2 tennis balls in a tube sock and lay on them where the neck meets the head. Wait for a release.  Monitor at eye level with the computer or even your tv See me again in 3 weeks to make sure better.  If worsening headaches we can get you in with neurology.

## 2016-06-09 NOTE — Progress Notes (Signed)
Pre visit review using our clinic review tool, if applicable. No additional management support is needed unless otherwise documented below in the visit note. 

## 2016-06-11 ENCOUNTER — Telehealth: Payer: Self-pay | Admitting: Internal Medicine

## 2016-06-11 ENCOUNTER — Ambulatory Visit: Payer: 59 | Admitting: Internal Medicine

## 2016-06-11 DIAGNOSIS — R51 Headache: Secondary | ICD-10-CM

## 2016-06-11 DIAGNOSIS — R519 Headache, unspecified: Secondary | ICD-10-CM

## 2016-06-11 DIAGNOSIS — Q048 Other specified congenital malformations of brain: Secondary | ICD-10-CM | POA: Insufficient documentation

## 2016-06-11 DIAGNOSIS — M62838 Other muscle spasm: Secondary | ICD-10-CM

## 2016-06-11 NOTE — Telephone Encounter (Signed)
   Please see result note  An anatomic variant on mri of head  Cerebellar tonsils  and mild disc protrusino on  c spine.  Uncertain if  Related to her sx   Would like a NS referral opinon to review her scans  And evaluate  As to cause of her pain .  her pain .  Dx  heada che neck pain and cerebellar tons ectopia ( all on  prob list) please put in referral if pt agrees.   Keep fu appts with dr Tamala Julian in the interim

## 2016-06-11 NOTE — Telephone Encounter (Signed)
Pt would like to know what her MRI results are.

## 2016-06-11 NOTE — Telephone Encounter (Signed)
I spoke with patient about MRI results. She states that her headache is still there. I will put in order for neurosurgery consult.

## 2016-06-14 ENCOUNTER — Ambulatory Visit (INDEPENDENT_AMBULATORY_CARE_PROVIDER_SITE_OTHER): Payer: 59 | Admitting: Diagnostic Neuroimaging

## 2016-06-14 ENCOUNTER — Encounter: Payer: Self-pay | Admitting: Diagnostic Neuroimaging

## 2016-06-14 VITALS — BP 153/87 | HR 99 | Ht 66.0 in | Wt 189.8 lb

## 2016-06-14 DIAGNOSIS — G43809 Other migraine, not intractable, without status migrainosus: Secondary | ICD-10-CM

## 2016-06-14 DIAGNOSIS — G4486 Cervicogenic headache: Secondary | ICD-10-CM

## 2016-06-14 DIAGNOSIS — R51 Headache: Secondary | ICD-10-CM

## 2016-06-14 DIAGNOSIS — M5481 Occipital neuralgia: Secondary | ICD-10-CM | POA: Diagnosis not present

## 2016-06-14 DIAGNOSIS — G43009 Migraine without aura, not intractable, without status migrainosus: Secondary | ICD-10-CM

## 2016-06-14 MED ORDER — PREDNISONE 10 MG PO TABS: ORAL_TABLET | ORAL | Status: DC

## 2016-06-14 MED ORDER — GABAPENTIN 100 MG PO CAPS: 100.0000 mg | ORAL_CAPSULE | Freq: Three times a day (TID) | ORAL | Status: DC

## 2016-06-14 NOTE — Patient Instructions (Signed)
Thank you for coming to see Teresa Hickman at [facility]. I hope we have been able to provide you high quality care today.  You may receive a patient satisfaction survey over the next few weeks. We would appreciate your feedback and comments so that we may continue to improve ourselves and the health of our patients.  - migraine infusion today - increase gabapentin to 240m three times per day; after 1 week increase to 3077mthree times per day - try prednisone tapering course (60, 50, 40, 30, 20, 10 then stop)   ~~~~~~~~~~~~~~~~~~~~~~~~~~~~~~~~~~~~~~~~~~~~~~~~~~~~~~~~~~~~~~~~~  DR. Joaquina Nissen'S GUIDE TO HAPPY AND HEALTHY LIVING These are some of my general health and wellness recommendations. Some of them may apply to you better than others. Please use common sense as you try these suggestions and feel free to ask me any questions.   ACTIVITY/FITNESS Mental, social, emotional and physical stimulation are very important for brain and body health. Try learning a new activity (arts, music, language, sports, games).  Keep moving your body to the best of your abilities. You can do this at home, inside or outside, the park, community center, gym or anywhere you like. Consider a physical therapist or personal trainer to get started. Consider the app Sworkit. Fitness trackers such as smart-watches, smart-phones or Fitbits can help as well.   NUTRITION Eat more plants: colorful vegetables, nuts, seeds and berries.  Eat less sugar, salt, preservatives and processed foods.  Avoid toxins such as cigarettes and alcohol.  Drink water when you are thirsty. Warm water with a slice of lemon is an excellent morning drink to start the day.  Consider these websites for more information The Nutrition Source (hthttps://www.henry-hernandez.biz/Precision Nutrition (wwWindowBlog.ch  RELAXATION Consider practicing mindfulness meditation or other relaxation  techniques such as deep breathing, prayer, yoga, tai chi, massage. See website mindful.org or the apps Headspace or Calm to help get started.   SLEEP Try to get at least 7-8+ hours sleep per day. Regular exercise and reduced caffeine will help you sleep better. Practice good sleep hygeine techniques. See website sleep.org for more information.   PLANNING Prepare estate planning, living will, healthcare POA documents. Sometimes this is best planned with the help of an attorney. Theconversationproject.org and agingwithdignity.org are excellent resources.

## 2016-06-14 NOTE — Progress Notes (Signed)
[facility]  PATIENT: Teresa Hickman DOB: 1971/07/11  REFERRING CLINICIAN: Dr. Jonelle Sidle (via Dr. Leonie Man) HISTORY FROM: patient and son REASON FOR VISIT: new consult    HISTORICAL  CHIEF COMPLAINT:  Chief Complaint  Patient presents with  . New Patient (Initial Visit)    RM 6, with Teresa Hickman (son). Headaches. Sx started about 3 weeks ago. Burning pain from neck to top of head.     HISTORY OF PRESENT ILLNESS:   45 year old female here for evaluation of headaches and neck pain. For past 3 weeks patient has new onset pain in the mid thoracic region, radiating to the neck and head. She describes right or left sided radiating pain in her scalp with burning sensation. No nausea or vomiting. No throbbing sensation. She has some photophobia. Patient also has aching pressure sensation. No recent infections, trauma, change in sleep or stress. No change in medications, physical activity or other triggering factors. Patient has been to PCP and sports medicine doctors and treated conservatively for musculoskeletal problems. Patient has been tried on gabapentin, tizanidine, ibuprofen without relief. Patient taking gabapentin 100 mg 3 times a day.  Patient has history of migraine headaches from age 43 years old. She describes right or left-sided pressure sensation associated with blurred vision, nausea, vomiting, photophobia. Triggering factors include sleep deprivation or stress. Patient was having 1-2 headaches per month but these stopped approximately 2 years ago. Patient previously used to take ibuprofen, lay down in a dark quiet room and go to sleep.  Patient drinks 1 cup of coffee per day. Patient goes to sleep at 10 PM and wakes up at 6 AM. After dropping her child off at school she returns home in an takes a nap from 8 AM to 11 AM. She denies any other excessive daytime sleepiness or snoring. She has never had a sleep study.    REVIEW OF SYSTEMS: Full 14 system review of systems  performed and negative with exception of: decr activity fatigue ear pain joint pain back pain aching muscles walking difficulty neck pain neck stiffness headache weakness.   ALLERGIES: No Known Allergies  HOME MEDICATIONS: Outpatient Prescriptions Prior to Visit  Medication Sig Dispense Refill  . atorvastatin (LIPITOR) 20 MG tablet Take 1 tablet (20 mg total) by mouth daily. 90 tablet 1  . gabapentin (NEURONTIN) 100 MG capsule Take 1 capsule (100 mg total) by mouth 2 (two) times daily. Increase to 200 - 300 mg bid 90 capsule 0  . hydrochlorothiazide (HYDRODIURIL) 25 MG tablet Take 12.5 mg by mouth daily.    Marland Kitchen ibuprofen (ADVIL,MOTRIN) 600 MG tablet Take 600 mg by mouth every 8 (eight) hours as needed for mild pain.    Marland Kitchen omeprazole (PRILOSEC) 20 MG capsule Take 1 capsule (20 mg total) by mouth daily. 30 capsule 0  . tiZANidine (ZANAFLEX) 4 MG tablet Take 1 tablet (4 mg total) by mouth Nightly. 30 tablet 2   No facility-administered medications prior to visit.    PAST MEDICAL HISTORY: Past Medical History  Diagnosis Date  . Migraine     with some aura  . Hyperlipidemia   . Dysmenorrhea   . Essential hypertension 12/27/2014    PAST SURGICAL HISTORY: Past Surgical History  Procedure Laterality Date  . No past surgeries      FAMILY HISTORY: Family History  Problem Relation Age of Onset  . Hypertension Mother   . Diabetes Mother   . Stroke Mother     63s [place]  2010  SOCIAL HISTORY:  Social History   Social History  . Marital Status: Married    Spouse Name: Lawal  . Number of Children: 4  . Years of Education: 16   Occupational History  . Unemployed    Social History Main Topics  . Smoking status: Never Smoker   . Smokeless tobacco: Not on file  . Alcohol Use: No  . Drug Use: No  . Sexual Activity: No   Other Topics Concern  . Not on file   Social History Narrative   Lives with husband and son, Teresa Hickman   From [place]   Medical Family   HH of 6    No pets    G4 p4    Caffeine use: 1 cup coffee per day     PHYSICAL EXAM  GENERAL EXAM/CONSTITUTIONAL: Vitals:  Filed Vitals:   06/14/16 1538  BP: 153/87  Pulse: 99  Height: 5\' 6"  (1.676 m)  Weight: 189 lb 12.8 oz (86.093 kg)     Body mass index is 30.65 kg/(m^2).  Visual Acuity Screening   Right eye Left eye Both eyes  Without correction: 20/20 20/20 20/20   With correction:        Patient is in no distress; well developed, nourished and groomed; neck is supple  CARDIOVASCULAR:  Examination of carotid arteries is normal; no carotid bruits  Regular rate and rhythm, no murmurs  Examination of peripheral vascular system by observation and palpation is normal  EYES:  Ophthalmoscopic exam of optic discs and posterior segments is normal; no papilledema or hemorrhages  PHOTOSENSITIVE  MUSCULOSKELETAL:  Gait, strength, tone, movements noted in Neurologic exam below  NEUROLOGIC: MENTAL STATUS:  No flowsheet data found.  awake, alert, oriented to person, place and time  recent and remote memory intact  normal attention and concentration  language fluent, comprehension intact, naming intact,   fund of knowledge appropriate  CRANIAL NERVE:   2nd - no papilledema on fundoscopic exam  2nd, 3rd, 4th, 6th - pupils equal and reactive to light, visual fields full to confrontation, extraocular muscles intact, no nystagmus  5th - facial sensation symmetric  7th - facial strength symmetric  8th - hearing intact  9th - palate elevates symmetrically, uvula midline  11th - shoulder shrug symmetric  12th - tongue protrusion midline  MOTOR:   normal bulk and tone, full strength in the BUE, BLE  SENSORY:   normal and symmetric to light touch, temperature, vibration  COORDINATION:   finger-nose-finger, fine finger movements normal  REFLEXES:   deep tendon reflexes TRACE and symmetric  GAIT/STATION:   narrow based gait    DIAGNOSTIC DATA (LABS, IMAGING,  TESTING) - I reviewed patient records, labs, notes, testing and imaging myself where available.  Lab Results  Component Value Date   WBC 7.1 09/03/2015   HGB 12.4 09/03/2015   HCT 39.1 09/03/2015   MCV 86.7 09/03/2015   PLT 330.0 09/03/2015      Component Value Date/Time   NA 138 03/02/2016 1125   K 3.5 03/02/2016 1125   CL 99 03/02/2016 1125   CO2 30 03/02/2016 1125   GLUCOSE 86 03/02/2016 1125   BUN 10 03/02/2016 1125   CREATININE 0.57 03/02/2016 1125   CALCIUM 9.4 03/02/2016 1125   PROT 7.2 09/03/2015 1003   ALBUMIN 3.6 09/03/2015 1003   AST 12 09/03/2015 1003   ALT 13 09/03/2015 1003   ALKPHOS 66 09/03/2015 1003   BILITOT 0.4 09/03/2015 1003   GFRNONAA >60 04/14/2015 1831  GFRAA >60 04/14/2015 1831   Lab Results  Component Value Date   CHOL 261* 03/02/2016   HDL 48.90 03/02/2016   LDLCALC 198* 03/02/2016   LDLDIRECT 176.4 10/09/2008   TRIG 70.0 03/02/2016   CHOLHDL 5 03/02/2016   No results found for: HGBA1C No results found for: VITAMINB12 Lab Results  Component Value Date   TSH 2.31 09/03/2015   05/06/16 myocardial perfusion stress test  Nuclear stress EF: 72%.  There was no ST segment deviation noted during stress.  The study is normal. no evidence of ischemia.  This is a low risk study.  The left ventricular ejection fraction is hyperdynamic (>65%).  06/09/16 MRI brain [I reviewed images myself and agree with interpretation. 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 This is likely within normal limits for age. - otherwise normal MRI appearance of the brain.  06/09/16 MRI cervical spine [I reviewed images myself and agree with interpretation. -VRP]  - shallow central disc protrusion at C5-6 without significant stenosis. - no other significant cervical disc disease or stenosis.    ASSESSMENT AND PLAN  45 y.o. year  old female here with history of migraine headaches with aura since age 69 years old, now with new type of headache and neck pain since past 3 weeks. Could represent cervicogenic headache, migraine variant or occipital neuralgia.   Ddx: cervicogenic headache, migraine variant, occipital neuralgia  1. Cervicogenic headache   2. Atypical migraine   3. Bilateral occipital neuralgia      PLAN: - migraine infusion today - increase gabapentin to 200mg  three times per day; after 1 week increase to 300mg  three times per day - try prednisone tapering course (60, 50, 40, 30, 20, 10 then stop) - monitor BP (Blood pressure 153/87, pulse 99 at visit today) - consider sleep study in future (risk of sleep apnea)  Meds ordered this encounter  Medications  . gabapentin (NEURONTIN) 100 MG capsule    Sig: Take 1-3 capsules (100-300 mg total) by mouth 3 (three) times daily.    Dispense:  270 capsule    Refill:  6  . predniSONE (DELTASONE) 10 MG tablet    Sig: Take 60mg  on day 1. Reduce by 10mg  each subsequent day. (60, 50, 40, 30, 20, 10, stop)    Dispense:  21 tablet    Refill:  0   Return in about 1 month (around 07/15/2016).  I reviewed images, labs, notes, records myself. I summarized findings and reviewed with patient, for this moderate risk condition (new onset headache; new onset neck pain) requiring high complexity decision making.     Penni Bombard, MD [id number], [id number] PM Certified in Neurology, Neurophysiology and Neuroimaging  [facility] [address], [place], [place] [address] [phone]

## 2016-06-21 ENCOUNTER — Ambulatory Visit (INDEPENDENT_AMBULATORY_CARE_PROVIDER_SITE_OTHER): Payer: 59 | Admitting: Neurology

## 2016-06-21 ENCOUNTER — Encounter: Payer: Self-pay | Admitting: Diagnostic Neuroimaging

## 2016-06-21 ENCOUNTER — Telehealth: Payer: Self-pay | Admitting: Diagnostic Neuroimaging

## 2016-06-21 ENCOUNTER — Encounter: Payer: Self-pay | Admitting: Neurology

## 2016-06-21 ENCOUNTER — Telehealth: Payer: Self-pay | Admitting: Internal Medicine

## 2016-06-21 VITALS — BP 142/88 | HR 72 | Resp 14 | Ht 66.0 in | Wt 190.0 lb

## 2016-06-21 DIAGNOSIS — M6249 Contracture of muscle, multiple sites: Secondary | ICD-10-CM | POA: Diagnosis not present

## 2016-06-21 DIAGNOSIS — G43009 Migraine without aura, not intractable, without status migrainosus: Secondary | ICD-10-CM | POA: Insufficient documentation

## 2016-06-21 DIAGNOSIS — M542 Cervicalgia: Secondary | ICD-10-CM | POA: Insufficient documentation

## 2016-06-21 DIAGNOSIS — M62838 Other muscle spasm: Secondary | ICD-10-CM

## 2016-06-21 NOTE — Telephone Encounter (Signed)
Pts spouse called in re: pt continued HA; esp occipital neuralgia type symptoms. Pt would like to come in for occ nerve block. I discussed with Dr. Felecia Shelling who kindly agreed to perform procedure later today.  Penni Bombard, MD [id number], [id number] AM Certified in Neurology, Neurophysiology and Neuroimaging  [facility] [address], [place], [place] [address] [phone]

## 2016-06-21 NOTE — Progress Notes (Signed)
[facility]  PATIENT: Teresa Hickman DOB: [birth year]  REFERRING CLINICIAN: Dr. Jonelle Sidle (via Dr. Leonie Man) HISTORY FROM: patient and son REASON FOR VISIT: new consult    HISTORICAL  CHIEF COMPLAINT:  Chief Complaint  Patient presents with  . Headache    Pt. of Dr. Gladstone Lighter here for an ONB/fim    HISTORY OF PRESENT ILLNESS:   Teresa Hickman is a 45 year old female Who has had left greater than right sided headache and that pain for the past month.    At times, pain radiates from the back of the head forward she denies any nausea, vomiting, photophobia or phonophobia. Moving does not greatly change the headache.   Ice helps.   No recent infections, trauma, change in sleep or stress. No change in medications, physical activity or other triggering factors. MRI of the cervical spine just showed mild degenerative changes at C5-C6. MRI of the brain was essentially normal with just 1 T2/FLAIR hyperintense focus.   The scans were personally reviewed today.  She saw Dr. Leta Baptist last week who increased her gabapentin to 300 mg by mouth 3 times a day. She was felt to have a cervicogenic headache. Pain has persisted.     She has history of migraine headaches from age 39 years old. She was having 1-2 headaches per month but these stopped approximately 2 years ago. Patient previously used to take ibuprofen, lay down in a dark quiet room and go to sleep.  REVIEW OF SYSTEMS: Full 14 system review of systems performed and negative with exception of: decr activity fatigue ear pain joint pain back pain aching muscles walking difficulty neck pain neck stiffness headache weakness.   ALLERGIES: No Known Allergies  HOME MEDICATIONS: Outpatient Prescriptions Prior to Visit  Medication Sig Dispense Refill  . atorvastatin (LIPITOR) 20 MG tablet Take 1 tablet (20 mg total) by mouth daily. 90 tablet 1  . gabapentin (NEURONTIN) 100 MG capsule Take 1-3 capsules (100-300 mg total) by mouth 3  (three) times daily. 270 capsule 6  . hydrochlorothiazide (HYDRODIURIL) 25 MG tablet Take 12.5 mg by mouth daily.    Marland Kitchen ibuprofen (ADVIL,MOTRIN) 600 MG tablet Take 600 mg by mouth every 8 (eight) hours as needed for mild pain.    Marland Kitchen omeprazole (PRILOSEC) 20 MG capsule Take 1 capsule (20 mg total) by mouth daily. 30 capsule 0  . predniSONE (DELTASONE) 10 MG tablet Take 60mg  on day 1. Reduce by 10mg  each subsequent day. (60, 50, 40, 30, 20, 10, stop) 21 tablet 0  . tiZANidine (ZANAFLEX) 4 MG tablet Take 1 tablet (4 mg total) by mouth Nightly. 30 tablet 2   No facility-administered medications prior to visit.    PAST MEDICAL HISTORY: Past Medical History  Diagnosis Date  . Migraine     with some aura  . Hyperlipidemia   . Dysmenorrhea   . Essential hypertension 12/27/2014    PAST SURGICAL HISTORY: Past Surgical History  Procedure Laterality Date  . No past surgeries      FAMILY HISTORY: Family History  Problem Relation Age of Onset  . Hypertension Mother   . Diabetes Mother   . Stroke Mother     26s SD  2010    SOCIAL HISTORY:  Social History   Social History  . Marital Status: Married    Spouse Name: Lawal  . Number of Children: 4  . Years of Education: 16   Occupational History  . Unemployed    Social History Main Topics  .  Smoking status: Never Smoker   . Smokeless tobacco: Not on file  . Alcohol Use: No  . Drug Use: No  . Sexual Activity: No   Other Topics Concern  . Not on file   Social History Narrative   Lives with husband and son, [name]   From [place]   Medical Family   HH of 6    No pets   G4 p4    Caffeine use: 1 cup coffee per day     PHYSICAL EXAM  GENERAL EXAM/CONSTITUTIONAL: Vitals:  Filed Vitals:   06/21/16 1427  BP: 142/88  Pulse: 72  Resp: 14  Height: 5\' 6"  (1.676 m)  Weight: 190 lb (86.183 kg)   Body mass index is 30.68 kg/(m^2). No exam data present  Patient is in no distress; well developed, nourished and groomed; neck  is supple   Neck is tender over the left splenius capitis/occipital nerve. Range of motion is normal.   EYES:  Ophthalmoscopic exam of optic discs and posterior segments is normal; no papilledema or hemorrhages    NEUROLOGIC: MENTAL STATUS:    awake, alert, oriented to person, place and time  recent and remote memory intact  normal attention and concentration  language fluent, comprehension intact, naming intact,   fund of knowledge appropriate  CRANIAL NERVE:   2nd - no papilledema on fundoscopic exam  2nd, 3rd, 4th, 6th - pupils equal and reactive to light, visual fields full to confrontation, extraocular muscles intact, no nystagmus  5th - facial sensation symmetric  7th - facial strength symmetric  8th - hearing intact  9th - palate elevates symmetrically, uvula midline  11th - shoulder shrug symmetric  12th - tongue protrusion midline  MOTOR:   normal bulk and tone, full strength in the BUE, BLE    REFLEXES:   deep tendon reflexes TRACE and symmetric  GAIT/STATION:   narrow based gait     ASSESSMENT AND PLAN    1. Neck pain   2. Migraine without aura and without status migrainosus, not intractable   3. Cervical paraspinal muscle spasm      PLAN: - Bilateral splenius capitis trigger point injections with 80 mg Depo-Medrol and Marcaine. She tolerated the procedure well. This should also reach the occipital nerves. She reported decreased pain within several minutes. - continue gabapentin 300mg  three times per day  Richard A. Felecia Shelling, MD, PhD Certified in Neurology, [place] [facility], Sleep Medicine, Pain Medicine and Neuroimaging  [facility] [address] [place], [place] [address] [phone]

## 2016-06-21 NOTE — Telephone Encounter (Signed)
Pt need new Rx for hydrochlorothiazide   Pharm:  [facility]

## 2016-06-21 NOTE — Telephone Encounter (Signed)
I have spoken with Teresa Hickman this morning and given appt. with RAS this afternoon at 2pm/fim

## 2016-06-22 MED ORDER — HYDROCHLOROTHIAZIDE 25 MG PO TABS: 12.5000 mg | ORAL_TABLET | Freq: Every day | ORAL | Status: DC

## 2016-06-22 NOTE — Telephone Encounter (Signed)
Sent to the pharmacy by e-scribe. 

## 2016-06-24 ENCOUNTER — Other Ambulatory Visit: Payer: 59

## 2016-06-25 ENCOUNTER — Other Ambulatory Visit (INDEPENDENT_AMBULATORY_CARE_PROVIDER_SITE_OTHER): Payer: 59

## 2016-06-25 DIAGNOSIS — E785 Hyperlipidemia, unspecified: Secondary | ICD-10-CM | POA: Diagnosis not present

## 2016-06-25 LAB — LIPID PANEL
CHOL/HDL RATIO: 4
Cholesterol: 218 mg/dL — ABNORMAL HIGH (ref 0–200)
HDL: 51.3 mg/dL (ref >39.00)
LDL CALC: 152 mg/dL — AB (ref 0–99)
NONHDL: 166.8
Triglycerides: 75 mg/dL (ref 0.0–149.0)
VLDL: 15 mg/dL (ref 0.0–40.0)

## 2016-06-28 ENCOUNTER — Telehealth: Payer: Self-pay | Admitting: Internal Medicine

## 2016-06-28 DIAGNOSIS — G500 Trigeminal neuralgia: Secondary | ICD-10-CM | POA: Diagnosis not present

## 2016-06-28 NOTE — Telephone Encounter (Signed)
Followed-up again on Panosh schedule and no cancellations today.

## 2016-06-28 NOTE — Telephone Encounter (Signed)
Noted  

## 2016-06-28 NOTE — Telephone Encounter (Signed)
Patient Name: Teresa Hickman  DOB: February 23, 1971    Initial Comment Caller says mother has severe inflammation in front of her Lt ear,. It also is very red, and numb. she has been having burning sensation headaches, which com from her neck, and has upper shoulder pains. She has an appt for tomorrow, and wants to move that to today.    Nurse Assessment  Nurse: Raphael Gibney, RN, Vanita Ingles Date/Time (Eastern Time): 06/28/2016 12:33:03 PM  Confirm and document reason for call. If symptomatic, describe symptoms. You must click the next button to save text entered. ---Caller states mother has inflammation in front of left ear. Area is numb and red. No fever. She is having neck pain which radiates to her headache. Has burning sensation in her head. Has appt tomorrow.  Has the patient traveled out of the country within the last 30 days? ---Not Applicable  Does the patient have any new or worsening symptoms? ---Yes  Will a triage be completed? ---Yes  Related visit to physician within the last 2 weeks? ---No  Does the PT have any chronic conditions? (i.e. diabetes, asthma, etc.) ---No  Is the patient pregnant or possibly pregnant? (Ask all females between the ages of 25-55) ---No  Is this a behavioral health or substance abuse call? ---No     Guidelines    Guideline Title Affirmed Question Affirmed Notes  Skin Lump or Localized Swelling [1] Swelling is painful to touch AND [2] no fever    Final Disposition User   See Physician within 24 Hours Stringer, RN, Vera    Comments  charge nurse says to use numbness one side  Caller states that pt already has appt for tomorrow but would like one today. no appts available today at [facility]. Pt would like to be called if Dr. Regis Bill has a cancellation.   Referrals  REFERRED TO PCP OFFICE   Disagree/Comply: Comply

## 2016-06-29 ENCOUNTER — Encounter: Payer: Self-pay | Admitting: Internal Medicine

## 2016-06-29 ENCOUNTER — Ambulatory Visit (INDEPENDENT_AMBULATORY_CARE_PROVIDER_SITE_OTHER): Payer: 59 | Admitting: Internal Medicine

## 2016-06-29 VITALS — BP 138/84 | Temp 98.3°F | Wt 190.3 lb

## 2016-06-29 DIAGNOSIS — R6884 Jaw pain: Secondary | ICD-10-CM | POA: Diagnosis not present

## 2016-06-29 DIAGNOSIS — E785 Hyperlipidemia, unspecified: Secondary | ICD-10-CM

## 2016-06-29 DIAGNOSIS — R519 Headache, unspecified: Secondary | ICD-10-CM

## 2016-06-29 DIAGNOSIS — R51 Headache: Secondary | ICD-10-CM | POA: Diagnosis not present

## 2016-06-29 DIAGNOSIS — G4486 Cervicogenic headache: Secondary | ICD-10-CM

## 2016-06-29 NOTE — Patient Instructions (Addendum)
Can go back on the atorvastatin as this doesn't seem to  Effect your pain . Your bp is at goal today . Left tmj joint  .Marland Kitchen   Relative rest for now .   I will send message to neurology since  Gabapentin occipital block steroids and not helping your pain .  Other ideas in the interim .  ? If meds like tegretol  Could help like it dosing   Trigeminal neuralgia  Keep appt with dr Saintclair Halsted.  As planned .  Let us know if we need to do a rheumatology referral .

## 2016-06-29 NOTE — Progress Notes (Signed)
Pre visit review using our clinic review tool, if applicable. No additional management support is needed unless otherwise documented below in the visit note.  Chief Complaint  Patient presents with  . Follow-up    HPI: Teresa Hickman 45 y.o.  Here with family member son today   Fu lipid management  Patient tried off of the Lipitor and did not notice any difference in her musculoskeletal or headache pain. She has been off of it about 3 weeks or so when had the blood test done.  Neck cervicogenic type HA  And evaluation from neuro  See notes she has been treated with anti-inflammatories prednisone oral prednisone injection and occipital nerve block without success of her pain that she describes as a burning pain starting in the back of her head neck transitioning up to her head. Denies any specific weakness in her arms at this time. She has a follow-up appointment in neurology in a few weeks and an appointment with Dr. Saintclair Halsted this week in regard to her MRI and her symptoms.  There was some relief with a migraine cocktail.  Sometimes takes a muscle relaxant at night but her headaches are worse in the morning.  She is taking up to gabapentin 300 mg 3 times a day without significant relief.  She has some tenderness pain and popping in her left TMJ area without trauma or fever.  Neurology consider sending her to rheumatology because her sedimentation rate was in the 40s. ROS: See pertinent positives and negatives per HPI.  Past Medical History  Diagnosis Date  . Migraine     with some aura  . Hyperlipidemia   . Dysmenorrhea   . Essential hypertension 12/27/2014    Family History  Problem Relation Age of Onset  . Hypertension Mother   . Diabetes Mother   . Stroke Mother     76s SD  19    Social History   Social History  . Marital Status: Married    Spouse Name: Lawal  . Number of Children: 4  . Years of Education: 16   Occupational History  . Unemployed    Social  History Main Topics  . Smoking status: Never Smoker   . Smokeless tobacco: None  . Alcohol Use: No  . Drug Use: No  . Sexual Activity: No   Other Topics Concern  . None   Social History Narrative   Lives with husband and son, [name]   From [place]   Medical Family   HH of 6    No pets   G4 p4    Caffeine use: 1 cup coffee per day    Outpatient Prescriptions Prior to Visit  Medication Sig Dispense Refill  . gabapentin (NEURONTIN) 100 MG capsule Take 1-3 capsules (100-300 mg total) by mouth 3 (three) times daily. 270 capsule 6  . hydrochlorothiazide (HYDRODIURIL) 25 MG tablet Take 0.5 tablets (12.5 mg total) by mouth daily. 45 tablet 1  . ibuprofen (ADVIL,MOTRIN) 600 MG tablet Take 600 mg by mouth every 8 (eight) hours as needed for mild pain.    Marland Kitchen omeprazole (PRILOSEC) 20 MG capsule Take 1 capsule (20 mg total) by mouth daily. 30 capsule 0  . tiZANidine (ZANAFLEX) 4 MG tablet Take 1 tablet (4 mg total) by mouth Nightly. 30 tablet 2  . atorvastatin (LIPITOR) 20 MG tablet Take 1 tablet (20 mg total) by mouth daily. (Patient not taking: Reported on 06/29/2016) 90 tablet 1  . predniSONE (DELTASONE) 10 MG tablet Take 60mg   on day 1. Reduce by 10mg  each subsequent day. (60, 50, 40, 30, 20, 10, stop) 21 tablet 0   No facility-administered medications prior to visit.     EXAM:  BP 138/84 mmHg  Temp(Src) 98.3 F (36.8 C) (Oral)  Wt 190 lb 4.8 oz (86.32 kg)  LMP 06/21/2016  Body mass index is 30.73 kg/(m^2).  GENERAL: vitals reviewed and listed above, alert, oriented, appears well hydrated and in no acute distress HEENT: atraumatic, conjunctiva  clear, no obvious abnormalities on inspection of external nose and ears OP : no lesion edema or exudate Left TMJ mild click mild erythema anterior to ear EACs normal TM normal. Skin shows mild abrasion but no pus or streaking. NECK: no obvious masses on inspection palpation  MS: moves all extremities without noticeable focal   abnormality PSYCH: pleasant and cooperative,   BP Readings from Last 3 Encounters:  06/29/16 138/84  06/21/16 142/88  06/14/16 153/87   Wt Readings from Last 3 Encounters:  06/29/16 190 lb 4.8 oz (86.32 kg)  06/21/16 190 lb (86.183 kg)  06/14/16 189 lb 12.8 oz (86.093 kg)   Lab Results  Component Value Date   WBC 7.1 09/03/2015   HGB 12.4 09/03/2015   HCT 39.1 09/03/2015   PLT 330.0 09/03/2015   GLUCOSE 86 03/02/2016   CHOL 218* 06/25/2016   TRIG 75.0 06/25/2016   HDL 51.30 06/25/2016   LDLDIRECT 176.4 10/09/2008   LDLCALC 152* 06/25/2016   ALT 13 09/03/2015   AST 12 09/03/2015   NA 138 03/02/2016   K 3.5 03/02/2016   CL 99 03/02/2016   CREATININE 0.57 03/02/2016   BUN 10 03/02/2016   CO2 30 03/02/2016   TSH 2.31 09/03/2015    ASSESSMENT AND PLAN:  Discussed the following assessment and plan:  Hyperlipidemia - No apparent side effects or change when she went off of the atorvastatin some improvement but not maximum go back on atorvastatin will follow up with her lipids  Cervicogenic headache ?  New onset headache  Jaw pain - prob tmj related    Her headache or head pain is most problematic and although she can sleep states it's worse in the morning and his ongoing burning quality that begins in the back of her neck.  can try inc to 600 gaba at night  ? Other options  Fu .  We'll ask neurology to consider whether they think a medication such as Tegretol might help her like it helps people with trigeminal neuralgia. Her left jaw pain seems to be related to TMJ there some mild erythema but no other swelling or tenderness. I'm thinking that the redness is from her manipulation. She is to small scratching the area. -Patient advised to return or notify health care team  if symptoms worsen ,persist or new concerns arise.  Patient Instructions  Can go back on the atorvastatin as this doesn't seem to  Effect your pain . Your bp is at goal today . Left tmj joint  .Marland Kitchen    Relative rest for now .   I will send message to neurology since  Gabapentin occipital block steroids and not helping your pain .  Other ideas in the interim .  ? If meds like tegretol  Could help like it dosing   Trigeminal neuralgia  Keep appt with dr Saintclair Halsted.  As planned .  Let us know if we need to do a rheumatology referral .      Standley Brooking. Panosh M.D.

## 2016-07-02 DIAGNOSIS — Z6833 Body mass index (BMI) 33.0-33.9, adult: Secondary | ICD-10-CM | POA: Diagnosis not present

## 2016-07-02 DIAGNOSIS — M542 Cervicalgia: Secondary | ICD-10-CM | POA: Diagnosis not present

## 2016-07-08 ENCOUNTER — Telehealth: Payer: Self-pay | Admitting: *Deleted

## 2016-07-08 NOTE — Telephone Encounter (Signed)
Per Dr Leta Baptist, called patient to move her follow up closer due to continued pain with trigeminal neuralgia. Scheduled her for next Tues; patient verbalized understanding, appreciation.

## 2016-07-13 ENCOUNTER — Ambulatory Visit: Payer: 59 | Admitting: Physical Therapy

## 2016-07-13 ENCOUNTER — Ambulatory Visit: Payer: 59 | Attending: General Practice

## 2016-07-13 DIAGNOSIS — M542 Cervicalgia: Secondary | ICD-10-CM | POA: Insufficient documentation

## 2016-07-13 DIAGNOSIS — R252 Cramp and spasm: Secondary | ICD-10-CM | POA: Insufficient documentation

## 2016-07-13 DIAGNOSIS — G44209 Tension-type headache, unspecified, not intractable: Secondary | ICD-10-CM | POA: Insufficient documentation

## 2016-07-13 DIAGNOSIS — R293 Abnormal posture: Secondary | ICD-10-CM | POA: Diagnosis not present

## 2016-07-13 NOTE — Patient Instructions (Signed)
From cabinet issued info on need for good posture and how to assume this position as mach as able at home. Issue info on dry needling

## 2016-07-13 NOTE — Therapy (Signed)
Teresa [place], [place], Hickman Phone: [phone]   Fax:  [phone]  Physical Therapy Treatment  Patient Details  Name: Teresa Hickman MRN: [id number] Date of Birth: Jun 10, 1971 Referring Provider: Kary Kos , MD  Encounter Date: 07/13/2016      PT End of Session - 07/13/16 1523    Visit Number 1   Number of Visits 8   Date for PT Re-Evaluation 08/13/16   Authorization Type UMR    PT Start Time 0302   PT Stop Time 0340   PT Time Calculation (min) 38 min   Activity Tolerance Patient tolerated treatment well;No increased pain   Behavior During Therapy WFL for tasks assessed/performed      Past Medical History:  Diagnosis Date  . Dysmenorrhea   . Essential hypertension 12/27/2014  . Hyperlipidemia   . Migraine    with some aura    Past Surgical History:  Procedure Laterality Date  . NO PAST SURGERIES      There were no vitals filed for this visit.      Subjective Assessment - 07/13/16 1508    Subjective She reports onset of headaches RT side 5 weeks ago. She woke with neck pain and egan to have headaches  Head aches equal now RT and LT from occiput to upper back. She is taking medication from MD. Injection into neck did not help.Marland Kitchen   Pertinent History migraines   Limitations --  Doing all normal activity now but with pain   How long can you sit comfortably? As needed   How long can you stand comfortably? As needed   How long can you walk comfortably? As needed   Diagnostic tests MRI See in EPIC   Patient Stated Goals Not xure   Currently in Pain? Yes   Pain Score 3    Pain Location Neck  apex of head   Pain Orientation Right;Left   Pain Descriptors / Indicators Burning  pinching   Pain Type --  sub acute   Pain Onset More than a month ago   Pain Frequency Constant   Aggravating Factors  AM worse, nothing   Pain Relieving Factors Ice , massage from children   Multiple Pain Sites No             [facility] PT Assessment - 07/13/16 1506      Assessment   Medical Diagnosis headaches   Referring Provider Kary Kos , MD   Onset Date/Surgical Date --  5 weeks,  She has had migraines in past   Next MD Visit Will see end of moth Dr Saintclair Halsted , Neurologist 07/14/16   Prior Therapy No      Precautions   Precautions None     Restrictions   Weight Bearing Restrictions No     Balance Screen   Has the patient fallen in the past 6 months No   Has the patient had a decrease in activity level because of a fear of falling?  No   Is the patient reluctant to leave their home because of a fear of falling?  No     Prior Function   Level of Independence Independent     Cognition   Overall Cognitive Status Within Functional Limits for tasks assessed     Coordination   Gross Motor Movements are Fluid and Coordinated Yes     Posture/Postural Control   Posture Comments sits flexed with forward head and rounded shouldeers  ROM / Strength   AROM / PROM / Strength AROM;Strength     AROM   Overall AROM Comments decreased cervical retraction and thoracic extension     AROM Assessment Site Cervical   Cervical Flexion 52   Cervical Extension 60   Cervical - Right Side Bend 45   Cervical - Left Side Bend 45   Cervical - Right Rotation 78   Cervical - Left Rotation 78     Strength   Overall Strength Comments Normal     Flexibility   Soft Tissue Assessment /Muscle Length yes     Palpation   Palpation comment Tender most in upper cervical soft tissue bilaterall nand less so at cervico thoracic junction     Ambulation/Gait   Gait Comments WNL                             PT Education - 07/13/16 1538    Education provided Yes   Education Details POC , posture, dry needling   Person(s) Educated Patient   Methods Explanation;Demonstration;Tactile cues;Verbal cues;Handout   Comprehension Returned demonstration;Verbalized understanding              PT Long Term Goals - 07/13/16 1542      PT LONG TERM GOAL #1   Title She will deom understanding of all exercises issued    Time 4   Period Weeks   Status New     PT LONG TERM GOAL #2   Title She will improve posture awareness  and demo awareness fo this   Time 4   Period Weeks   Status New     PT LONG TERM GOAL #3   Title She will report headaches as intermittant   Time 4   Period Weeks   Status New     PT LONG TERM GOAL #4   Title She will report neck pain as intermittant   Time 4   Period Weeks   Status New     PT LONG TERM GOAL #5   Title She will report pain intensity as 75% less   Time 4   Period Weeks   Status New               Plan - 07/13/16 1539    Clinical Impression Statement Teresa Hickman presents with low level complexity eval with upper back and nec pain with headaches , abnormal posture muscle spasms making normal activity more difficult. She shoudl benefit from PT but needs to work on posture /flexibility to main tain gains   Rehab Potential Good   PT Frequency 2x / week   PT Duration 4 weeks   PT Treatment/Interventions Cryotherapy;Electrical Stimulation;Moist Heat;Traction;Ultrasound;Passive range of motion;Patient/family education;Manual techniques;Taping;Dry needling;Therapeutic exercise   PT Next Visit Plan Manual for spassm nad retraction ROM of neck , modalities as needed for pain , posture review   PT Home Exercise Plan posture awareness   Consulted and Agree with Plan of Care Patient      Patient will benefit from skilled therapeutic intervention in order to improve the following deficits and impairments:  Pain, Postural dysfunction, Increased muscle spasms, Decreased range of motion  Visit Diagnosis: Cervicalgia  Abnormal posture  Cramp and spasm  Tension-type headache, not intractable, unspecified chronicity pattern     Problem List Patient Active Problem List   Diagnosis Date Noted  . Neck pain 06/21/2016  . Migraine  headache without aura 06/21/2016  .  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 Cervical paraspinal muscle spasm 06/09/2016  . Chest pain 04/12/2016  . Arthritis of left lower extremity 05/29/2015  . Synovitis of knee 05/09/2015  . Fracture of first metatarsal bone 01/09/2015  . Right foot pain 12/27/2014  . Essential hypertension 12/27/2014  . Patellofemoral pain syndrome 08/12/2014  . Patellar pain 08/01/2014  . Pain of left lower extremity 08/01/2014  . Hx of migraine with aura 06/18/2013  . Elevated blood pressure reading 06/18/2013  . Bilateral anterior knee pain 09/23/2012  . Low back pain 09/23/2012  . Contraception management 03/18/2011  . Irregular menses 03/18/2011  . ELEVATED BLOOD PRESSURE WITHOUT DIAGNOSIS OF HYPERTENSION 03/02/2010  . AMENORRHEA 10/16/2008  . Hyperlipidemia 05/01/2008  . MIGRAINE HEADACHE 05/01/2008  . HEADACHE 05/01/2008  . LUMBAR STRAIN 05/01/2008    Darrel Hoover PT 07/13/2016, 3:44 PM  [facility] [facility] [address] [place], [place], [address] Phone: [phone]   Fax:  [phone]  Name: Teresa Hickman MRN: [id number] Date of Birth: [birth year]

## 2016-07-14 ENCOUNTER — Encounter: Payer: Self-pay | Admitting: Diagnostic Neuroimaging

## 2016-07-14 ENCOUNTER — Ambulatory Visit (INDEPENDENT_AMBULATORY_CARE_PROVIDER_SITE_OTHER): Payer: 59 | Admitting: Diagnostic Neuroimaging

## 2016-07-14 VITALS — BP 143/80 | HR 89 | Wt 195.0 lb

## 2016-07-14 DIAGNOSIS — M6249 Contracture of muscle, multiple sites: Secondary | ICD-10-CM | POA: Diagnosis not present

## 2016-07-14 DIAGNOSIS — G43009 Migraine without aura, not intractable, without status migrainosus: Secondary | ICD-10-CM | POA: Diagnosis not present

## 2016-07-14 DIAGNOSIS — M62838 Other muscle spasm: Secondary | ICD-10-CM

## 2016-07-14 DIAGNOSIS — M5481 Occipital neuralgia: Secondary | ICD-10-CM | POA: Diagnosis not present

## 2016-07-14 MED ORDER — GABAPENTIN 300 MG PO CAPS: 300.0000 mg | ORAL_CAPSULE | Freq: Three times a day (TID) | ORAL | 12 refills | Status: DC

## 2016-07-14 NOTE — Patient Instructions (Addendum)
-   continue gabapentin 300mg  three times per day  - continue physical therapy

## 2016-07-14 NOTE — Progress Notes (Signed)
[facility]  PATIENT: Teresa Hickman DOB: 08/18/1971  REFERRING CLINICIAN: Dr. Jonelle Sidle (via Dr. Leonie Man) HISTORY FROM: patient (accompanied by daughter and son) REASON FOR VISIT: follow up   HISTORICAL  CHIEF COMPLAINT:  Chief Complaint  Patient presents with  . Headache    rm 7, cervicogenic HA, dgtr-Amina, son- Rabiu, "HA is much better, no burning; they don't go away but are better"  . Follow-up    HISTORY OF PRESENT ILLNESS:   UPDATE 07/14/16: Since last visit, tried occipital nerve blocks without relief (on 06/21/16). However in last 1-2 weeks, now feeling better. Tolerating gabapentin 300mg  TID.   PRIOR HPI (07/14/16): 45 year old female here for evaluation of headaches and neck pain. For past 3 weeks patient has new onset pain in the mid thoracic region, radiating to the neck and head. She describes right or left sided radiating pain in her scalp with burning sensation. No nausea or vomiting. No throbbing sensation. She has some photophobia. Patient also has aching pressure sensation. No recent infections, trauma, change in sleep or stress. No change in medications, physical activity or other triggering factors. Patient has been to PCP and sports medicine doctors and treated conservatively for musculoskeletal problems. Patient has been tried on gabapentin, tizanidine, ibuprofen without relief. Patient taking gabapentin 100 mg 3 times a day. Patient has history of migraine headaches from age 89 years old. She describes right or left-sided pressure sensation associated with blurred vision, nausea, vomiting, photophobia. Triggering factors include sleep deprivation or stress. Patient was having 1-2 headaches per month but these stopped approximately 2 years ago. Patient previously used to take ibuprofen, lay down in a dark quiet room and go to sleep. Patient drinks 1 cup of coffee per day. Patient goes to sleep at 10 PM and wakes up at 6 AM. After dropping her child off at  school she returns home in an takes a nap from 8 AM to 11 AM. She denies any other excessive daytime sleepiness or snoring. She has never had a sleep study.    REVIEW OF SYSTEMS: Full 14 system review of systems performed and negative with exception as per HPI.   ALLERGIES: No Known Allergies  HOME MEDICATIONS: Outpatient Medications Prior to Visit  Medication Sig Dispense Refill  . atorvastatin (LIPITOR) 20 MG tablet Take 1 tablet (20 mg total) by mouth daily. (Patient not taking: Reported on 06/29/2016) 90 tablet 1  . hydrochlorothiazide (HYDRODIURIL) 25 MG tablet Take 0.5 tablets (12.5 mg total) by mouth daily. (Patient not taking: Reported on 07/14/2016) 45 tablet 1  . ibuprofen (ADVIL,MOTRIN) 600 MG tablet Take 600 mg by mouth every 8 (eight) hours as needed for mild pain.    Marland Kitchen omeprazole (PRILOSEC) 20 MG capsule Take 1 capsule (20 mg total) by mouth daily. (Patient not taking: Reported on 07/14/2016) 30 capsule 0  . tiZANidine (ZANAFLEX) 4 MG tablet Take 1 tablet (4 mg total) by mouth Nightly. (Patient not taking: Reported on 07/14/2016) 30 tablet 2  . gabapentin (NEURONTIN) 100 MG capsule Take 1-3 capsules (100-300 mg total) by mouth 3 (three) times daily. (Patient not taking: Reported on 07/14/2016) 270 capsule 6   No facility-administered medications prior to visit.     PAST MEDICAL HISTORY: Past Medical History:  Diagnosis Date  . Dysmenorrhea   . Essential hypertension 12/27/2014  . Hyperlipidemia   . Migraine    with some aura    PAST SURGICAL HISTORY: Past Surgical History:  Procedure Laterality Date  . NO PAST SURGERIES  FAMILY HISTORY: Family History  Problem Relation Age of Onset  . Hypertension Mother   . Diabetes Mother   . Stroke Mother     76s [place]  2010    SOCIAL HISTORY:  Social History   Social History  . Marital status: Married    Spouse name: Lawal  . Number of children: 4  . Years of education: 16   Occupational History  . Unemployed     Social History Main Topics  . Smoking status: Never Smoker  . Smokeless tobacco: Not on file  . Alcohol use No  . Drug use: No  . Sexual activity: No   Other Topics Concern  . Not on file   Social History Narrative   Lives with husband and son, [name]   From [place]   Medical Family   HH of 6    No pets   G4 p4    Caffeine use: 1 cup coffee per day     PHYSICAL EXAM  GENERAL EXAM/CONSTITUTIONAL: Vitals:  Vitals:   07/14/16 1432  BP: (!) 143/80  Pulse: 89  Weight: 195 lb (88.5 kg)   Body mass index is 31.47 kg/m. No exam data present  Patient is in no distress; well developed, nourished and groomed; neck is supple  CARDIOVASCULAR:  Examination of carotid arteries is normal; no carotid bruits  Regular rate and rhythm, no murmurs  Examination of peripheral vascular system by observation and palpation is normal  EYES:  Ophthalmoscopic exam of optic discs and posterior segments is normal; no papilledema or hemorrhages  MUSCULOSKELETAL:  Gait, strength, tone, movements noted in Neurologic exam below  NEUROLOGIC: MENTAL STATUS:  No flowsheet data found.  awake, alert, oriented to person, place and time  recent and remote memory intact  normal attention and concentration  language fluent, comprehension intact, naming intact,   fund of knowledge appropriate  CRANIAL NERVE:   2nd - no papilledema on fundoscopic exam  2nd, 3rd, 4th, 6th - pupils equal and reactive to light, visual fields full to confrontation, extraocular muscles intact, no nystagmus  5th - facial sensation symmetric  7th - facial strength symmetric  8th - hearing intact  9th - palate elevates symmetrically, uvula midline  11th - shoulder shrug symmetric  12th - tongue protrusion midline  MOTOR:   normal bulk and tone, full strength in the BUE, BLE  SENSORY:   normal and symmetric to light touch, temperature, vibration  COORDINATION:   finger-nose-finger, fine  finger movements normal  REFLEXES:   deep tendon reflexes TRACE and symmetric  GAIT/STATION:   narrow based gait    DIAGNOSTIC DATA (LABS, IMAGING, TESTING) - I reviewed patient records, labs, notes, testing and imaging myself where available.  Lab Results  Component Value Date   WBC 7.1 09/03/2015   HGB 12.4 09/03/2015   HCT 39.1 09/03/2015   MCV 86.7 09/03/2015   PLT 330.0 09/03/2015      Component Value Date/Time   NA 138 03/02/2016 1125   K 3.5 03/02/2016 1125   CL 99 03/02/2016 1125   CO2 30 03/02/2016 1125   GLUCOSE 86 03/02/2016 1125   BUN 10 03/02/2016 1125   CREATININE 0.57 03/02/2016 1125   CALCIUM 9.4 03/02/2016 1125   PROT 7.2 09/03/2015 1003   ALBUMIN 3.6 09/03/2015 1003   AST 12 09/03/2015 1003   ALT 13 09/03/2015 1003   ALKPHOS 66 09/03/2015 1003   BILITOT 0.4 09/03/2015 1003   GFRNONAA >60 04/14/2015 1831   GFRAA >  60 04/14/2015 1831   Lab Results  Component Value Date   CHOL 218 (H) 06/25/2016   HDL 51.30 06/25/2016   LDLCALC 152 (H) 06/25/2016   LDLDIRECT 176.4 10/09/2008   TRIG 75.0 06/25/2016   CHOLHDL 4 06/25/2016   No results found for: HGBA1C No results found for: VITAMINB12 Lab Results  Component Value Date   TSH 2.31 09/03/2015   05/06/16 myocardial perfusion stress test  Nuclear stress EF: 72%.  There was no ST segment deviation noted during stress.  The study is normal. no evidence of ischemia.  This is a low risk study.  The left ventricular ejection fraction is hyperdynamic (>65%).  06/09/16 MRI brain [I reviewed images myself and agree with interpretation. -VRP]  - cerebellar tonsillar ectopia without a Chiari malformation (~16mm) - nonspecific single 4 mm subcortical T2 hyperintensity in the high posterior left frontal lobe. This is likely within normal limits for age. - otherwise normal MRI appearance of the brain.  06/09/16 MRI cervical spine [I reviewed images myself and agree with interpretation. -VRP]  -  shallow central disc protrusion at C5-6 without significant stenosis. - no other significant cervical disc disease or stenosis.    ASSESSMENT AND PLAN  45 y.o. year old female here with history of migraine headaches with aura since age 31 years old, now with new type of headache and neck pain since June 2017. Could represent cervicogenic headache, migraine variant or occipital neuralgia.   Ddx: cervicogenic headache, migraine variant, occipital neuralgia  1. Migraine without aura and without status migrainosus, not intractable   2. Cervical paraspinal muscle spasm   3. Bilateral occipital neuralgia      PLAN: - continue gabapentin 300mg  three times per day - consider sleep study in future (risk of sleep apnea)  Meds ordered this encounter  Medications  . gabapentin (NEURONTIN) 300 MG capsule    Sig: Take 1 capsule (300 mg total) by mouth 3 (three) times daily.    Dispense:  90 capsule    Refill:  12   Return in about 3 months (around 10/14/2016).     Penni Bombard, MD [id number], 123456 PM Certified in Neurology, Neurophysiology and Neuroimaging  [facility] [address], [place], [place] [address] [phone]

## 2016-07-15 ENCOUNTER — Ambulatory Visit: Payer: 59 | Admitting: Physical Therapy

## 2016-07-19 ENCOUNTER — Ambulatory Visit: Payer: 59 | Admitting: Diagnostic Neuroimaging

## 2016-07-21 ENCOUNTER — Ambulatory Visit: Payer: 59 | Admitting: Physical Therapy

## 2016-07-21 DIAGNOSIS — R293 Abnormal posture: Secondary | ICD-10-CM

## 2016-07-21 DIAGNOSIS — M542 Cervicalgia: Secondary | ICD-10-CM | POA: Diagnosis not present

## 2016-07-21 DIAGNOSIS — R252 Cramp and spasm: Secondary | ICD-10-CM

## 2016-07-21 DIAGNOSIS — G44209 Tension-type headache, unspecified, not intractable: Secondary | ICD-10-CM

## 2016-07-21 NOTE — Therapy (Signed)
[place], [place], [address] Phone: [phone]   Fax:  [phone]  Physical Therapy Treatment  Patient Details  Name: Teresa Hickman MRN: [id number] Date of Birth: Sep 24, 1971 Referring Provider: Kary Kos , MD  Encounter Date: 07/21/2016      PT End of Session - 07/21/16 1503    Visit Number 2   Number of Visits 8   Date for PT Re-Evaluation 08/13/16   PT Start Time [id number]   PT Stop Time 1505   PT Time Calculation (min) 49 min   Activity Tolerance Patient tolerated treatment well   Behavior During Therapy [facility] for tasks assessed/performed      Past Medical History:  Diagnosis Date  . Dysmenorrhea   . Essential hypertension 12/27/2014  . Hyperlipidemia   . Migraine    with some aura    Past Surgical History:  Procedure Laterality Date  . NO PAST SURGERIES      There were no vitals filed for this visit.      Subjective Assessment - 07/21/16 1416    Subjective "yesterday it was really bad, and today I have a headache"    Currently in Pain? Yes   Pain Score 7    Pain Location Head   Pain Orientation Left   Pain Type Chronic pain   Pain Onset More than a month ago   Pain Frequency Constant   Aggravating Factors  concentrating driving,    Pain Relieving Factors ice,                          [facility] Adult PT Treatment/Exercise - 07/21/16 0001      Modalities   Modalities Moist Heat     Moist Heat Therapy   Number Minutes Moist Heat 10 Minutes   Moist Heat Location Shoulder  pt in sitting     Manual Therapy   Manual Therapy Soft tissue mobilization;Myofascial release;Joint mobilization   Joint Mobilization T1-T7 Grade 2 P>A mobs    Soft tissue mobilization IASTM along bil upper traps/ levator scapulae   Myofascial Release stretching/ rolling over bil upper traps           Trigger Point Dry Needling - 07/21/16 1422    Consent Given? Yes   Education Handout  Provided Yes   Muscles Treated Upper Body Upper trapezius   Upper Trapezius Response Twitch reponse elicited;Palpable increased muscle length  2 x bil with pistoning technique              PT Education - 07/21/16 1503    Education Details dry needling edcuation benefits, what to expect and aftercare, anatomy of the upper traps and their referral pattern.    Person(s) Educated Patient   Methods Explanation;Verbal cues   Comprehension Verbalized understanding;Verbal cues required             PT Long Term Goals - 07/13/16 1542      PT LONG TERM GOAL #1   Title She will deom understanding of all exercises issued    Time 4   Period Weeks   Status New     PT LONG TERM GOAL #2   Title She will improve posture awareness  and demo awareness fo this   Time 4   Period Weeks   Status New     PT LONG TERM GOAL #3   Title She will report headaches as intermittant   Time 4  Period Weeks   Status New     PT LONG TERM GOAL #4   Title She will report neck pain as intermittant   Time 4   Period Weeks   Status New     PT LONG TERM GOAL #5   Title She will report pain intensity as 75% less   Time 4   Period Weeks   Status New               Plan - 07/21/16 1504    Clinical Impression Statement Teresa Hickman reports significant pain in the the upper traps and HA today. pt provided consent for DN of bil upper traps; pt was monitored throughout treatment. following Soft tissue work and mobs, utilized MHP post session to calm down soreness from DN, which she reported pain dropped to 3/10 and had no HA.    PT Next Visit Plan assess response from DN, Manual for spassm nad retraction ROM of neck , modalities as needed for pain , posture review   Consulted and Agree with Plan of Care Patient      Patient will benefit from skilled therapeutic intervention in order to improve the following deficits and impairments:  Pain, Postural dysfunction, Increased muscle spasms, Decreased  range of motion  Visit Diagnosis: Cervicalgia  Abnormal posture  Cramp and spasm  Tension-type headache, not intractable, unspecified chronicity pattern     Problem List Patient Active Problem List   Diagnosis Date Noted  . Neck pain 06/21/2016  . Migraine headache without aura 06/21/2016  . Cerebellar tonsillar ectopia ([place]) 06/11/2016  . Cervical paraspinal muscle spasm 06/09/2016  . Chest pain 04/12/2016  . Arthritis of left lower extremity 05/29/2015  . Synovitis of knee 05/09/2015  . Fracture of first metatarsal bone 01/09/2015  . Right foot pain 12/27/2014  . Essential hypertension 12/27/2014  . Patellofemoral pain syndrome 08/12/2014  . Patellar pain 08/01/2014  . Pain of left lower extremity 08/01/2014  . Hx of migraine with aura 06/18/2013  . Elevated blood pressure reading 06/18/2013  . Bilateral anterior knee pain 09/23/2012  . Low back pain 09/23/2012  . Contraception management 03/18/2011  . Irregular menses 03/18/2011  . ELEVATED BLOOD PRESSURE WITHOUT DIAGNOSIS OF HYPERTENSION 03/02/2010  . AMENORRHEA 10/16/2008  . Hyperlipidemia 05/01/2008  . MIGRAINE HEADACHE 05/01/2008  . HEADACHE 05/01/2008  . LUMBAR STRAIN 05/01/2008   Starr Lake PT, DPT, LAT, ATC  07/21/16  3:06 PM      [facility] [address] [place], [place], [address] Phone: [phone]   Fax:  [phone]  Name: Teresa Hickman MRN: [id number] Date of Birth: 10-31-1971

## 2016-07-27 ENCOUNTER — Ambulatory Visit: Payer: 59 | Admitting: Physical Therapy

## 2016-07-27 DIAGNOSIS — G44209 Tension-type headache, unspecified, not intractable: Secondary | ICD-10-CM

## 2016-07-27 DIAGNOSIS — R293 Abnormal posture: Secondary | ICD-10-CM

## 2016-07-27 DIAGNOSIS — R252 Cramp and spasm: Secondary | ICD-10-CM

## 2016-07-27 DIAGNOSIS — M542 Cervicalgia: Secondary | ICD-10-CM | POA: Diagnosis not present

## 2016-07-27 NOTE — Therapy (Signed)
[place], [place], [address] Phone: [phone]   Fax:  [phone]  Physical Therapy Treatment  Patient Details  Name: Teresa Hickman MRN: [id number] Date of Birth: 1971-07-02 Referring Provider: Kary Kos , MD  Encounter Date: 07/27/2016      PT End of Session - 07/27/16 1545    Visit Number 3   Number of Visits 8   Date for PT Re-Evaluation 08/13/16   Authorization Type UMR    PT Start Time 1502   PT Stop Time 1558   PT Time Calculation (min) 56 min   Activity Tolerance Patient tolerated treatment well   Behavior During Therapy [facility] for tasks assessed/performed      Past Medical History:  Diagnosis Date  . Dysmenorrhea   . Essential hypertension 12/27/2014  . Hyperlipidemia   . Migraine    with some aura    Past Surgical History:  Procedure Laterality Date  . NO PAST SURGERIES      There were no vitals filed for this visit.      Subjective Assessment - 07/27/16 1508    Subjective "I am not doing any good today, its really warm today and its painfull with HA only on the R side" pt reported relief only last 2-3 days.    Currently in Pain? Yes   Pain Score 6   using wong baker scale   Pain Location Neck   Pain Orientation Right   Pain Descriptors / Indicators Aching;Tightness   Pain Type Chronic pain   Pain Onset More than a month ago   Pain Frequency Constant                         [facility] Adult PT Treatment/Exercise - 07/27/16 0001      Moist Heat Therapy   Number Minutes Moist Heat 10 Minutes   Moist Heat Location Shoulder  pt in prone     Manual Therapy   Joint Mobilization C3-C7 grade 2 P>A  ,T1-T7 Grade 3 P>A mobs    Soft tissue mobilization IASTM along bil upper traps/ levator scapulae   Myofascial Release stretching/ rolling over bil upper traps           Trigger Point Dry Needling - 07/27/16 1514    Consent Given? Yes   Education Handout  Provided No  given prevoulsy   Muscles Treated Upper Body Upper trapezius;Longissimus   Upper Trapezius Response Twitch reponse elicited;Palpable increased muscle length  R x 3, L x 1 with pistoning technique   Longissimus Response Palpable increased muscle length;Twitch response elicited  [id number] x 1 ea. with pistoning technique                   PT Long Term Goals - 07/27/16 1548      PT LONG TERM GOAL #1   Title She will deom understanding of all exercises issued    Time 4   Period Weeks   Status On-going     PT LONG TERM GOAL #2   Title She will improve posture awareness  and demo awareness fo this   Time 4   Period Weeks   Status On-going     PT LONG TERM GOAL #3   Title She will report headaches as intermittant   Time 4   Period Weeks   Status On-going     PT LONG TERM GOAL #4   Title She will report neck  pain as intermittant   Time 4   Period Weeks   Status On-going     PT LONG TERM GOAL #5   Title She will report pain intensity as 75% less   Time 4   Period Weeks   Status On-going               Plan - 07/27/16 1546    Clinical Impression Statement [name] reported continued HA and shouler pain on the R today rated at 6/10. continued DN on bil upper traps and R cervical multifidus at C3-C6; pt monitored throughout treatment. following Dn soft tissue work was performe and cervical/ upper thoracic mobs to improve mobillity. post session pt reported no HA and pain dropped to 4/10.    PT Next Visit Plan assess response from DN, Manual for spassm and retraction ROM of neck , modalities as needed for pain , posture review   Consulted and Agree with Plan of Care Patient      Patient will benefit from skilled therapeutic intervention in order to improve the following deficits and impairments:  Pain, Postural dysfunction, Increased muscle spasms, Decreased range of motion  Visit Diagnosis: Cervicalgia  Abnormal posture  Cramp and  spasm  Tension-type headache, not intractable, unspecified chronicity pattern     Problem List Patient Active Problem List   Diagnosis Date Noted  . Neck pain 06/21/2016  . Migraine headache without aura 06/21/2016  . Cerebellar tonsillar ectopia ([place]) 06/11/2016  . Cervical paraspinal muscle spasm 06/09/2016  . Chest pain 04/12/2016  . Arthritis of left lower extremity 05/29/2015  . Synovitis of knee 05/09/2015  . Fracture of first metatarsal bone 01/09/2015  . Right foot pain 12/27/2014  . Essential hypertension 12/27/2014  . Patellofemoral pain syndrome 08/12/2014  . Patellar pain 08/01/2014  . Pain of left lower extremity 08/01/2014  . Hx of migraine with aura 06/18/2013  . Elevated blood pressure reading 06/18/2013  . Bilateral anterior knee pain 09/23/2012  . Low back pain 09/23/2012  . Contraception management 03/18/2011  . Irregular menses 03/18/2011  . ELEVATED BLOOD PRESSURE WITHOUT DIAGNOSIS OF HYPERTENSION 03/02/2010  . AMENORRHEA 10/16/2008  . Hyperlipidemia 05/01/2008  . MIGRAINE HEADACHE 05/01/2008  . HEADACHE 05/01/2008  . LUMBAR STRAIN 05/01/2008   Starr Lake PT, DPT, LAT, ATC  07/27/16  3:58 PM      [place] [facility] [address] [place], [place], [address] Phone: [phone]   Fax:  [phone]  Name: Teresa Hickman MRN: [id number] Date of Birth: [birth year]

## 2016-08-03 ENCOUNTER — Ambulatory Visit: Payer: 59 | Admitting: Physical Therapy

## 2016-08-03 DIAGNOSIS — M542 Cervicalgia: Secondary | ICD-10-CM

## 2016-08-03 DIAGNOSIS — R293 Abnormal posture: Secondary | ICD-10-CM | POA: Diagnosis not present

## 2016-08-03 DIAGNOSIS — G44209 Tension-type headache, unspecified, not intractable: Secondary | ICD-10-CM

## 2016-08-03 DIAGNOSIS — R252 Cramp and spasm: Secondary | ICD-10-CM

## 2016-08-03 NOTE — Therapy (Signed)
[place], [place], [address] Phone: [phone]   Fax:  [phone]  Physical Therapy Treatment  Patient Details  Name: Teresa Hickman MRN: [id number] Date of Birth: June 18, 1971 Referring Provider: Kary Kos , MD  Encounter Date: 08/03/2016      PT End of Session - 08/03/16 1531    Visit Number 4   Number of Visits 8   Date for PT Re-Evaluation 08/13/16   Authorization Type UMR    PT Start Time 1502   PT Stop Time [id number]   PT Time Calculation (min) 42 min   Activity Tolerance Patient tolerated treatment well   Behavior During Therapy [facility] for tasks assessed/performed      Past Medical History:  Diagnosis Date  . Dysmenorrhea   . Essential hypertension 12/27/2014  . Hyperlipidemia   . Migraine    with some aura    Past Surgical History:  Procedure Laterality Date  . NO PAST SURGERIES      There were no vitals filed for this visit.      Subjective Assessment - 08/03/16 1505    Subjective "I am doing much better today, minimal headache and only some soreness in the back of the head"    Currently in Pain? Yes   Pain Score 4    Pain Location Neck   Pain Orientation Right   Pain Descriptors / Indicators Aching   Pain Type Chronic pain   Pain Onset More than a month ago   Pain Frequency Constant   Aggravating Factors  N/A   Pain Relieving Factors N/A                         [facility] Adult PT Treatment/Exercise - 08/03/16 1519      Neck Exercises: Seated   Shoulder ABduction --   Other Seated Exercise ceiling punchs 2 x 10   Other Seated Exercise thoracic extension over chair 2 x 10  with arms crossed chest, verbal visual cues for form     Neck Exercises: Supine   Neck Retraction 10 reps;5 secs  verbal cues for form   Shoulder ABduction 10 reps;Both  horizontal with red theraband     Manual Therapy   Manual therapy comments sub-occipital release x 6 min     Neck  Exercises: Stretches   Upper Trapezius Stretch 2 reps;30 seconds   Levator Stretch 2 reps;30 seconds                PT Education - 08/03/16 1530    Education provided Yes   Education Details Updated HEP with proper form and treatment reationale, posture education with sitting/ standing.    Person(s) Educated Patient   Methods Explanation;Verbal cues;Handout;Demonstration   Comprehension Verbalized understanding;Verbal cues required;Returned demonstration             PT Long Term Goals - 07/27/16 1548      PT LONG TERM GOAL #1   Title She will deom understanding of all exercises issued    Time 4   Period Weeks   Status On-going     PT LONG TERM GOAL #2   Title She will improve posture awareness  and demo awareness fo this   Time 4   Period Weeks   Status On-going     PT LONG TERM GOAL #3   Title She will report headaches as intermittant   Time 4   Period Weeks  Status On-going     PT LONG TERM GOAL #4   Title She will report neck pain as intermittant   Time 4   Period Weeks   Status On-going     PT LONG TERM GOAL #5   Title She will report pain intensity as 75% less   Time 4   Period Weeks   Status On-going               Plan - 08/03/16 1545    Clinical Impression Statement Mrs. Charbonneau reports decreased pain today and minimal headache. peroformed sub-occipital release and thoracic mobility and scapular stability exercises which she did well with today require multiple verbal/ tactile cues for proper form. she delined modalities post session.    PT Next Visit Plan , Manual for spassm and retraction ROM of neck , modalities as needed for pain   PT Home Exercise Plan rows, thoracic extension, horizontal abduciton   Consulted and Agree with Plan of Care Patient      Patient will benefit from skilled therapeutic intervention in order to improve the following deficits and impairments:  Pain, Postural dysfunction, Increased muscle spasms, Decreased  range of motion  Visit Diagnosis: Cervicalgia  Abnormal posture  Cramp and spasm  Tension-type headache, not intractable, unspecified chronicity pattern     Problem List Patient Active Problem List   Diagnosis Date Noted  . Neck pain 06/21/2016  . Migraine headache without aura 06/21/2016  . Cerebellar tonsillar ectopia ([place]) 06/11/2016  . Cervical paraspinal muscle spasm 06/09/2016  . Chest pain 04/12/2016  . Arthritis of left lower extremity 05/29/2015  . Synovitis of knee 05/09/2015  . Fracture of first metatarsal bone 01/09/2015  . Right foot pain 12/27/2014  . Essential hypertension 12/27/2014  . Patellofemoral pain syndrome 08/12/2014  . Patellar pain 08/01/2014  . Pain of left lower extremity 08/01/2014  . Hx of migraine with aura 06/18/2013  . Elevated blood pressure reading 06/18/2013  . Bilateral anterior knee pain 09/23/2012  . Low back pain 09/23/2012  . Contraception management 03/18/2011  . Irregular menses 03/18/2011  . ELEVATED BLOOD PRESSURE WITHOUT DIAGNOSIS OF HYPERTENSION 03/02/2010  . AMENORRHEA 10/16/2008  . Hyperlipidemia 05/01/2008  . MIGRAINE HEADACHE 05/01/2008  . HEADACHE 05/01/2008  . LUMBAR STRAIN 05/01/2008   Starr Lake PT, DPT, LAT, ATC  08/03/16  3:48 PM      [facility] [address] [place], [place], [address] Phone: [phone]   Fax:  [phone]  Name: Teresa Hickman MRN: [id number] Date of Birth: 10-Jan-1971

## 2016-08-03 NOTE — Patient Instructions (Addendum)

## 2016-08-06 ENCOUNTER — Encounter: Payer: 59 | Admitting: Physical Therapy

## 2016-08-09 ENCOUNTER — Ambulatory Visit: Payer: 59 | Admitting: Physical Therapy

## 2016-08-09 DIAGNOSIS — M542 Cervicalgia: Secondary | ICD-10-CM | POA: Diagnosis not present

## 2016-08-09 DIAGNOSIS — R252 Cramp and spasm: Secondary | ICD-10-CM

## 2016-08-09 DIAGNOSIS — R293 Abnormal posture: Secondary | ICD-10-CM

## 2016-08-09 DIAGNOSIS — G44209 Tension-type headache, unspecified, not intractable: Secondary | ICD-10-CM

## 2016-08-09 NOTE — Therapy (Signed)
[place], [place], [address] Phone: [phone]   Fax:  [phone]  Physical Therapy Treatment  Patient Details  Name: Teresa Hickman MRN: [id number] Date of Birth: 10-Jul-1971 Referring Provider: Kary Hickman , MD  Encounter Date: 08/09/2016      PT End of Session - 08/09/16 1148    Visit Number 5   Number of Visits 8   Date for PT Re-Evaluation 08/13/16   PT Start Time 7209   PT Stop Time 1146   PT Time Calculation (min) 44 min   Activity Tolerance Patient tolerated treatment well   Behavior During Therapy [facility] for tasks assessed/performed      Past Medical History:  Diagnosis Date  . Dysmenorrhea   . Essential hypertension 12/27/2014  . Hyperlipidemia   . Migraine    with some aura    Past Surgical History:  Procedure Laterality Date  . NO PAST SURGERIES      There were no vitals filed for this visit.      Subjective Assessment - 08/09/16 1107    Subjective "still having some soreness inthe R shoulder and soreness in both sides of the neck" pt reports still getting headaches but they are getting better.    Currently in Pain? Yes   Pain Score 3    Pain Location Neck   Pain Orientation Right   Pain Descriptors / Indicators Aching   Pain Type Chronic pain   Pain Onset More than a month ago   Pain Frequency Intermittent   Aggravating Factors  pressing on the muscles in the  Rupper trap   Pain Relieving Factors ice,             [facility] PT Assessment - 08/09/16 0001      AROM   Cervical Flexion 68   Cervical Extension 40  verbal cues to avoid leaning back, pain noted at end range   Cervical - Right Side Bend 60   Cervical - Left Side Bend 60   Cervical - Right Rotation 78   Cervical - Left Rotation 78                     [facility] Adult PT Treatment/Exercise - 08/09/16 0001      Manual Therapy   Manual therapy comments sub-occipital release x 6 min   Joint  Mobilization C3-C7 grade 2 P>A  ,T1-T7 Grade 3 P>A mobs    Soft tissue mobilization IASTM along r upper traps/ levator scapulae   Myofascial Release stretching/ rolling over R upper traps      Neck Exercises: Stretches   Upper Trapezius Stretch 2 reps;30 seconds   Levator Stretch 2 reps;30 seconds          Trigger Point Dry Needling - 08/09/16 1132    Consent Given? Yes   Education Handout Provided No  given previousl   Upper Trapezius Response Twitch reponse elicited;Palpable increased muscle length  x 1 on R only              PT Education - 08/09/16 1147    Education provided Yes   Education Details reviewed throacic extention execises, educated about trigger points and effect on referred pain. how to perform manual trigger point release and how relieve them using tennis balls or theracane and where she can purchase one.    Person(s) Educated Patient   Methods Explanation;Demonstration;Verbal cues;Tactile cues   Comprehension Verbalized understanding;Verbal cues required  PT Long Term Goals - 08/09/16 1137      PT LONG TERM GOAL #1   Title She will deom understanding of all exercises issued    Time 4   Period Weeks   Status Partially Met     PT LONG TERM GOAL #2   Title She will improve posture awareness  and demo awareness fo this   Time 4   Period Weeks   Status Partially Met     PT LONG TERM GOAL #3   Title She will report headaches as intermittant   Time 4   Period Weeks   Status Achieved     PT LONG TERM GOAL #4   Title She will report neck pain as intermittant   Time 4   Period Weeks   Status Achieved     PT LONG TERM GOAL #5   Title She will report pain intensity as 75% less   Time 4   Period Weeks   Status Achieved               Plan - 08/09/16 1148    Clinical Impression Statement Teresa Hickman reported increased tightness in the R upper trap today. DN was performed followed by soft tissue work and stretching which  she reported no referred pain. She is demonstrating improvement in cervical mobility and decreased pain, pt declined modalities post session today.    PT Next Visit Plan , Manual for spassm and retraction ROM of neck , modalities as needed for pain,   PT Home Exercise Plan thoracic extension review (using phyisoball), manual tirgger point release,    Consulted and Agree with Plan of Care Patient      Patient will benefit from skilled therapeutic intervention in order to improve the following deficits and impairments:  Pain, Postural dysfunction, Increased muscle spasms, Decreased range of motion  Visit Diagnosis: Cervicalgia  Abnormal posture  Cramp and spasm  Tension-type headache, not intractable, unspecified chronicity pattern     Problem List Patient Active Problem List   Diagnosis Date Noted  . Neck pain 06/21/2016  . Migraine headache without aura 06/21/2016  . Cerebellar tonsillar ectopia ([place]) 06/11/2016  . Cervical paraspinal muscle spasm 06/09/2016  . Chest pain 04/12/2016  . Arthritis of left lower extremity 05/29/2015  . Synovitis of knee 05/09/2015  . Fracture of first metatarsal bone 01/09/2015  . Right foot pain 12/27/2014  . Essential hypertension 12/27/2014  . Patellofemoral pain syndrome 08/12/2014  . Patellar pain 08/01/2014  . Pain of left lower extremity 08/01/2014  . Hx of migraine with aura 06/18/2013  . Elevated blood pressure reading 06/18/2013  . Bilateral anterior knee pain 09/23/2012  . Low back pain 09/23/2012  . Contraception management 03/18/2011  . Irregular menses 03/18/2011  . ELEVATED BLOOD PRESSURE WITHOUT DIAGNOSIS OF HYPERTENSION 03/02/2010  . AMENORRHEA 10/16/2008  . Hyperlipidemia 05/01/2008  . MIGRAINE HEADACHE 05/01/2008  . HEADACHE 05/01/2008  . LUMBAR STRAIN 05/01/2008   Teresa Hickman PT, DPT, LAT, ATC  08/09/16  11:52 AM      [facility] [address] [place], [place], [address] Phone: [phone]   Fax:  [phone]  Name: Teresa Hickman MRN: [id number] Date of Birth: [birth year]

## 2016-08-10 ENCOUNTER — Encounter: Payer: 59 | Admitting: Physical Therapy

## 2016-08-12 ENCOUNTER — Encounter: Payer: 59 | Admitting: Physical Therapy

## 2016-08-12 DIAGNOSIS — M542 Cervicalgia: Secondary | ICD-10-CM | POA: Diagnosis not present

## 2016-08-12 DIAGNOSIS — Z6833 Body mass index (BMI) 33.0-33.9, adult: Secondary | ICD-10-CM | POA: Diagnosis not present

## 2016-08-13 ENCOUNTER — Other Ambulatory Visit: Payer: Self-pay | Admitting: Neurosurgery

## 2016-08-13 DIAGNOSIS — G939 Disorder of brain, unspecified: Secondary | ICD-10-CM

## 2016-08-17 ENCOUNTER — Ambulatory Visit: Payer: 59 | Attending: General Practice

## 2016-08-17 DIAGNOSIS — R293 Abnormal posture: Secondary | ICD-10-CM | POA: Insufficient documentation

## 2016-08-17 DIAGNOSIS — G44209 Tension-type headache, unspecified, not intractable: Secondary | ICD-10-CM | POA: Insufficient documentation

## 2016-08-17 DIAGNOSIS — M542 Cervicalgia: Secondary | ICD-10-CM | POA: Diagnosis not present

## 2016-08-17 DIAGNOSIS — R252 Cramp and spasm: Secondary | ICD-10-CM | POA: Insufficient documentation

## 2016-08-17 NOTE — Therapy (Signed)
[place], [place], [address] Phone: [phone]   Fax:  [phone]  Physical Therapy Treatment  Patient Details  Name: Teresa Hickman MRN: [id number] Date of Birth: [birth year] Referring Provider: Kary Kos , MD  Encounter Date: 08/17/2016      PT End of Session - 08/17/16 1627    Visit Number 6   Number of Visits 8   Date for PT Re-Evaluation 08/19/16   PT Start Time 0346   PT Stop Time 0424   PT Time Calculation (min) 38 min   Activity Tolerance Patient tolerated treatment well   Behavior During Therapy [facility] for tasks assessed/performed      Past Medical History:  Diagnosis Date  . Dysmenorrhea   . Essential hypertension 12/27/2014  . Hyperlipidemia   . Migraine    with some aura    Past Surgical History:  Procedure Laterality Date  . NO PAST SURGERIES      There were no vitals filed for this visit.      Subjective Assessment - 08/17/16 1551    Subjective She reports 70% better .   Head aches better now intermittant.   LAst headache 2-3 days ago.   She reports doing HEP  daily or more.  Last pain in shoulder better from last treatment. Needling helped    Currently in Pain? No/denies                         [facility] Adult PT Treatment/Exercise - 08/17/16 0001      Neck Exercises: Seated   Other Seated Exercise Worked on options for thoracic extension/rotation and side bending with use of table/wall/and arms folded all with fore head on arms or hands to limit cervical extension                 PT Education - 08/17/16 1627    Education provided Yes   Education Details Thoracic stretchig, possible discharge next visit   Person(s) Educated Patient   Methods Explanation;Demonstration;Tactile cues;Verbal cues;Handout   Comprehension Verbalized understanding             PT Long Term Goals - 08/17/16 1631      PT LONG TERM GOAL #1   Title She will deom  understanding of all exercises issued    Status Partially Met     PT LONG TERM GOAL #2   Title She will improve posture awareness  and demo awareness fo this   Status Partially Met     PT LONG TERM GOAL #3   Title She will report headaches as intermittant   Status Achieved     PT LONG TERM GOAL #4   Title She will report neck pain as intermittant   Status Achieved     PT LONG TERM GOAL #5   Title She will report pain intensity as 75% less   Status Achieved               Plan - 08/17/16 1628    Clinical Impression Statement Much improved from eval with no pain last 2-3 days and pain intermittnat with last pain prior to last treatment. Asymetric thoracic rotaion noted and HEP isused today to address this   PT Treatment/Interventions Cryotherapy;Electrical Stimulation;Moist Heat;Traction;Ultrasound;Passive range of motion;Patient/family education;Manual techniques;Taping;Dry needling;Therapeutic exercise   PT Next Visit Plan HEP review, add as needed , manual/modalites , needling if needed  possible discharge next visit or if needed  2 more visits as per 8 visits in plan   PT Home Exercise Plan options for thoracic ROM   Consulted and Agree with Plan of Care Patient      Patient will benefit from skilled therapeutic intervention in order to improve the following deficits and impairments:  Pain, Postural dysfunction, Increased muscle spasms, Decreased range of motion  Visit Diagnosis: Cervicalgia  Abnormal posture  Tension-type headache, not intractable, unspecified chronicity pattern  Cramp and spasm     Problem List Patient Active Problem List   Diagnosis Date Noted  . Neck pain 06/21/2016  . Migraine headache without aura 06/21/2016  . 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 Cervical paraspinal muscle spasm 06/09/2016  . Chest pain 04/12/2016  . Arthritis of left lower extremity 05/29/2015  . Synovitis of knee 05/09/2015  . Fracture of first  metatarsal bone 01/09/2015  . Right foot pain 12/27/2014  . Essential hypertension 12/27/2014  . Patellofemoral pain syndrome 08/12/2014  . Patellar pain 08/01/2014  . Pain of left lower extremity 08/01/2014  . Hx of migraine with aura 06/18/2013  . Elevated blood pressure reading 06/18/2013  . Bilateral anterior knee pain 09/23/2012  . Low back pain 09/23/2012  . Contraception management 03/18/2011  . Irregular menses 03/18/2011  . ELEVATED BLOOD PRESSURE WITHOUT DIAGNOSIS OF HYPERTENSION 03/02/2010  . AMENORRHEA 10/16/2008  . Hyperlipidemia 05/01/2008  . MIGRAINE HEADACHE 05/01/2008  . HEADACHE 05/01/2008  . LUMBAR STRAIN 05/01/2008    Darrel Hoover  PT 08/17/2016, 4:32 PM  [facility] [address] [place], [place], [address] Phone: [phone]   Fax:  [phone]  Name: Teresa Hickman MRN: [id number] Date of Birth: [birth year]

## 2016-08-17 NOTE — Patient Instructions (Signed)
From cabinet options on thoracic ROM with arm openings, extension in space with reaching elbows toward ceiling( use leaning back over seat picture and side lying  Side bend stretching.   12-x/day 3-10 reps 10-30 sec and asked her to do minimum for next 2-3 times to not overdue and give upper back pain

## 2016-08-19 ENCOUNTER — Ambulatory Visit: Payer: 59 | Admitting: Physical Therapy

## 2016-08-20 ENCOUNTER — Ambulatory Visit: Payer: 59 | Admitting: Physical Therapy

## 2016-08-20 DIAGNOSIS — R293 Abnormal posture: Secondary | ICD-10-CM

## 2016-08-20 DIAGNOSIS — G44209 Tension-type headache, unspecified, not intractable: Secondary | ICD-10-CM | POA: Diagnosis not present

## 2016-08-20 DIAGNOSIS — M542 Cervicalgia: Secondary | ICD-10-CM | POA: Diagnosis not present

## 2016-08-20 DIAGNOSIS — R252 Cramp and spasm: Secondary | ICD-10-CM | POA: Diagnosis not present

## 2016-08-20 NOTE — Therapy (Signed)
[place], [place], [address] Phone: [phone]   Fax:  [phone]  Physical Therapy Treatment  Patient Details  Name: MAXCINE STRONG MRN: [id number] Date of Birth: 1971-05-06 Referring Provider: Kary Kos , MD  Encounter Date: 08/20/2016      PT End of Session - 08/20/16 1200    Visit Number 7   Number of Visits 8   Date for PT Re-Evaluation 08/19/16   Authorization Type UMR    PT Start Time 1200   PT Stop Time 7915   PT Time Calculation (min) 53 min      Past Medical History:  Diagnosis Date  . Dysmenorrhea   . Essential hypertension 12/27/2014  . Hyperlipidemia   . Migraine    with some aura    Past Surgical History:  Procedure Laterality Date  . NO PAST SURGERIES      There were no vitals filed for this visit.      Subjective Assessment - 08/20/16 1200    Subjective I was better but today I am hurting. Slight headache this morning   Currently in Pain? Yes   Pain Score 6    Pain Location Neck   Aggravating Factors  unknow today   Pain Relieving Factors ice                         [facility] Adult PT Treatment/Exercise - 08/20/16 0001      Neck Exercises: Theraband   Rows 15 reps  yellow   Rows Limitations max cues for correct scap squeeze     Neck Exercises: Seated   Postural Training scap squeezes x 10- max cues   Other Seated Exercise side lying book openings with hand on chest    Other Seated Exercise thoracic extension over chair 2 x 10  with arms crossed chest, verbal visual cues for form     Modalities   Modalities Ultrasound     Moist Heat Therapy   Number Minutes Moist Heat 15 Minutes   Moist Heat Location Cervical     Ultrasound   Ultrasound Location Bilateral upper traps   Ultrasound Parameters 1 Mhz 1.4 w/cm2 100% x 10 min   Ultrasound Goals Pain     Neck Exercises: Stretches   Upper Trapezius Stretch 2 reps;30 seconds                      PT Long Term Goals - 08/17/16 1631      PT LONG TERM GOAL #1   Title She will deom understanding of all exercises issued    Status Partially Met     PT LONG TERM GOAL #2   Title She will improve posture awareness  and demo awareness fo this   Status Partially Met     PT LONG TERM GOAL #3   Title She will report headaches as intermittant   Status Achieved     PT LONG TERM GOAL #4   Title She will report neck pain as intermittant   Status Achieved     PT LONG TERM GOAL #5   Title She will report pain intensity as 75% less   Status Achieved               Plan - 08/20/16 1259    Clinical Impression Statement Pt reports increased pain beginning yesterday for unknown reason. Her pain is posterior neck and bilateral upper traps. She  reports increased right shoulder pain as well. We perfromed book openings, thoracic chair extensions and yellow band rows. She requires max verbal and tactile cues to perform correctly. Trial pf ultrasound to bilateral traps with pt reporting decreased pain post.    PT Next Visit Plan ERO vs DISCHARGE/assess benefit of ultrasound and pain/ HEP review, add as needed , manual/modalites , needling if needed  possible discharge next visit or if needed 2 more visits as per 8 visits in plan      Patient will benefit from skilled therapeutic intervention in order to improve the following deficits and impairments:  Pain, Postural dysfunction, Increased muscle spasms, Decreased range of motion  Visit Diagnosis: Cervicalgia  Abnormal posture  Tension-type headache, not intractable, unspecified chronicity pattern  Cramp and spasm     Problem List Patient Active Problem List   Diagnosis Date Noted  . Neck pain 06/21/2016  . Migraine headache without aura 06/21/2016  . 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 Cervical paraspinal muscle spasm 06/09/2016  . Chest pain 04/12/2016  . Arthritis of left lower  extremity 05/29/2015  . Synovitis of knee 05/09/2015  . Fracture of first metatarsal bone 01/09/2015  . Right foot pain 12/27/2014  . Essential hypertension 12/27/2014  . Patellofemoral pain syndrome 08/12/2014  . Patellar pain 08/01/2014  . Pain of left lower extremity 08/01/2014  . Hx of migraine with aura 06/18/2013  . Elevated blood pressure reading 06/18/2013  . Bilateral anterior knee pain 09/23/2012  . Low back pain 09/23/2012  . Contraception management 03/18/2011  . Irregular menses 03/18/2011  . ELEVATED BLOOD PRESSURE WITHOUT DIAGNOSIS OF HYPERTENSION 03/02/2010  . AMENORRHEA 10/16/2008  . Hyperlipidemia 05/01/2008  . MIGRAINE HEADACHE 05/01/2008  . HEADACHE 05/01/2008  . LUMBAR STRAIN 05/01/2008    Dorene Ar, PTA 08/20/2016, 1:07 PM  [facility] [address] [place], [place], [address] Phone: [phone]   Fax:  [phone]  Name: RYLAN KAUFMANN MRN: [id number] Date of Birth: Oct 22, 1971

## 2016-08-22 ENCOUNTER — Emergency Department (HOSPITAL_COMMUNITY): Payer: 59

## 2016-08-22 ENCOUNTER — Emergency Department (HOSPITAL_COMMUNITY)
Admission: EM | Admit: 2016-08-22 | Discharge: 2016-08-22 | Disposition: A | Discharge status: Home / Self Care | Payer: 59 | Attending: Emergency Medicine | Admitting: Emergency Medicine

## 2016-08-22 DIAGNOSIS — Z791 Long term (current) use of non-steroidal anti-inflammatories (NSAID): Secondary | ICD-10-CM | POA: Diagnosis not present

## 2016-08-22 DIAGNOSIS — R1084 Generalized abdominal pain: Secondary | ICD-10-CM | POA: Diagnosis not present

## 2016-08-22 DIAGNOSIS — R109 Unspecified abdominal pain: Secondary | ICD-10-CM | POA: Diagnosis not present

## 2016-08-22 DIAGNOSIS — Z79899 Other long term (current) drug therapy: Secondary | ICD-10-CM | POA: Insufficient documentation

## 2016-08-22 DIAGNOSIS — I10 Essential (primary) hypertension: Secondary | ICD-10-CM | POA: Insufficient documentation

## 2016-08-22 LAB — URINALYSIS, ROUTINE W REFLEX MICROSCOPIC
BILIRUBIN URINE: NEGATIVE
Glucose, UA: NEGATIVE mg/dL
Hgb urine dipstick: NEGATIVE
KETONES UR: NEGATIVE mg/dL
NITRITE: NEGATIVE
Protein, ur: NEGATIVE mg/dL
Specific Gravity, Urine: 1.013 (ref 1.005–1.030)
pH: 7 (ref 5.0–8.0)

## 2016-08-22 LAB — COMPREHENSIVE METABOLIC PANEL
ALBUMIN: 3.9 g/dL (ref 3.5–5.0)
ALK PHOS: 63 U/L (ref 38–126)
ALT: 25 U/L (ref 14–54)
ANION GAP: 9 (ref 5–15)
AST: 24 U/L (ref 15–41)
BILIRUBIN TOTAL: 0.6 mg/dL (ref 0.3–1.2)
BUN: 11 mg/dL (ref 6–20)
CALCIUM: 9.6 mg/dL (ref 8.9–10.3)
CO2: 27 mmol/L (ref 22–32)
Chloride: 102 mmol/L (ref 101–111)
Creatinine, Ser: 0.62 mg/dL (ref 0.44–1.00)
GFR calc Af Amer: >60 mL/min (ref >60)
GFR calc non Af Amer: >60 mL/min (ref >60)
GLUCOSE: 93 mg/dL (ref 65–99)
Potassium: 3.8 mmol/L (ref 3.5–5.1)
Sodium: 138 mmol/L (ref 135–145)
TOTAL PROTEIN: 8.1 g/dL (ref 6.5–8.1)

## 2016-08-22 LAB — CBC WITH DIFFERENTIAL/PLATELET
BASOS PCT: 0 %
Basophils Absolute: 0 10*3/uL (ref 0.0–0.1)
EOS ABS: 0.1 10*3/uL (ref 0.0–0.7)
EOS PCT: 1 %
HCT: 40 % (ref 36.0–46.0)
Hemoglobin: 12.8 g/dL (ref 12.0–15.0)
Lymphocytes Relative: 48 %
Lymphs Abs: 2.8 10*3/uL (ref 0.7–4.0)
MCH: 27.6 pg (ref 26.0–34.0)
MCHC: 32 g/dL (ref 30.0–36.0)
MCV: 86.4 fL (ref 78.0–100.0)
MONO ABS: 0.8 10*3/uL (ref 0.1–1.0)
MONOS PCT: 13 %
NEUTROS PCT: 38 %
Neutro Abs: 2.2 10*3/uL (ref 1.7–7.7)
PLATELETS: 268 10*3/uL (ref 150–400)
RBC: 4.63 MIL/uL (ref 3.87–5.11)
RDW: 14.5 % (ref 11.5–15.5)
WBC: 5.9 10*3/uL (ref 4.0–10.5)

## 2016-08-22 LAB — URINE MICROSCOPIC-ADD ON

## 2016-08-22 LAB — PREGNANCY, URINE: PREG TEST UR: NEGATIVE

## 2016-08-22 LAB — LIPASE, BLOOD: Lipase: 26 U/L (ref 11–51)

## 2016-08-22 MED ORDER — SODIUM CHLORIDE 0.9 % IV SOLN: INTRAVENOUS | Status: DC

## 2016-08-22 MED ORDER — ONDANSETRON HCL 4 MG/2ML IJ SOLN
4.0000 mg | Freq: Once | INTRAMUSCULAR | Status: AC
  Administered 2016-08-22: 4 mg via INTRAVENOUS
  Filled 2016-08-22: qty 2

## 2016-08-22 MED ORDER — DICYCLOMINE HCL 20 MG PO TABS: 20.0000 mg | ORAL_TABLET | Freq: Two times a day (BID) | ORAL | 0 refills | Status: DC

## 2016-08-22 MED ORDER — SODIUM CHLORIDE 0.9 % IV BOLUS (SEPSIS)
1000.0000 mL | Freq: Once | INTRAVENOUS | Status: AC
  Administered 2016-08-22: 1000 mL via INTRAVENOUS

## 2016-08-22 MED ORDER — MORPHINE SULFATE (PF) 4 MG/ML IV SOLN
4.0000 mg | Freq: Once | INTRAVENOUS | Status: AC
  Administered 2016-08-22: 4 mg via INTRAVENOUS
  Filled 2016-08-22: qty 1

## 2016-08-22 MED ORDER — IOPAMIDOL (ISOVUE-300) INJECTION 61%
100.0000 mL | Freq: Once | INTRAVENOUS | Status: AC | PRN
  Administered 2016-08-22: 100 mL via INTRAVENOUS

## 2016-08-22 MED ORDER — ONDANSETRON HCL 4 MG PO TABS: 4.0000 mg | ORAL_TABLET | Freq: Four times a day (QID) | ORAL | 0 refills | Status: DC

## 2016-08-22 NOTE — ED Provider Notes (Signed)
[facility] DEPT Provider Note   CSN: [id number] Arrival date & time: 08/22/16  1020     History   Chief Complaint Chief Complaint  Patient presents with  . Abdominal Pain    pain is in the top part of abdomen and runs down into the lower part    HPI Teresa Hickman is a 45 y.o. female.  Pt here with abdominal pain that has been worsening over the past few days.  The pt has had some nausea as well.      Past Medical History:  Diagnosis Date  . Dysmenorrhea   . Essential hypertension 12/27/2014  . Hyperlipidemia   . Migraine    with some aura    Patient Active Problem List   Diagnosis Date Noted  . Neck pain 06/21/2016  . Migraine headache without aura 06/21/2016  . Cerebellar tonsillar ectopia ([place]) 06/11/2016  . Cervical paraspinal muscle spasm 06/09/2016  . Chest pain 04/12/2016  . Arthritis of left lower extremity 05/29/2015  . Synovitis of knee 05/09/2015  . Fracture of first metatarsal bone 01/09/2015  . Right foot pain 12/27/2014  . Essential hypertension 12/27/2014  . Patellofemoral pain syndrome 08/12/2014  . Patellar pain 08/01/2014  . Pain of left lower extremity 08/01/2014  . Hx of migraine with aura 06/18/2013  . Elevated blood pressure reading 06/18/2013  . Bilateral anterior knee pain 09/23/2012  . Low back pain 09/23/2012  . Contraception management 03/18/2011  . Irregular menses 03/18/2011  . ELEVATED BLOOD PRESSURE WITHOUT DIAGNOSIS OF HYPERTENSION 03/02/2010  . AMENORRHEA 10/16/2008  . Hyperlipidemia 05/01/2008  . MIGRAINE HEADACHE 05/01/2008  . HEADACHE 05/01/2008  . LUMBAR STRAIN 05/01/2008    Past Surgical History:  Procedure Laterality Date  . NO PAST SURGERIES      OB History    Gravida Para Term Preterm AB Living   4 4       4    SAB TAB Ectopic Multiple Live Births           4       Home Medications    Prior to Admission medications   Medication Sig Start Date End Date Taking? Authorizing Provider  gabapentin  (NEURONTIN) 300 MG capsule Take 1 capsule (300 mg total) by mouth 3 (three) times daily. 07/14/16  Yes Penni Bombard, MD  hydrochlorothiazide (HYDRODIURIL) 25 MG tablet Take 0.5 tablets (12.5 mg total) by mouth daily. Patient taking differently: Take 25 mg by mouth daily.  06/22/16  Yes Burnis Medin, MD  ibuprofen (ADVIL,MOTRIN) 600 MG tablet Take 600 mg by mouth every 8 (eight) hours as needed for mild pain.   Yes Historical Provider, MD  omeprazole (PRILOSEC) 20 MG capsule Take 1 capsule (20 mg total) by mouth daily. Patient taking differently: Take 20 mg by mouth daily as needed (heart burn).  04/14/15  Yes Jola Schmidt, MD  atorvastatin (LIPITOR) 20 MG tablet Take 1 tablet (20 mg total) by mouth daily. Patient not taking: Reported on 08/22/2016 04/20/16   Burnis Medin, MD  dicyclomine (BENTYL) 20 MG tablet Take 1 tablet (20 mg total) by mouth 2 (two) times daily. 08/22/16   Isla Pence, MD  ondansetron (ZOFRAN) 4 MG tablet Take 1 tablet (4 mg total) by mouth every 6 (six) hours. 08/22/16   Isla Pence, MD  tiZANidine (ZANAFLEX) 4 MG tablet Take 1 tablet (4 mg total) by mouth Nightly. Patient not taking: Reported on 07/14/2016 06/09/16   Lyndal Pulley, DO    Family  History Family History  Problem Relation Age of Onset  . Hypertension Mother   . Diabetes Mother   . Stroke Mother     4s SD  13    Social History Social History  Substance Use Topics  . Smoking status: Never Smoker  . Smokeless tobacco: Not on file  . Alcohol use No     Allergies   Review of patient's allergies indicates no known allergies.   Review of Systems Review of Systems  Gastrointestinal: Positive for abdominal pain and nausea.  All other systems reviewed and are negative.    Physical Exam Updated Vital Signs BP 143/87 (BP Location: Left Arm)   Pulse 88   Temp 98.1 F (36.7 C) (Oral)   Resp 18   Ht 5\' 6"  (1.676 m)   Wt 195 lb (88.5 kg)   LMP 07/18/2016   SpO2 100%   BMI 31.47 kg/m     Physical Exam  Constitutional: She is oriented to person, place, and time. She appears well-developed and well-nourished.  HENT:  Head: Normocephalic and atraumatic.  Right Ear: External ear normal.  Left Ear: External ear normal.  Nose: Nose normal.  Mouth/Throat: Oropharynx is clear and moist.  Eyes: Conjunctivae and EOM are normal. Pupils are equal, round, and reactive to light.  Neck: Normal range of motion. Neck supple.  Cardiovascular: Normal rate, regular rhythm, normal heart sounds and intact distal pulses.   Pulmonary/Chest: Effort normal and breath sounds normal.  Abdominal: Soft. There is tenderness in the right lower quadrant, suprapubic area and left lower quadrant.  Musculoskeletal: Normal range of motion.  Neurological: She is alert and oriented to person, place, and time.  Skin: Skin is warm and dry.  Psychiatric: She has a normal mood and affect. Her behavior is normal. Judgment and thought content normal.  Nursing note and vitals reviewed.    ED Treatments / Results  Labs (all labs ordered are listed, but only abnormal results are displayed) Labs Reviewed  URINALYSIS, ROUTINE W REFLEX MICROSCOPIC (NOT AT [facility]) - Abnormal; Notable for the following:       Result Value   Leukocytes, UA TRACE (*)    All other components within normal limits  URINE MICROSCOPIC-ADD ON - Abnormal; Notable for the following:    Squamous Epithelial / LPF 6-30 (*)    Bacteria, UA RARE (*)    All other components within normal limits  COMPREHENSIVE METABOLIC PANEL  LIPASE, BLOOD  CBC WITH DIFFERENTIAL/PLATELET  PREGNANCY, URINE    EKG  EKG Interpretation None       Radiology Ct Abdomen Pelvis W Contrast  Result Date: 08/22/2016 CLINICAL DATA:  Acute central abdominal pain.  Vomiting. EXAM: CT ABDOMEN AND PELVIS WITH CONTRAST TECHNIQUE: Multidetector CT imaging of the abdomen and pelvis was performed using the standard protocol following bolus administration of intravenous  contrast. CONTRAST:  174mL ISOVUE-300 IOPAMIDOL (ISOVUE-300) INJECTION 61% COMPARISON:  None. FINDINGS: Lower chest: Visualized lung bases are unremarkable. Hepatobiliary: No gallstones are noted.  The liver appears normal. Pancreas: Normal. Spleen: Normal. Adrenals/Urinary Tract: Adrenal glands and kidneys appear normal. No hydronephrosis or renal obstruction is noted. No renal or ureteral calculi are noted. Urinary bladder appears normal. Stomach/Bowel: The appendix appears normal. There is no evidence of bowel obstruction. Vascular/Lymphatic: No significant adenopathy is noted. Reproductive: Uterus and ovaries are unremarkable. Other: No abnormal fluid collection is noted. Musculoskeletal: No significant osseous abnormality is noted. IMPRESSION: No definite abnormality seen in the abdomen or pelvis. Electronically Signed  By: Marijo Conception, M.D.   On: 08/22/2016 13:27    Procedures Procedures (including critical care time)  Medications Ordered in ED Medications  sodium chloride 0.9 % bolus 1,000 mL (1,000 mLs Intravenous New Bag/Given 08/22/16 1158)    And  0.9 %  sodium chloride infusion (not administered)  morphine 4 MG/ML injection 4 mg (4 mg Intravenous Given 08/22/16 1158)  ondansetron (ZOFRAN) injection 4 mg (4 mg Intravenous Given 08/22/16 1158)  iopamidol (ISOVUE-300) 61 % injection 100 mL (100 mLs Intravenous Contrast Given 08/22/16 1257)     Initial Impression / Assessment and Plan / ED Course  I have reviewed the triage vital signs and the nursing notes.  Pertinent labs & imaging results that were available during my care of the patient were reviewed by me and considered in my medical decision making (see chart for details).  Clinical Course    Pt is feeling better.  Her labs are nl and her ct scan was normal.  She will be d/c'd with zofran and bentyl and is told to return if worse.  Final Clinical Impressions(s) / ED Diagnoses   Final diagnoses:  Generalized abdominal pain     New Prescriptions New Prescriptions   DICYCLOMINE (BENTYL) 20 MG TABLET    Take 1 tablet (20 mg total) by mouth 2 (two) times daily.   ONDANSETRON (ZOFRAN) 4 MG TABLET    Take 1 tablet (4 mg total) by mouth every 6 (six) hours.     Isla Pence, MD 08/22/16 1350

## 2016-08-22 NOTE — ED Notes (Signed)
Per previous tech, pt made aware of need for urine sample

## 2016-08-22 NOTE — ED Notes (Signed)
Bed: [id number] Expected date:  Expected time:  Means of arrival:  Comments: [place]- abd pain

## 2016-08-23 ENCOUNTER — Ambulatory Visit: Payer: 59

## 2016-08-23 DIAGNOSIS — M542 Cervicalgia: Secondary | ICD-10-CM

## 2016-08-23 DIAGNOSIS — G44209 Tension-type headache, unspecified, not intractable: Secondary | ICD-10-CM | POA: Diagnosis not present

## 2016-08-23 DIAGNOSIS — R252 Cramp and spasm: Secondary | ICD-10-CM

## 2016-08-23 DIAGNOSIS — R293 Abnormal posture: Secondary | ICD-10-CM

## 2016-08-23 NOTE — Therapy (Addendum)
[place], [place], [address] Phone: [phone]   Fax:  [phone]  Physical Therapy Treatment / Discharge Note  Patient Details  Name: Teresa Hickman MRN: [id number] Date of Birth: Feb 10, 1971 Referring Provider: Kary Kos , MD  Encounter Date: 08/23/2016      PT End of Session - 08/23/16 1150    Visit Number 8   Number of Visits 8   Date for PT Re-Evaluation 08/19/16   PT Start Time 1025   PT Stop Time 1210   PT Time Calculation (min) 25 min   Activity Tolerance Patient tolerated treatment well   Behavior During Therapy [facility] for tasks assessed/performed      Past Medical History:  Diagnosis Date  . Dysmenorrhea   . Essential hypertension 12/27/2014  . Hyperlipidemia   . Migraine    with some aura    Past Surgical History:  Procedure Laterality Date  . NO PAST SURGERIES      There were no vitals filed for this visit.      Subjective Assessment - 08/23/16 1153    Subjective No pain today. Doing well since last visit   Currently in Pain? No/denies            [facility] PT Assessment - 08/23/16 0001      AROM   Cervical Flexion 70   Cervical Extension 50   Cervical - Right Side Bend 60   Cervical - Left Side Bend 60   Cervical - Right Rotation 75   Cervical - Left Rotation 75     Strength   Overall Strength Comments Normal      We address HEP and posture awareness and HEP to correct into good posture. She does not  do exercises perfectly but well enough to continue HEP and gain benefit                           PT Long Term Goals - 08/23/16 1153      PT LONG TERM GOAL #1   Title She will demo understanding of all exercises issued    Baseline Not perfect but able to do   Status Achieved     PT LONG TERM GOAL #2   Title She will improve posture awareness  and demo awareness fo this   Status Achieved     PT LONG TERM GOAL #3   Title She will report  headaches as intermittant   Baseline 1 per week and occipital   Status Achieved     PT LONG TERM GOAL #4   Title She will report neck pain as intermittant   Status Achieved     PT LONG TERM GOAL #5   Title She will report pain intensity as 75% less   Status Achieved               Plan - 08/23/16 1207    Clinical Impression Statement Ms Frommelt reports that after last session she has not had a headache and lately she has not had but 1x/week headache and only in occipital area. She is ready for discharge as she is much improved and is able to correct posture and do HEP though  not perfect . She     PT Treatment/Interventions Cryotherapy;Electrical Stimulation;Moist Heat;Traction;Ultrasound;Passive range of motion;Patient/family education;Manual techniques;Taping;Dry needling;Therapeutic exercise   PT Next Visit Plan Discharge with HEP   Consulted and Agree with Plan of Care Patient  Patient will benefit from skilled therapeutic intervention in order to improve the following deficits and impairments:  Pain, Postural dysfunction, Increased muscle spasms, Decreased range of motion  Visit Diagnosis: Cervicalgia  Abnormal posture  Tension-type headache, not intractable, unspecified chronicity pattern  Cramp and spasm     Problem List Patient Active Problem List   Diagnosis Date Noted  . Neck pain 06/21/2016  . Migraine headache without aura 06/21/2016  . 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 Cervical paraspinal muscle spasm 06/09/2016  . Chest pain 04/12/2016  . Arthritis of left lower extremity 05/29/2015  . Synovitis of knee 05/09/2015  . Fracture of first metatarsal bone 01/09/2015  . Right foot pain 12/27/2014  . Essential hypertension 12/27/2014  . Patellofemoral pain syndrome 08/12/2014  . Patellar pain 08/01/2014  . Pain of left lower extremity 08/01/2014  . Hx of migraine with aura 06/18/2013  . Elevated blood pressure reading 06/18/2013  .  Bilateral anterior knee pain 09/23/2012  . Low back pain 09/23/2012  . Contraception management 03/18/2011  . Irregular menses 03/18/2011  . ELEVATED BLOOD PRESSURE WITHOUT DIAGNOSIS OF HYPERTENSION 03/02/2010  . AMENORRHEA 10/16/2008  . Hyperlipidemia 05/01/2008  . MIGRAINE HEADACHE 05/01/2008  . HEADACHE 05/01/2008  . LUMBAR STRAIN 05/01/2008    Darrel Hoover  PT 08/23/2016, 12:14 PM  [facility] [address] [place], [place], [address] Phone: [phone]   Fax:  [phone]  Name: NATIA FAHMY MRN: [id number] Date of Birth: 1971-11-08   PHYSICAL THERAPY DISCHARGE SUMMARY  Visits from Start of Care: 8 Current functional level related to goals / functional outcomes: See above   Remaining deficits: See above   Education / Equipment: HEP  Plan: Patient agrees to discharge.  Patient goals were met. Patient is being discharged due to meeting the stated rehab goals.  ?????

## 2016-08-24 ENCOUNTER — Ambulatory Visit (INDEPENDENT_AMBULATORY_CARE_PROVIDER_SITE_OTHER): Payer: 59 | Admitting: Internal Medicine

## 2016-08-24 ENCOUNTER — Encounter: Payer: Self-pay | Admitting: Internal Medicine

## 2016-08-24 ENCOUNTER — Telehealth: Payer: Self-pay | Admitting: Internal Medicine

## 2016-08-24 VITALS — BP 150/98 | Temp 98.1°F | Wt 189.0 lb

## 2016-08-24 DIAGNOSIS — N910 Primary amenorrhea: Secondary | ICD-10-CM

## 2016-08-24 DIAGNOSIS — R109 Unspecified abdominal pain: Secondary | ICD-10-CM

## 2016-08-24 DIAGNOSIS — N926 Irregular menstruation, unspecified: Secondary | ICD-10-CM

## 2016-08-24 NOTE — Progress Notes (Signed)
Pre visit review using our clinic review tool, if applicable. No additional management support is needed unless otherwise documented below in the visit note.  Chief Complaint  Patient presents with  . Follow-up    HPI: Teresa Hickman 45 y.o.  Send in by team health as acute visit     But was seen in  Ed for same problem  9 10 and had lab and ct scan   Unrevealing  ands was given zofran  and bentyl to take  Onset   When sitting in the evening with nausea and vomited one time and developed abdominal pain   Took tums later     No help and   Went to ed cause of pain was becoming severe  Mid lower and radiating to back  eval negative .     But last night hard to sleep  And abd and lower back  Period  Is 6-7 days  Late    Down and back and there  Using  Calendar method for birth control    Has some constipation with the gabapentin no blood  And no uti sx  Does take some ibuprofen  For neck ha   Lavonne Chick 1 hour ago (10:44 AM)    [facility] - Client [facility] Patient Name: Teresa Hickman DOB: November 01, 1971 Initial Comment Caller states having abd pain; Nurse Assessment Nurse: Dimas Chyle, RN, Dellis Filbert Date/Time (Eastern Time): 08/24/2016 10:35:14 AM Confirm and document reason for call. If symptomatic, describe symptoms. You must click the next button to save text entered. ---Caller states having abdominal pain. Seen in ED on Saturday. No fever. No vomiting or diarrhea. Has the patient traveled out of the country within the last 30 days? ---No Does the patient have any new or worsening symptoms? ---Yes Will a triage be completed? ---Yes Related visit to physician within the last 2 weeks? ---No Does the PT have any chronic conditions? (i.e. diabetes, asthma, etc.) ---Yes List chronic conditions. ---HTN Is the patient pregnant or possibly pregnant? (Ask all females between the ages of 8-55) ---No Is this a  behavioral health or substance abuse call? ---No Guidelines Guideline Title Affirmed Question Affirmed Notes Abdominal Pain - Female [1] MILD-MODERATE pain AND [2] constant AND [3] present > 2 hours Final Disposition User See Physician within 4 Hours (or PCP triage) Dimas Chyle, RN, Dellis Filbert Comments Caller could not be seen in the next 4 hours due to having to get child from school so next available appointment with PCP was scheduled at 3:45. Referrals REFERRED TO PCP OFFICE Disagree/Comply: Comply   Documentation     ROS: See pertinent positives and negatives per HPI.  Past Medical History:  Diagnosis Date  . Dysmenorrhea   . Essential hypertension 12/27/2014  . Hyperlipidemia   . Migraine    with some aura    Family History  Problem Relation Age of Onset  . Hypertension Mother   . Diabetes Mother   . Stroke Mother     27s SD  77    Social History   Social History  . Marital status: Married    Spouse name: Lawal  . Number of children: 4  . Years of education: 16   Occupational History  . Unemployed    Social History Main Topics  . Smoking status: Never Smoker  . Smokeless tobacco: None  . Alcohol use No  . Drug use: No  . Sexual activity: No  Other Topics Concern  . None   Social History Narrative   Lives with husband and son, [name]   From [place]   Medical Family   HH of 6    No pets   G4 p4    Caffeine use: 1 cup coffee per day    Outpatient Medications Prior to Visit  Medication Sig Dispense Refill  . atorvastatin (LIPITOR) 20 MG tablet Take 1 tablet (20 mg total) by mouth daily. 90 tablet 1  . dicyclomine (BENTYL) 20 MG tablet Take 1 tablet (20 mg total) by mouth 2 (two) times daily. 20 tablet 0  . gabapentin (NEURONTIN) 300 MG capsule Take 1 capsule (300 mg total) by mouth 3 (three) times daily. 90 capsule 12  . hydrochlorothiazide (HYDRODIURIL) 25 MG tablet Take 0.5 tablets (12.5 mg total) by mouth daily. (Patient taking differently: Take  25 mg by mouth daily. ) 45 tablet 1  . ibuprofen (ADVIL,MOTRIN) 600 MG tablet Take 600 mg by mouth every 8 (eight) hours as needed for mild pain.    Marland Kitchen omeprazole (PRILOSEC) 20 MG capsule Take 1 capsule (20 mg total) by mouth daily. (Patient taking differently: Take 20 mg by mouth daily as needed (heart burn). ) 30 capsule 0  . ondansetron (ZOFRAN) 4 MG tablet Take 1 tablet (4 mg total) by mouth every 6 (six) hours. 12 tablet 0  . tiZANidine (ZANAFLEX) 4 MG tablet Take 1 tablet (4 mg total) by mouth Nightly. 30 tablet 2   No facility-administered medications prior to visit.      EXAM:  BP (!) 150/98 (BP Location: Left Arm, Patient Position: Sitting, Cuff Size: Normal)   Temp 98.1 F (36.7 C) (Oral)   Wt 189 lb (85.7 kg)   LMP 07/18/2016   BMI 30.51 kg/m   Body mass index is 30.51 kg/m.  GENERAL: vitals reviewed and listed above, alert, oriented, appears well hydrated and in no acute distress HEENT: atraumatic, conjunctiva  clear, no obvious abnormalities on inspection of external nose and ears OP : no lesion edema or exudate  NECK: no obvious masses on inspection palpation  LUNGS: clear to auscultation bilaterally, no wheezes, rales or rhonchi, good air movement CV: HRRR, no clubbing cyanosis or  peripheral edema nl cap refill  Abdomen:  Sof,t normal bowel sounds without hepatosplenomegaly, no guarding rebound or masses no CVA tenderness  no rebouind points to area mid and lower  Neg psoas  MS: moves all extremities without noticeable focal  abnormality PSYCH: pleasant and cooperative, no obvious depression or anxiety  ASSESSMENT AND PLAN:  Discussed the following assessment and plan:  Abdominal pain, unspecified abdominal location - Plan: HCG, Quant, Pregnancy, CANCELED: HCG, Quant, Pregnancy  Late menses - Plan: CANCELED: HCG, Quant, Pregnancy Uncertain cause check beta hCG treat constipation monitor follow-up. Evaluation from emergency room reviewed. -Patient advised to  return or notify health care team  if symptoms worsen ,persist or new concerns arise.  Patient Instructions  checking blood tests for   Pregnancy  Just in case .  Related  This could be a stomach viral gastritis in which case should be better in another  3-4 days .   For constipation  Can add miralax  Every day until better .  1 capful .   If not getting better in another  4-5 days or worse let us know and we will reevaluate.     Standley Brooking. Panosh M.D.  [facility] DEPT Provider Note   CSN: [id number] Arrival date & time:  08/22/16  1020     History                        Chief Complaint     Chief Complaint  Patient presents with  . Abdominal Pain    pain is in the top part of abdomen and runs down into the lower part    HPI Teresa Hickman is a 45 y.o. female.  Pt here with abdominal pain that has been worsening over the past few days.  The pt has had some nausea as well.        Past Medical History:  Diagnosis Date  . Dysmenorrhea   . Essential hypertension 12/27/2014  . Hyperlipidemia   . Migraine    with some aura        Patient Active Problem List   Diagnosis Date Noted  . Neck pain 06/21/2016  . Migraine headache without aura 06/21/2016  . 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 Cervical paraspinal muscle spasm 06/09/2016  . Chest pain 04/12/2016  . Arthritis of left lower extremity 05/29/2015  . Synovitis of knee 05/09/2015  . Fracture of first metatarsal bone 01/09/2015  . Right foot pain 12/27/2014  . Essential hypertension 12/27/2014  . Patellofemoral pain syndrome 08/12/2014  . Patellar pain 08/01/2014  . Pain of left lower extremity 08/01/2014  . Hx of migraine with aura 06/18/2013  . Elevated blood pressure reading 06/18/2013  . Bilateral anterior knee pain 09/23/2012  . Low back pain 09/23/2012  . Contraception management 03/18/2011  . Irregular menses 03/18/2011  . ELEVATED BLOOD PRESSURE WITHOUT DIAGNOSIS  OF HYPERTENSION 03/02/2010  . AMENORRHEA 10/16/2008  . Hyperlipidemia 05/01/2008  . MIGRAINE HEADACHE 05/01/2008  . HEADACHE 05/01/2008  . LUMBAR STRAIN 05/01/2008         Past Surgical History:  Procedure Laterality Date  . NO PAST SURGERIES              OB History    Gravida Para Term Preterm AB Living   4 4       4    SAB TAB Ectopic Multiple Live Births           4       Home Medications                                Prior to Admission medications   Medication Sig Start Date End Date Taking? Authorizing Provider  gabapentin (NEURONTIN) 300 MG capsule Take 1 capsule (300 mg total) by mouth 3 (three) times daily. 07/14/16  Yes Penni Bombard, MD  hydrochlorothiazide (HYDRODIURIL) 25 MG tablet Take 0.5 tablets (12.5 mg total) by mouth daily. Patient taking differently: Take 25 mg by mouth daily.  06/22/16  Yes Burnis Medin, MD  ibuprofen (ADVIL,MOTRIN) 600 MG tablet Take 600 mg by mouth every 8 (eight) hours as needed for mild pain.   Yes Historical Provider, MD  omeprazole (PRILOSEC) 20 MG capsule Take 1 capsule (20 mg total) by mouth daily. Patient taking differently: Take 20 mg by mouth daily as needed (heart burn).  04/14/15  Yes Jola Schmidt, MD  atorvastatin (LIPITOR) 20 MG tablet Take 1 tablet (20 mg total) by mouth daily. Patient not taking: Reported on 08/22/2016 04/20/16   Burnis Medin, MD  dicyclomine (BENTYL) 20 MG tablet Take 1 tablet (20 mg total) by mouth 2 (two) times daily. 08/22/16  Isla Pence, MD  ondansetron (ZOFRAN) 4 MG tablet Take 1 tablet (4 mg total) by mouth every 6 (six) hours. 08/22/16   Isla Pence, MD  tiZANidine (ZANAFLEX) 4 MG tablet Take 1 tablet (4 mg total) by mouth Nightly. Patient not taking: Reported on 07/14/2016 06/09/16   Lyndal Pulley, DO    Family History       Family History  Problem Relation Age of Onset  . Hypertension Mother   . Diabetes Mother   . Stroke Mother     59s SD  55      Social History     Social History  Substance Use Topics  . Smoking status: Never Smoker  . Smokeless tobacco: Not on file  . Alcohol use No     Allergies                     Review of patient's allergies indicates no known allergies.   Review of Systems Review of Systems  Gastrointestinal: Positive for abdominal pain and nausea.  All other systems reviewed and are negative.    Physical Exam Updated Vital Signs BP 143/87 (BP Location: Left Arm)   Pulse 88   Temp 98.1 F (36.7 C) (Oral)   Resp 18   Ht 5\' 6"  (1.676 m)   Wt 195 lb (88.5 kg)   LMP 07/18/2016   SpO2 100%   BMI 31.47 kg/m   Physical Exam  Constitutional: She is oriented to person, place, and time. She appears well-developed and well-nourished.  HENT:  Head: Normocephalic and atraumatic.  Right Ear: External ear normal.  Left Ear: External ear normal.  Nose: Nose normal.  Mouth/Throat: Oropharynx is clear and moist.  Eyes: Conjunctivae and EOM are normal. Pupils are equal, round, and reactive to light.  Neck: Normal range of motion. Neck supple.  Cardiovascular: Normal rate, regular rhythm, normal heart sounds and intact distal pulses.   Pulmonary/Chest: Effort normal and breath sounds normal.  Abdominal: Soft. There is tenderness in the right lower quadrant, suprapubic area and left lower quadrant.  Musculoskeletal: Normal range of motion.  Neurological: She is alert and oriented to person, place, and time.  Skin: Skin is warm and dry.  Psychiatric: She has a normal mood and affect. Her behavior is normal. Judgment and thought content normal.  Nursing note and vitals reviewed.    ED Treatments / Results  Labs (all labs ordered are listed, but only abnormal results are displayed)      Labs Reviewed  URINALYSIS, ROUTINE W REFLEX MICROSCOPIC (NOT AT [facility]) - Abnormal; Notable for the following:       Result Value    Leukocytes, UA TRACE (*)    All other components within  normal limits  URINE MICROSCOPIC-ADD ON - Abnormal; Notable for the following:    Squamous Epithelial / LPF 6-30 (*)    Bacteria, UA RARE (*)    All other components within normal limits  COMPREHENSIVE METABOLIC PANEL  LIPASE, BLOOD  CBC WITH DIFFERENTIAL/PLATELET  PREGNANCY, URINE    EKG      EKG Interpretation None      Radiology  ImagingResults(Last48hours)  Ct Abdomen Pelvis W Contrast  Result Date: 08/22/2016 CLINICAL DATA:  Acute central abdominal pain.  Vomiting. EXAM: CT ABDOMEN AND PELVIS WITH CONTRAST TECHNIQUE: Multidetector CT imaging of the abdomen and pelvis was performed using the standard protocol following bolus administration of intravenous contrast. CONTRAST:  173mL ISOVUE-300 IOPAMIDOL (ISOVUE-300) INJECTION 61% COMPARISON:  None. FINDINGS:  Lower chest: Visualized lung bases are unremarkable. Hepatobiliary: No gallstones are noted.  The liver appears normal. Pancreas: Normal. Spleen: Normal. Adrenals/Urinary Tract: Adrenal glands and kidneys appear normal. No hydronephrosis or renal obstruction is noted. No renal or ureteral calculi are noted. Urinary bladder appears normal. Stomach/Bowel: The appendix appears normal. There is no evidence of bowel obstruction. Vascular/Lymphatic: No significant adenopathy is noted. Reproductive: Uterus and ovaries are unremarkable. Other: No abnormal fluid collection is noted. Musculoskeletal: No significant osseous abnormality is noted. IMPRESSION: No definite abnormality seen in the abdomen or pelvis. Electronically Signed   By: Marijo Conception, M.D.   On: 08/22/2016 13:27     Procedures Procedures (including critical care time)  Medications Ordered in ED Medications  sodium chloride 0.9 % bolus 1,000 mL (1,000 mLs Intravenous New Bag/Given 08/22/16 1158)    And  0.9 %  sodium chloride infusion (not administered)  morphine 4 MG/ML injection 4 mg (4 mg Intravenous Given 08/22/16 1158)  ondansetron (ZOFRAN)  injection 4 mg (4 mg Intravenous Given 08/22/16 1158)  iopamidol (ISOVUE-300) 61 % injection 100 mL (100 mLs Intravenous Contrast Given 08/22/16 1257)     Initial Impression / Assessment and Plan / ED Course  I have reviewed the triage vital signs and the nursing notes.  Pertinent labs & imaging results that were available during my care of the patient were reviewed by me and considered in my medical decision making (see chart for details).  Clinical Course    Pt is feeling better.  Her labs are nl and her ct scan was normal.  She will be d/c'd with zofran and bentyl and is told to return if worse.  Final Clinical Impressions(s) / ED Diagnoses   Final diagnoses:  Generalized abdominal pain    New Prescriptions     New Prescriptions   DICYCLOMINE (BENTYL) 20 MG TABLET    Take 1 tablet (20 mg total) by mouth 2 (two) times daily.   ONDANSETRON (ZOFRAN) 4 MG TABLET    Take 1 tablet (4 mg total) by mouth every 6 (six) hours.     Isla Pence, MD 08/22/16 1350     Other Notes  All notes     ED Notes from [place], NT     ED Notes from Newt Lukes, RN (Emergency Medicine)  Additional Orders and Documentation      Results  Imaging      Meds        Orders        Flowsheets     Encounter Info:   History,   Allergies,   Detailed Report     Communications   Media   Electronic signature on 08/22/2016 1:59 PM   Document on 08/22/2016 1:50 PM by Isla Pence, MD : ED PB Summary   Document on 08/22/2016 1:50 PM by Isla Pence, MD : ED Encounter Summary   Electronic signature on 08/22/2016 11:32 AM   Clinical Impressions      Generalized abdominal pain  Disposition      Discharge         ED After Visit Summary (Printed 08/22/2016)        Follow-Ups: Schedule an appointment with Lottie Dawson, MD (Internal Medicine) in 2 days (08/24/2016); As needed  Medication Changes      Dicyclomine HCl 20 mg Oral 2 times  daily     Ondansetron HCl 4 mg Oral Every 6 hours    Medication List at Discharge  Care Timeline  1020  Arrived  1045  Comprehensive metabolic panel    Lipase, blood    CBC WITH DIFFERENTIAL  1158  Sodium Chloride 1000 mL    Morphine Sulfate 4 mg    Ondansetron HCl 4 mg  1200  Urinalysis, Routine w reflex microscopic (not at [facility])     Pregnancy, urine    Urine microscopic-add on   1257  Iopamidol 100 mL  1311  CT Abdomen Pelvis W Contrast  1402  Discharged

## 2016-08-24 NOTE — Telephone Encounter (Signed)
Noted  

## 2016-08-24 NOTE — Patient Instructions (Signed)
checking blood tests for   Pregnancy  Just in case .  Related  This could be a stomach viral gastritis in which case should be better in another  3-4 days .   For constipation  Can add miralax  Every day until better .  1 capful .   If not getting better in another  4-5 days or worse let us know and we will reevaluate.

## 2016-08-24 NOTE — Telephone Encounter (Signed)
[facility] - Client [place] [facility] Patient Name: Teresa Hickman DOB: 1971-07-12 Initial Comment Caller states having abd pain; Nurse Assessment Nurse: Dimas Chyle, RN, Dellis Filbert Date/Time (Eastern Time): 08/24/2016 10:35:14 AM Confirm and document reason for call. If symptomatic, describe symptoms. You must click the next button to save text entered. ---Caller states having abdominal pain. Seen in ED on Saturday. No fever. No vomiting or diarrhea. Has the patient traveled out of the country within the last 30 days? ---No Does the patient have any new or worsening symptoms? ---Yes Will a triage be completed? ---Yes Related visit to physician within the last 2 weeks? ---No Does the PT have any chronic conditions? (i.e. diabetes, asthma, etc.) ---Yes List chronic conditions. ---HTN Is the patient pregnant or possibly pregnant? (Ask all females between the ages of 79-55) ---No Is this a behavioral health or substance abuse call? ---No Guidelines Guideline Title Affirmed Question Affirmed Notes Abdominal Pain - Female [1] MILD-MODERATE pain AND [2] constant AND [3] present > 2 hours Final Disposition User See Physician within 4 Hours (or PCP triage) Dimas Chyle, RN, Dellis Filbert Comments Caller could not be seen in the next 4 hours due to having to get child from school so next available appointment with PCP was scheduled at 3:45. Referrals REFERRED TO PCP OFFICE Disagree/Comply: Comply

## 2016-08-25 LAB — HCG, QUANTITATIVE, PREGNANCY

## 2016-08-29 ENCOUNTER — Other Ambulatory Visit: Payer: 59

## 2016-10-13 ENCOUNTER — Other Ambulatory Visit: Payer: Self-pay | Admitting: Internal Medicine

## 2016-10-15 NOTE — Telephone Encounter (Signed)
Looks like pt is now taking 1 tablet daily.  Ok to change directions?  Due for BMP 3/18.

## 2016-10-18 NOTE — Telephone Encounter (Signed)
Ok to change sig and refill  Had a chemistry done in hosp  On [place] to refill for 6 months    Have her make  cpx appt    In 4-6 months

## 2016-10-19 ENCOUNTER — Ambulatory Visit: Payer: 59 | Admitting: Diagnostic Neuroimaging

## 2016-10-19 NOTE — Telephone Encounter (Signed)
Sent to the pharmacy by e-scribe. 

## 2016-11-09 DIAGNOSIS — H52223 Regular astigmatism, bilateral: Secondary | ICD-10-CM | POA: Diagnosis not present

## 2016-11-09 DIAGNOSIS — H524 Presbyopia: Secondary | ICD-10-CM | POA: Diagnosis not present

## 2016-12-20 NOTE — Progress Notes (Signed)
Pre visit review using our clinic review tool, if applicable. No additional management support is needed unless otherwise documented below in the visit note.  Chief Complaint  Patient presents with  . Menstrual Problem    Severe cramping during menstrual cycle and bleeding after coitus.    HPI: Teresa Hickman 46 y.o.  sda   Just moticed having post coital bleeding  In past  1-2 months and then periods  Are  ireg  And back   Gone  lmp 2 weeks  Painful 5-6 days    nd then again  .   Gone .   No   Birth controil.for years?    4 preg  First first trimester miscarriage last preg 9 years ago .  No other partners   Cramping lower pelvic  More right than left and to back at times  ROS: See pertinent positives and negatives per HPI. No cp sob  Has are better .  bp usually ok up in office  No uti sx vag sx     Past Medical History:  Diagnosis Date  . Dysmenorrhea   . Essential hypertension 12/27/2014  . Hyperlipidemia   . Migraine    with some aura    Family History  Problem Relation Age of Onset  . Hypertension Mother   . Diabetes Mother   . Stroke Mother     79s SD  28    Social History   Social History  . Marital status: Married    Spouse name: Lawal  . Number of children: 4  . Years of education: 16   Occupational History  . Unemployed    Social History Main Topics  . Smoking status: Never Smoker  . Smokeless tobacco: Never Used  . Alcohol use No  . Drug use: No  . Sexual activity: No   Other Topics Concern  . None   Social History Narrative   Lives with husband and son, [name]   From [place]   Medical Family   HH of 6    No pets   G4 p4    Caffeine use: 1 cup coffee per day    Outpatient Medications Prior to Visit  Medication Sig Dispense Refill  . atorvastatin (LIPITOR) 20 MG tablet Take 1 tablet (20 mg total) by mouth daily. 90 tablet 1  . hydrochlorothiazide (HYDRODIURIL) 25 MG tablet Take 1 tablet (25 mg total) by mouth daily. 90 tablet 0  .  ibuprofen (ADVIL,MOTRIN) 600 MG tablet Take 600 mg by mouth every 8 (eight) hours as needed for mild pain.    Marland Kitchen dicyclomine (BENTYL) 20 MG tablet Take 1 tablet (20 mg total) by mouth 2 (two) times daily. (Patient not taking: Reported on 12/21/2016) 20 tablet 0  . omeprazole (PRILOSEC) 20 MG capsule Take 1 capsule (20 mg total) by mouth daily. (Patient not taking: Reported on 12/21/2016) 30 capsule 0  . gabapentin (NEURONTIN) 300 MG capsule Take 1 capsule (300 mg total) by mouth 3 (three) times daily. 90 capsule 12  . ondansetron (ZOFRAN) 4 MG tablet Take 1 tablet (4 mg total) by mouth every 6 (six) hours. 12 tablet 0  . tiZANidine (ZANAFLEX) 4 MG tablet Take 1 tablet (4 mg total) by mouth Nightly. 30 tablet 2   No facility-administered medications prior to visit.      EXAM:  BP (!) 156/84 (BP Location: Right Arm, Patient Position: Sitting, Cuff Size: Normal)   Temp 98.2 F (36.8 C) (Oral)   Wt 186  lb (84.4 kg)   LMP 12/06/2016 (Approximate)   BMI 30.02 kg/m   Body mass index is 30.02 kg/m.  GENERAL: vitals reviewed and listed above, alert, oriented, appears well hydrated and in no acute distress HEENT: atraumatic, conjunctiva  clear, no obvious abnormalities on inspection of external nose and ears  NECK: no obvious masses on inspection palpation  CV: HRRR, no clubbing cyanosis or  peripheral edema nl cap refill  Abdomen:  Sof,t normal bowel sounds without hepatosplenomegaly, no guarding rebound or masses no CVA tenderness  Points to right pelvic area as area of discomfort but no g r . Pelvic deferred because going to do gyne referral  Skin non bruising or bleeding MS: moves all extremities without noticeable focal  abnormality PSYCH: pleasant and cooperative, no obvious depression or anxiety  ASSESSMENT AND PLAN:  Discussed the following assessment and plan:  Menstrual cramps - new onset months with irreg bleeding and no contracetion  but neg hx ectopic  - Plan: CBC with  Differential/Platelet, HCG, Quant, Pregnancy, POCT Urinalysis Dipstick (Automated), Sedimentation rate, Ambulatory referral to Gynecology  Irregular menstrual cycle - Plan: CBC with Differential/Platelet, HCG, Quant, Pregnancy, Sedimentation rate, HIV antibody, Ambulatory referral to Gynecology  PCB (post coital bleeding) - Plan: CBC with Differential/Platelet, HCG, Quant, Pregnancy, POCT Urinalysis Dipstick (Automated), Sedimentation rate, Ambulatory referral to Gynecology  Screening for HIV (human immunodeficiency virus) - Plan: HIV antibody  Leukocytes in urine - Plan: Urine culture Change in periods and pain with irreg bleeding and post coital bleeding off and on for 1-2 months?  No sx of infection  Associated .  Check b hcg  And lab  Refer to gyne  Exam benign today  -Patient advised to return or notify health care team  if symptoms worsen ,persist or new concerns arise.  Patient Instructions  Blood work today will let you know the results. We'll arrange a gynecology referral consult.and you will be contacted  Sometimes she can get irregular bleeding in the perimenopausal age but needs to be evaluated to make sure this is not abnormal. You'll be contacted about appointment even if your periods become more normal before you get seen at 1 she did get evaluated. Last Pap smear was done in 2014 and was normal and had negative high risk hpv  Cotesting.   Okay to take ibuprofen   For pain .     Standley Brooking. Panosh M.D.  hcg neg ua + leuk sent for ucx  Esr elevated 74 cbc nl.

## 2016-12-21 ENCOUNTER — Ambulatory Visit (INDEPENDENT_AMBULATORY_CARE_PROVIDER_SITE_OTHER): Payer: 59 | Admitting: Internal Medicine

## 2016-12-21 ENCOUNTER — Encounter: Payer: Self-pay | Admitting: Internal Medicine

## 2016-12-21 VITALS — BP 156/84 | Temp 98.2°F | Wt 186.0 lb

## 2016-12-21 DIAGNOSIS — N930 Postcoital and contact bleeding: Secondary | ICD-10-CM | POA: Diagnosis not present

## 2016-12-21 DIAGNOSIS — N946 Dysmenorrhea, unspecified: Secondary | ICD-10-CM

## 2016-12-21 DIAGNOSIS — Z114 Encounter for screening for human immunodeficiency virus [HIV]: Secondary | ICD-10-CM | POA: Diagnosis not present

## 2016-12-21 DIAGNOSIS — N926 Irregular menstruation, unspecified: Secondary | ICD-10-CM | POA: Diagnosis not present

## 2016-12-21 DIAGNOSIS — R8299 Other abnormal findings in urine: Secondary | ICD-10-CM

## 2016-12-21 DIAGNOSIS — R82998 Other abnormal findings in urine: Secondary | ICD-10-CM

## 2016-12-21 LAB — SEDIMENTATION RATE: SED RATE: 72 mm/h — AB (ref 0–20)

## 2016-12-21 LAB — CBC WITH DIFFERENTIAL/PLATELET
BASOS ABS: 0.1 10*3/uL (ref 0.0–0.1)
Basophils Relative: 0.9 % (ref 0.0–3.0)
EOS ABS: 0 10*3/uL (ref 0.0–0.7)
Eosinophils Relative: 0.1 % (ref 0.0–5.0)
HCT: 39.5 % (ref 36.0–46.0)
Hemoglobin: 13 g/dL (ref 12.0–15.0)
LYMPHS ABS: 2.8 10*3/uL (ref 0.7–4.0)
Lymphocytes Relative: 29.7 % (ref 12.0–46.0)
MCHC: 32.9 g/dL (ref 30.0–36.0)
MCV: 83.7 fl (ref 78.0–100.0)
MONO ABS: 0.6 10*3/uL (ref 0.1–1.0)
Monocytes Relative: 6.8 % (ref 3.0–12.0)
NEUTROS ABS: 5.9 10*3/uL (ref 1.4–7.7)
Neutrophils Relative %: 62.5 % (ref 43.0–77.0)
PLATELETS: 389 10*3/uL (ref 150.0–400.0)
RBC: 4.71 Mil/uL (ref 3.87–5.11)
RDW: 14.2 % (ref 11.5–15.5)
WBC: 9.4 10*3/uL (ref 4.0–10.5)

## 2016-12-21 LAB — POC URINALSYSI DIPSTICK (AUTOMATED)
BILIRUBIN UA: NEGATIVE
Glucose, UA: NEGATIVE
Ketones, UA: NEGATIVE
NITRITE UA: NEGATIVE
PH UA: 6.5
Protein, UA: NEGATIVE
UROBILINOGEN UA: 0.2

## 2016-12-21 LAB — HCG, QUANTITATIVE, PREGNANCY: QUANTITATIVE HCG: 0.31 m[IU]/mL

## 2016-12-21 NOTE — Patient Instructions (Addendum)
Blood work today will let you know the results. We'll arrange a gynecology referral consult.and you will be contacted  Sometimes she can get irregular bleeding in the perimenopausal age but needs to be evaluated to make sure this is not abnormal. You'll be contacted about appointment even if your periods become more normal before you get seen at 1 she did get evaluated. Last Pap smear was done in 2014 and was normal and had negative high risk hpv  Cotesting.   Okay to take ibuprofen   For pain .

## 2016-12-22 ENCOUNTER — Encounter: Payer: Self-pay | Admitting: Obstetrics and Gynecology

## 2016-12-22 ENCOUNTER — Ambulatory Visit (INDEPENDENT_AMBULATORY_CARE_PROVIDER_SITE_OTHER): Payer: 59 | Admitting: Obstetrics and Gynecology

## 2016-12-22 VITALS — BP 148/86 | HR 88 | Resp 16 | Ht 65.5 in | Wt 181.0 lb

## 2016-12-22 DIAGNOSIS — Z124 Encounter for screening for malignant neoplasm of cervix: Secondary | ICD-10-CM

## 2016-12-22 DIAGNOSIS — N930 Postcoital and contact bleeding: Secondary | ICD-10-CM | POA: Diagnosis not present

## 2016-12-22 DIAGNOSIS — N946 Dysmenorrhea, unspecified: Secondary | ICD-10-CM | POA: Diagnosis not present

## 2016-12-22 DIAGNOSIS — N926 Irregular menstruation, unspecified: Secondary | ICD-10-CM | POA: Diagnosis not present

## 2016-12-22 DIAGNOSIS — Z1151 Encounter for screening for human papillomavirus (HPV): Secondary | ICD-10-CM | POA: Diagnosis not present

## 2016-12-22 LAB — HIV ANTIBODY (ROUTINE TESTING W REFLEX): HIV 1&2 Ab, 4th Generation: NONREACTIVE

## 2016-12-22 NOTE — Progress Notes (Signed)
After appointment with Dr Quincy Simmonds, scheduled for pelvic ultrasound, sonohystogram and endometrial biopsy for tomorrow, 12-23-16 at 1:30 here in office. Patietn is instructed to take Motrin 800 mg one hour prior with food.

## 2016-12-22 NOTE — Progress Notes (Signed)
46 y.o. G74P4 Married [place] female here as a new patient for irregular cycles/post coital bleeding; this has happened twice per patient.  For a couple of months, menses changed. Used to have menses every 28 days. Change happened in Nov or Dec. November, menses 5 - 6 days and occurred twice.  December, traveled to [place] and [place].  Bled for 5 - 6 days.  Had heavy bleeding during air travel.   January now, spotting 12/20/16.  This has stopped.  Had post coital bleeding twice.  Has pain in her lower abdomen and lower back.  Ibuprofen helps the pain. Pain only when she has the menstrual type bleeding.   Used to take birth control and developed headaches, so she stopped. Had a suggestion for an IUD, but was fearful and did not receive this.  Using rhythm method for pregnancy prevention.   Had CT of abdomen and pelvis in Sept. 2017 due to abdominal pain and vomiting. Normal.  UC in process after seeing PCP yesterday.  Had normal CBC.  Neg HIV. Neg serum hCG. Denies dysuria.   Elevated blood pressure today.  Did not take anti HTN today.  From [place].  4 children.  One in medical school, one in dental school.  Father died 2 days ago from pancreatic cancer.  She will travel to [place] next week.  PCP:   Dr. Regis Bill - in [facility]   Patient's last menstrual period was 12/06/2016 (approximate).     Period Cycle (Days):  (irregular) Period Duration (Days): 5-6 days Period Pattern: (!) Irregular Menstrual Flow: Moderate Menstrual Control: Maxi pad Menstrual Control Change Freq (Hours): 2 hours Dysmenorrhea: (!) Mild Dysmenorrhea Symptoms: Cramping, Headache, Other (Comment) (back pain)     Sexually active: Yes.    The current method of family planning is none.    Exercising: Yes.    walking Smoker:  no  Health Maintenance: Pap:  03/01/13 pap and HR HPV Normal; see EPIC for details History of abnormal Pap:  no MMG:  11/01/14 - MMG Digital Diagnostic Unilateral Left- BIRADS 2  Benign Colonoscopy:  n/a BMD:   n/a  Result  n/a TDaP:  2007 Gardasil:   no    reports that she has never smoked. She has never used smokeless tobacco. She reports that she does not drink alcohol or use drugs.  Past Medical History:  Diagnosis Date  . Dysmenorrhea   . Essential hypertension 12/27/2014  . Hyperlipidemia   . Migraine    with some aura    Past Surgical History:  Procedure Laterality Date  . NO PAST SURGERIES      Current Outpatient Prescriptions  Medication Sig Dispense Refill  . atorvastatin (LIPITOR) 20 MG tablet Take 1 tablet (20 mg total) by mouth daily. 90 tablet 1  . hydrochlorothiazide (HYDRODIURIL) 25 MG tablet Take 1 tablet (25 mg total) by mouth daily. 90 tablet 0  . ibuprofen (ADVIL,MOTRIN) 600 MG tablet Take 600 mg by mouth every 8 (eight) hours as needed for mild pain.    Marland Kitchen omeprazole (PRILOSEC) 20 MG capsule Take 1 capsule (20 mg total) by mouth daily. 30 capsule 0   No current facility-administered medications for this visit.     Family History  Problem Relation Age of Onset  . Hypertension Mother   . Diabetes Mother   . Stroke Mother     45s [place]  2010  . Cancer Father     ROS:  Pertinent items are noted in HPI.  Otherwise, a comprehensive  ROS was negative.  Exam:   BP (!) 148/86 (BP Location: Right Arm, Patient Position: Sitting, Cuff Size: Normal)   Pulse 88   Resp 16   Ht 5' 5.5" (1.664 m)   Wt 181 lb (82.1 kg)   LMP 12/06/2016 (Approximate)   BMI 29.66 kg/m     General appearance: alert, cooperative and appears stated age Head: Normocephalic, without obvious abnormality, atraumatic Neck: no adenopathy, supple, symmetrical, trachea midline and thyroid normal to inspection and palpation Lungs: clear to auscultation bilaterally Heart: regular rate and rhythm Abdomen: soft, non-tender; no masses, no organomegaly Extremities: extremities normal, atraumatic, no cyanosis or edema Skin: Skin color, texture, turgor normal. No rashes or  lesions Lymph nodes: Cervical, supraclavicular, and axillary nodes normal. No abnormal inguinal nodes palpated Neurologic: Grossly normal  Pelvic: External genitalia:  no lesions              Urethra:  normal appearing urethra with no masses, tenderness or lesions              Bartholins and Skenes: normal                 Vagina: normal appearing vagina with normal color and discharge, no lesions              Cervix:  Ectropion noted anterior cervix.  Bleeding with pap.               Pap taken: Yes.   Bimanual Exam:  Uterus:  normal size, contour, position, consistency, mobility, non-tender              Adnexa: no mass, fullness, tenderness         Chaperone was present for exam.  Assessment:   Postcoital bleeding.  Dysmenorrhea.  Irregular menses. Cervical cancer screening. Recent loss of father to pancreatic cancer. HTN.  Migraine with aura.  Plan:   Discussion of irregular vaginal bleeding (and postcoital bleeding).  Perimenopausal anovulation, infection, polyp, fibroids, precancerous or cancerous change. Pap, HR HP, and GC/CT performed.  Return for pelvic ultrasound, possible sonohysterogram, possible EMB. Will need TFTs done at return visit.  Not a candidate for combined oral contraceptives. Will need to update mammogram.   After visit summary provided.   __30_____ minutes face to face time of which over 50% was spent in counseling.

## 2016-12-23 ENCOUNTER — Other Ambulatory Visit: Payer: Self-pay | Admitting: Obstetrics and Gynecology

## 2016-12-23 ENCOUNTER — Ambulatory Visit (INDEPENDENT_AMBULATORY_CARE_PROVIDER_SITE_OTHER): Payer: 59 | Admitting: Obstetrics and Gynecology

## 2016-12-23 ENCOUNTER — Ambulatory Visit (INDEPENDENT_AMBULATORY_CARE_PROVIDER_SITE_OTHER): Payer: 59

## 2016-12-23 ENCOUNTER — Encounter: Payer: Self-pay | Admitting: Obstetrics and Gynecology

## 2016-12-23 VITALS — BP 144/82 | HR 90 | Ht 65.5 in | Wt 182.0 lb

## 2016-12-23 DIAGNOSIS — N926 Irregular menstruation, unspecified: Secondary | ICD-10-CM | POA: Diagnosis not present

## 2016-12-23 DIAGNOSIS — N9489 Other specified conditions associated with female genital organs and menstrual cycle: Secondary | ICD-10-CM

## 2016-12-23 DIAGNOSIS — N840 Polyp of corpus uteri: Secondary | ICD-10-CM | POA: Diagnosis not present

## 2016-12-23 DIAGNOSIS — D219 Benign neoplasm of connective and other soft tissue, unspecified: Secondary | ICD-10-CM

## 2016-12-23 HISTORY — DX: Benign neoplasm of connective and other soft tissue, unspecified: D21.9

## 2016-12-23 LAB — IPS N GONORRHOEA AND CHLAMYDIA BY PCR

## 2016-12-23 LAB — URINE CULTURE

## 2016-12-23 NOTE — Progress Notes (Signed)
Encounter reviewed by Dr. Brook Amundson C. Silva.  

## 2016-12-23 NOTE — Progress Notes (Signed)
urine culture showed multiple bacteria but not predominant germ. Cannot tell if she has  UTI. But unlikely  HIV neg

## 2016-12-23 NOTE — Patient Instructions (Signed)

## 2016-12-23 NOTE — Progress Notes (Signed)
Patient ID: Teresa Hickman, female   DOB: 12-Oct-1971, 46 y.o.   MRN: [id number] GYNECOLOGY  VISIT   HPI: 46 y.o.   Married [place] female   [phone] with Patient's last menstrual period was 12/06/2016 (approximate).   here for pelvic ultrasound for irregular vaginal bleeding and post coital bleeding.    Pap and GC/CT pending.   Neg quant beta hCG on 12/21/16.  Traveling to [place] on 01/04/17 for father's funeral.  She will stay for 9 days.  GYNECOLOGIC HISTORY: Patient's last menstrual period was 12/06/2016 (approximate). Contraception:  none Menopausal hormone therapy:  n/a Last mammogram:  11/01/14 - MMG Digital Diagnostic Unilateral Left- BIRADS 2 Benign Last pap smear:  03/01/13 pap and HR HPV Normal; see EPIC for details(pap 12-22-16 pending)          OB History    Gravida Para Term Preterm AB Living   4 4       4    SAB TAB Ectopic Multiple Live Births           4         Patient Active Problem List   Diagnosis Date Noted  . Neck pain 06/21/2016  . Migraine headache without aura 06/21/2016  . 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 Cervical paraspinal muscle spasm 06/09/2016  . Chest pain 04/12/2016  . Arthritis of left lower extremity 05/29/2015  . Synovitis of knee 05/09/2015  . Fracture of first metatarsal bone 01/09/2015  . Right foot pain 12/27/2014  . Essential hypertension 12/27/2014  . Patellofemoral pain syndrome 08/12/2014  . Patellar pain 08/01/2014  . Pain of left lower extremity 08/01/2014  . Hx of migraine with aura 06/18/2013  . Elevated blood pressure reading 06/18/2013  . Bilateral anterior knee pain 09/23/2012  . Low back pain 09/23/2012  . Contraception management 03/18/2011  . Irregular menses 03/18/2011  . ELEVATED BLOOD PRESSURE WITHOUT DIAGNOSIS OF HYPERTENSION 03/02/2010  . AMENORRHEA 10/16/2008  . Hyperlipidemia 05/01/2008  . MIGRAINE HEADACHE 05/01/2008  . HEADACHE 05/01/2008  . LUMBAR STRAIN 05/01/2008    Past Medical History:   Diagnosis Date  . Dysmenorrhea   . Essential hypertension 12/27/2014  . Hyperlipidemia   . Migraine    with some aura    Past Surgical History:  Procedure Laterality Date  . NO PAST SURGERIES      Current Outpatient Prescriptions  Medication Sig Dispense Refill  . atorvastatin (LIPITOR) 20 MG tablet Take 1 tablet (20 mg total) by mouth daily. 90 tablet 1  . hydrochlorothiazide (HYDRODIURIL) 25 MG tablet Take 1 tablet (25 mg total) by mouth daily. 90 tablet 0  . ibuprofen (ADVIL,MOTRIN) 600 MG tablet Take 600 mg by mouth every 8 (eight) hours as needed for mild pain.    Marland Kitchen omeprazole (PRILOSEC) 20 MG capsule Take 1 capsule (20 mg total) by mouth daily. 30 capsule 0   No current facility-administered medications for this visit.      ALLERGIES: Patient has no known allergies.  Family History  Problem Relation Age of Onset  . Hypertension Mother   . Diabetes Mother   . Stroke Mother     18s [place]  2010  . Cancer Father     Social History   Social History  . Marital status: Married    Spouse name: Teresa Hickman  . Number of children: 4  . Years of education: 16   Occupational History  . Unemployed    Social History Main Topics  . Smoking status: Never Smoker  .  Smokeless tobacco: Never Used  . Alcohol use No  . Drug use: No  . Sexual activity: Yes    Partners: Male    Birth control/ protection: None   Other Topics Concern  . Not on file   Social History Narrative   Lives with husband and son, [name]   From [place]   Medical Family   HH of 6    No pets   G4 p4    Caffeine use: 1 cup coffee per day    ROS:  Pertinent items are noted in HPI.  PHYSICAL EXAMINATION:    BP (!) 144/82 (BP Location: Right Arm, Patient Position: Sitting, Cuff Size: Normal)   Pulse 90   Ht 5' 5.5" (1.664 m)   Wt 182 lb (82.6 kg)   LMP 12/06/2016 (Approximate)   BMI 29.83 kg/m     General appearance: alert, cooperative and appears stated age    Technique:  Both transabdominal and  transvaginal ultrasound examinations of the pelvis were performed. Transabdominal technique was performed for global imaging of the pelvis including uterus, ovaries, adnexal regions, and pelvic cul-de-sac. It was necessary to proceed with endovaginal exam following the abdominal ultrasound.  Transabdominal exam to visualize the endometrium and adnexa.  Color and duplex Doppler ultrasound was utilized to evaluate blood flow to the ovaries.   Pelvic ultrasound One fibroid about 1 cm. EMS 7.68 mm. No masses. Normal ovaries. Left ovary with 11 mm follicle.  Right ovary difficult to see. No free fluid.  Procedure - sonohysterogram Consent performed. Speculum placed in vagina. Sterile prep of cervix with Hibiclens Cannula placed inside endometrial cavity without difficulty. Speculum removed. Sterile saline injected.    15 mm          filling defect noted. Cannula removed. No complication.   Procedure - endometrial biopsy Consent performed. Speculum place in vagina.  Sterile prep of cervix with Hibiclens. Pipelle placed to   Almost 8  cm without difficulty twice. Tissue obtained and sent to pathology.  GPA rush. Speculum removed.  No complications. Minimal EBL.  Chaperone was present for exam.  ASSESSMENT  Endometrial mass.  Fibroid. Irregular menses. Postcoital bleeding.  PLAN  Follow up pap and GC/CT. Follow up EMB.  Discussion of abnormal uterine bleeding, fibroid, and polyp. Discussion of hysteroscopy with Myosure polypectomy, dilation and curettage.  Handouts given. Risks, benefits, and alternatives reviewed. Risks include but are not limited to bleeding, infection, damage to surrounding organs including uterine perforation requiring hospitalization and laparoscopy, pulmonary edema, reaction to anesthesia, DVT, PE, death, need for further treatment and surgery including repeat hysteroscopy, hysterectomy or medical therapy.   Surgical expectations and recovery discussed.   We will proceed after final pathology back and patient returns from [place]. If pathology shows malignancy, will send to [place].   An After Visit Summary was printed and given to the patient.  __25____ minutes face to face time of which over 50% was spent in counseling.

## 2016-12-24 ENCOUNTER — Encounter: Payer: Self-pay | Admitting: Obstetrics and Gynecology

## 2016-12-27 ENCOUNTER — Other Ambulatory Visit: Payer: Self-pay | Admitting: *Deleted

## 2016-12-27 LAB — IPS PAP TEST WITH HPV

## 2016-12-27 MED ORDER — METRONIDAZOLE 500 MG PO TABS: 500.0000 mg | ORAL_TABLET | Freq: Two times a day (BID) | ORAL | 0 refills | Status: DC

## 2017-01-03 ENCOUNTER — Telehealth: Payer: Self-pay | Admitting: *Deleted

## 2017-01-03 NOTE — Telephone Encounter (Signed)
Thank you for the update!

## 2017-01-03 NOTE — Telephone Encounter (Signed)
-----   Message from Nunzio Cobbs, MD sent at 12/26/2016  6:30 PM EST ----- Please contact patient regarding her final EMB results showing a benign endometrial polyp.  We will proceed forward with a hysteroscopy with Myosure polypectomy and dilation and curettage when she returns from [place]. She is traveling for her father's funeral. OK to send to precert.  Cc- Marisa Sprinkles

## 2017-01-03 NOTE — Telephone Encounter (Signed)
Call to patient. She states she is still traveling and is actually on plane now. Very noisy connection and flight attendant audible in background. Attempted to give brief instruction that benign polyp was noted  On pathology report and to call back when returns from travel. Patient asks me to speak to husband, approved also on DPR. Advised pathology report indicated benign polyp and Dr Quincy Simmonds would like to plan to proceed with Hysteroscopy/polypectomy/D&C when they return from travel. Husband states he is a physician and he will explain to patient and they will call to schedule when they return.

## 2017-01-05 NOTE — Telephone Encounter (Signed)
Called patient to review benefits for a recommended surgical procedure. Left Voicemail requesting a call back. °

## 2017-01-24 ENCOUNTER — Telehealth: Payer: Self-pay | Admitting: *Deleted

## 2017-01-24 NOTE — Telephone Encounter (Signed)
See previous phone encounter regarding surgery scheduling. Patient has been traveling to [place] for fathers funeral service.  Call to patient to follow-up on plans for scheduling surgery. Left message to call back. Per ROI, can leave message on cell number.  Call to hone number. No answer, no voice mail.

## 2017-01-24 NOTE — Telephone Encounter (Signed)
Call to patient, left message to call back. See next phone encounter for updates on scheduling.  Routing to provider for final review. Patient agreeable to disposition. Will close encounter.

## 2017-02-07 NOTE — Telephone Encounter (Signed)
Call to patient to follow up regarding benefits and scheduling recommended surgical procedure. Left message requesting a return call.  Routing to [employer]

## 2017-02-10 NOTE — Telephone Encounter (Signed)
Call to patient to follow-up on scheduling surgery. Offered sympathy for [employer].  Patient states she is not ready to proceed right now. States she "will call back and let us know" when she is ready.She denies questions and states she and husband have full understanding. Advised that Dr Quincy Simmonds would recommend proceeding as soon as possible and avoid further delay. Need to remove polyp. Patient states again that she and husband understand and she will call back when ready to schedule.  Routing to Dr Quincy Simmonds for review

## 2017-02-14 NOTE — Telephone Encounter (Signed)
Please send letter recommending removal of polyp.  Standard of care is for complete excision of the polyp through hysteroscopic resection.  This needs to be fully evaluated to rule out precancerous or cancerous change.

## 2017-02-20 ENCOUNTER — Encounter: Payer: Self-pay | Admitting: *Deleted

## 2017-02-21 NOTE — Telephone Encounter (Signed)
Letter to your office for review. 

## 2017-02-21 NOTE — Telephone Encounter (Signed)
Letter reviewed and signed by Dr Silva. Mailed certified and regular [place] mail. Encounter closed.   

## 2017-02-24 ENCOUNTER — Telehealth: Payer: Self-pay | Admitting: Obstetrics and Gynecology

## 2017-02-24 NOTE — Telephone Encounter (Signed)
Incoming phone call from Dr. Barbette Merino regarding his wife's care.  DPI confirms that I can speak with Dr. Jonelle Sidle.  (He runs the [facility] at [facility].) We just sent a 30 day letter to patient regarding recommendation to proceed with care for her symptomatic endometrial polyp as she had previously chosen not to proceed.  Her husband indicates she was focusing on her small fibroid and that she did not think this was a concern.  She did become concerned after she received the 30 day letter however. I reviewed her sonohysterogram findings and endometrial biopsy and reported that she has a benign polyp that is 15 cm and that hysteroscopic removal is the standard of care.  The fibroid represents a benign growth and does not need removal.  Risk of sarcoma is less than 1%. I discussed this is an outpatient procedure and that there can be rare risks of uterine perforation, pulmonary edema, and hyponatremia.  He requests that we proceed forward in scheduling the surgery.  We will precert and call the patient with this information and surgical dates available.   He expressed appreciation for the phone conversation.

## 2017-02-24 NOTE — Telephone Encounter (Signed)
Spoke with patient regarding benefit for surgery. Patient understood information presented. Patient states she will call back to the office on Monday, 02/28/17 to proceed with scheduling recommended procedure.  Routing to [employer]

## 2017-03-01 NOTE — Telephone Encounter (Signed)
Call to patient regarding surgery scheduling. Patient states she is working on Education officer, environmental and will call back when ready to schedule. Advised of recommendation for proceeding as soon as possible as stated in letter and Dr Quincy Simmonds reviewed with husband in recent phone call. Patient asking about week of Spring break. Advised this week is already full and will need to review April schedule with Dr Quincy Simmonds. First available date is 03-08-17 and encouraged patient to consider this date and will review alternatives with Dr Quincy Simmonds.

## 2017-03-08 ENCOUNTER — Other Ambulatory Visit: Payer: Self-pay | Admitting: Gastroenterology

## 2017-03-08 DIAGNOSIS — R1084 Generalized abdominal pain: Secondary | ICD-10-CM

## 2017-03-08 DIAGNOSIS — K219 Gastro-esophageal reflux disease without esophagitis: Secondary | ICD-10-CM | POA: Diagnosis not present

## 2017-03-08 DIAGNOSIS — R110 Nausea: Secondary | ICD-10-CM | POA: Diagnosis not present

## 2017-03-08 DIAGNOSIS — R1011 Right upper quadrant pain: Secondary | ICD-10-CM | POA: Diagnosis not present

## 2017-03-08 DIAGNOSIS — K5904 Chronic idiopathic constipation: Secondary | ICD-10-CM | POA: Diagnosis not present

## 2017-03-15 NOTE — Telephone Encounter (Signed)
Spoke with patients spouse, Dr Jonelle Sidle ( he is listed on DPR)  in regards to recommended surgery.  Prepayment has been provided. Dr Jonelle Sidle request to schedule surgery anywhere from 04/05/17 through 04/08/17. I advised this information will be forwarded to our nurse supervisor, who will review available dates with Dr Elza Rafter surgery schedule and get back in touch with them for securing a surgery date.  Routing to Dr Quincy Simmonds  cc: Lamont Snowball

## 2017-03-15 NOTE — Telephone Encounter (Signed)
Follow-up call to patient regarding surgery scheduling. Patient states she thought her husband called to arrange this and she will call him and discuss date options. She needs to arrange with her daughter's spring break. Advised recommend proceeding in timely manner as this has already been delayed. Also advised that will need some flexibility with dates to see what is open with Dr Elza Rafter schedule.Patient states she will call back.  Routing to Dr Quincy Simmonds for review. Certified letter was mailed on 02-21-17.

## 2017-03-18 ENCOUNTER — Telehealth: Payer: Self-pay | Admitting: *Deleted

## 2017-03-18 NOTE — Progress Notes (Signed)
GYNECOLOGY  VISIT   HPI: 46 y.o.   Married  [place]  female   [phone] with Patient's last menstrual period was 03/19/2017.   here for surgical consult.   Patient has abnormal uterine bleeding and evaluation on 12/23/16. Pelvic ultrasound showed 11 mm anterior intramural fibroid, echogenic endometrium, left ovarian follicle 11 mm, and right ovary difficult to measure. Sonohysterogram showed 15 mm filling defect and EMB confirmed benign polyp.  On her menses now. Menses last for 12 days.  Can pad up to every hour but more for freshness. Can stain through at night. First two days having more cramping.  No more irregular bleeding.   Having abdominal bloating.  Now starting a probiotic. Will have [place] of abdomen and HIDA scan tomorrow through Dr. Collene Mares.   Sometimes has chest pain sometimes during her menses. Had cardiology evaluation in May 2017. Normal stress test. EF > 65%. ECHO unremarkable.   Arthritis in left knee.  GYNECOLOGIC HISTORY: Patient's last menstrual period was 03/19/2017. Contraception:  none Menopausal hormone therapy:  n/a Last mammogram: 11-01-14 Density C/Lt.Br.poss.asymmetry,Rt.Br.neg--rec.further evaluation. 11/18/14 - MMG Digital Diagnostic Unilateral Left- BIRADS 2 Benign:TBC--screening 1 year Last pap smear:   12-22-16 Neg:Neg HR HPV;03-01-13 Neg:Neg HR HPV        OB History    Gravida Para Term Preterm AB Living   4 4       4    SAB TAB Ectopic Multiple Live Births           4         Patient Active Problem List   Diagnosis Date Noted  . Neck pain 06/21/2016  . Migraine headache without aura 06/21/2016  . 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 Cervical paraspinal muscle spasm 06/09/2016  . Chest pain 04/12/2016  . Arthritis of left lower extremity 05/29/2015  . Synovitis of knee 05/09/2015  . Fracture of first metatarsal bone 01/09/2015  . Right foot pain 12/27/2014  . Essential hypertension 12/27/2014  . Patellofemoral pain syndrome  08/12/2014  . Patellar pain 08/01/2014  . Pain of left lower extremity 08/01/2014  . Hx of migraine with aura 06/18/2013  . Elevated blood pressure reading 06/18/2013  . Bilateral anterior knee pain 09/23/2012  . Low back pain 09/23/2012  . Contraception management 03/18/2011  . Irregular menses 03/18/2011  . ELEVATED BLOOD PRESSURE WITHOUT DIAGNOSIS OF HYPERTENSION 03/02/2010  . AMENORRHEA 10/16/2008  . Hyperlipidemia 05/01/2008  . MIGRAINE HEADACHE 05/01/2008  . HEADACHE 05/01/2008  . LUMBAR STRAIN 05/01/2008    Past Medical History:  Diagnosis Date  . Dysmenorrhea   . Essential hypertension 12/27/2014  . Fibroid 12/23/2016   1 cm   . Hyperlipidemia   . Migraine    with some aura    Past Surgical History:  Procedure Laterality Date  . NO PAST SURGERIES      Current Outpatient Prescriptions  Medication Sig Dispense Refill  . hydrochlorothiazide (HYDRODIURIL) 25 MG tablet Take 1 tablet (25 mg total) by mouth daily. 90 tablet 0  . ibuprofen (ADVIL,MOTRIN) 600 MG tablet Take 600 mg by mouth every 8 (eight) hours as needed for mild pain.    Marland Kitchen omeprazole (PRILOSEC) 20 MG capsule Take 1 capsule (20 mg total) by mouth daily. 30 capsule 0  . atorvastatin (LIPITOR) 20 MG tablet Take 1 tablet (20 mg total) by mouth daily. (Patient not taking: Reported on 03/21/2017) 90 tablet 1   No current facility-administered medications for this visit.      ALLERGIES: Patient has  no known allergies.  Family History  Problem Relation Age of Onset  . Hypertension Mother   . Diabetes Mother   . Stroke Mother     55s [place]  2010  . Cancer Father     Social History   Social History  . Marital status: Married    Spouse name: Lawal  . Number of children: 4  . Years of education: 16   Occupational History  . Unemployed    Social History Main Topics  . Smoking status: Never Smoker  . Smokeless tobacco: Never Used  . Alcohol use No  . Drug use: No  . Sexual activity: Yes    Partners:  Male    Birth control/ protection: None   Other Topics Concern  . Not on file   Social History Narrative   Lives with husband and son, [name]   From [place]   Medical Family   HH of 6    No pets   G4 p4    Caffeine use: 1 cup coffee per day    ROS:  Pertinent items are noted in HPI.  PHYSICAL EXAMINATION:    BP (!) 144/84 (BP Location: Right Arm, Patient Position: Sitting, Cuff Size: Normal)   Pulse 80   Ht 5' 5.5" (1.664 m)   Wt 182 lb (82.6 kg)   LMP 03/19/2017   BMI 29.83 kg/m     General appearance: alert, cooperative and appears stated age Head: Normocephalic, without obvious abnormality, atraumatic Neck: no adenopathy, supple, symmetrical, trachea midline and thyroid normal to inspection and palpation Lungs: clear to auscultation bilaterally Heart: regular rate and rhythm Abdomen: soft, non-tender, no masses,  no organomegaly Extremities: extremities normal, atraumatic, no cyanosis or edema Skin: Skin color, texture, turgor normal. No rashes or lesions No abnormal inguinal nodes palpated Neurologic: Grossly normal  Pelvic: External genitalia:  no lesions              Urethra:  normal appearing urethra with no masses, tenderness or lesions              Bartholins and Skenes: normal                 Vagina: normal appearing vagina with normal color and discharge, no lesions              Cervix: no lesions                Bimanual Exam:  Uterus:  normal size, contour, position, consistency, mobility, non-tender              Adnexa: no mass, fullness, tenderness        Chaperone was present for exam.  ASSESSMENT  Abnormal uterine bleeding.  Endometrial polyp.  PLAN  Discussed endometrial polyp and uterine fibroid. Recommended hysteroscopic Myosure resection of endometrial polyp with dilation and curettage.  Risks, benefits, and alternatives reviewed. Risks include but are not limited to bleeding, infection, damage to surrounding organs including uterine  perforation requiring hospitalization and laparoscopy, reaction to anesthesia, DVT, PE, death, and need for further treatment. Surgical expectations and recovery discussed.  Patient wishes to proceed. Abstinence of intercourse until 2 weeks after surgery.  Mammogram scheduled.   An After Visit Summary was printed and given to the patient.  ___25_ minutes face to face time of which over 50% was spent in counseling.

## 2017-03-18 NOTE — Telephone Encounter (Signed)
See previous encounter. Call to patient. She is driving and will call back. Calling to confirm surgery is scheduled for 04-04-17 at 1100 at [facility]. Need to scheduled consult appointment and review surgery instructions.

## 2017-03-18 NOTE — Telephone Encounter (Signed)
Call from patient. Advised surgery is scheduled for 04-04-17 at 1100 at [place] surgery time is subject to change. Surgery instruction sheet reviewed and printed copy will be provided at consult appointment scheduled for Monday 03-21-17.  Routing to provider for final review. Patient agreeable to disposition. Will close encounter.

## 2017-03-21 ENCOUNTER — Encounter: Payer: Self-pay | Admitting: Obstetrics and Gynecology

## 2017-03-21 ENCOUNTER — Ambulatory Visit (INDEPENDENT_AMBULATORY_CARE_PROVIDER_SITE_OTHER): Payer: 59 | Admitting: Obstetrics and Gynecology

## 2017-03-21 VITALS — BP 144/84 | HR 80 | Ht 65.5 in | Wt 182.0 lb

## 2017-03-21 DIAGNOSIS — D259 Leiomyoma of uterus, unspecified: Secondary | ICD-10-CM

## 2017-03-21 DIAGNOSIS — Z1231 Encounter for screening mammogram for malignant neoplasm of breast: Secondary | ICD-10-CM

## 2017-03-21 DIAGNOSIS — N840 Polyp of corpus uteri: Secondary | ICD-10-CM | POA: Diagnosis not present

## 2017-03-21 DIAGNOSIS — N921 Excessive and frequent menstruation with irregular cycle: Secondary | ICD-10-CM

## 2017-03-21 NOTE — Progress Notes (Signed)
[facility]2017 at 11:20 am. Patient is agreeable to date and time.

## 2017-03-21 NOTE — Patient Instructions (Signed)
I will see you the day of surgery.  Please call if you need anything prior to this.

## 2017-03-22 ENCOUNTER — Encounter (HOSPITAL_COMMUNITY)
Admission: RE | Admit: 2017-03-22 | Discharge: 2017-03-22 | Disposition: A | Discharge status: Home / Self Care | Payer: 59 | Source: Ambulatory Visit | Attending: Gastroenterology | Admitting: Gastroenterology

## 2017-03-22 ENCOUNTER — Ambulatory Visit (HOSPITAL_COMMUNITY)
Admission: RE | Admit: 2017-03-22 | Discharge: 2017-03-22 | Disposition: A | Discharge status: Home / Self Care | Payer: 59 | Source: Ambulatory Visit | Attending: Gastroenterology | Admitting: Gastroenterology

## 2017-03-22 DIAGNOSIS — R109 Unspecified abdominal pain: Secondary | ICD-10-CM | POA: Diagnosis not present

## 2017-03-22 DIAGNOSIS — R1084 Generalized abdominal pain: Secondary | ICD-10-CM

## 2017-03-22 MED ORDER — TECHNETIUM TC 99M MEBROFENIN IV KIT
5.4000 | PACK | Freq: Once | INTRAVENOUS | Status: AC
  Administered 2017-03-22: 5.4 via INTRAVENOUS

## 2017-03-29 ENCOUNTER — Telehealth: Payer: Self-pay | Admitting: *Deleted

## 2017-03-29 NOTE — Telephone Encounter (Signed)
Call to patient. Advised time of surgery has been moved up to [place], arrive at [place].  Routing to provider for final review. Patient agreeable to disposition. Will close encounter.

## 2017-03-29 NOTE — Patient Instructions (Addendum)
Your procedure is scheduled on:  Monday, April 04, 2017  Enter through the [employer] [facility] at:  7:15 AM  Pick up the phone at the desk and dial [phone].  Call this number if you have problems the morning of surgery: [phone].  Remember: Do NOT eat food or drink after:  Midnight Sunday  Take these medicines the morning of surgery with a SIP OF WATER:  Hydrochlorothiazide, Omeprazole  Stop ALL herbal medications at this time  Do NOT smoke the day of surgery.  Do NOT wear jewelry (body piercing), metal hair clips/bobby pins, make-up, or nail polish. Do NOT wear lotions, powders, or perfumes.  You may wear deodorant. Do NOT shave for 48 hours prior to surgery. Do NOT bring valuables to the hospital. Contacts, dentures, or bridgework may not be worn into surgery.  Have a responsible adult drive you home and stay with you for 24 hours after your procedure  Bring a copy of your healthcare power of attorney and living will documents.

## 2017-03-30 ENCOUNTER — Encounter (HOSPITAL_COMMUNITY): Payer: Self-pay

## 2017-03-30 ENCOUNTER — Encounter (HOSPITAL_COMMUNITY)
Admission: RE | Admit: 2017-03-30 | Discharge: 2017-03-30 | Disposition: A | Discharge status: Home / Self Care | Payer: 59 | Source: Ambulatory Visit | Attending: Obstetrics and Gynecology | Admitting: Obstetrics and Gynecology

## 2017-03-30 DIAGNOSIS — Z01812 Encounter for preprocedural laboratory examination: Secondary | ICD-10-CM | POA: Insufficient documentation

## 2017-03-30 DIAGNOSIS — Z79899 Other long term (current) drug therapy: Secondary | ICD-10-CM | POA: Diagnosis not present

## 2017-03-30 DIAGNOSIS — M545 Low back pain: Secondary | ICD-10-CM | POA: Insufficient documentation

## 2017-03-30 DIAGNOSIS — Z823 Family history of stroke: Secondary | ICD-10-CM | POA: Insufficient documentation

## 2017-03-30 DIAGNOSIS — M1712 Unilateral primary osteoarthritis, left knee: Secondary | ICD-10-CM | POA: Insufficient documentation

## 2017-03-30 DIAGNOSIS — R079 Chest pain, unspecified: Secondary | ICD-10-CM | POA: Diagnosis not present

## 2017-03-30 DIAGNOSIS — I10 Essential (primary) hypertension: Secondary | ICD-10-CM | POA: Insufficient documentation

## 2017-03-30 DIAGNOSIS — N939 Abnormal uterine and vaginal bleeding, unspecified: Secondary | ICD-10-CM | POA: Insufficient documentation

## 2017-03-30 DIAGNOSIS — Z809 Family history of malignant neoplasm, unspecified: Secondary | ICD-10-CM | POA: Diagnosis not present

## 2017-03-30 DIAGNOSIS — Z833 Family history of diabetes mellitus: Secondary | ICD-10-CM | POA: Diagnosis not present

## 2017-03-30 DIAGNOSIS — Z8249 Family history of ischemic heart disease and other diseases of the circulatory system: Secondary | ICD-10-CM | POA: Diagnosis not present

## 2017-03-30 DIAGNOSIS — N840 Polyp of corpus uteri: Secondary | ICD-10-CM | POA: Diagnosis not present

## 2017-03-30 HISTORY — DX: Gastro-esophageal reflux disease without esophagitis: K21.9

## 2017-03-30 HISTORY — DX: Unspecified osteoarthritis, unspecified site: M19.90

## 2017-03-30 LAB — CBC
HEMATOCRIT: 39.2 % (ref 36.0–46.0)
Hemoglobin: 12.5 g/dL (ref 12.0–15.0)
MCH: 28.1 pg (ref 26.0–34.0)
MCHC: 31.9 g/dL (ref 30.0–36.0)
MCV: 88.1 fL (ref 78.0–100.0)
Platelets: 297 10*3/uL (ref 150–400)
RBC: 4.45 MIL/uL (ref 3.87–5.11)
RDW: 14 % (ref 11.5–15.5)
WBC: 7 10*3/uL (ref 4.0–10.5)

## 2017-03-30 LAB — BASIC METABOLIC PANEL
ANION GAP: 7 (ref 5–15)
BUN: 8 mg/dL (ref 6–20)
CALCIUM: 9.4 mg/dL (ref 8.9–10.3)
CO2: 30 mmol/L (ref 22–32)
Chloride: 102 mmol/L (ref 101–111)
Creatinine, Ser: 0.51 mg/dL (ref 0.44–1.00)
Glucose, Bld: 126 mg/dL — ABNORMAL HIGH (ref 65–99)
Potassium: 3.6 mmol/L (ref 3.5–5.1)
SODIUM: 139 mmol/L (ref 135–145)

## 2017-04-03 NOTE — H&P (Signed)
Office Visit   03/21/2017 [place], MD  Obstetrics and Gynecology   Metrorrhagia +3 more  Dx   Surgical Consult ; Referred by Burnis Medin, MD  Reason for Visit   Additional Documentation   Vitals:   BP  144/84 (BP Location: Right Arm, Patient Position: Sitting, Cuff Size: Normal)   Pulse 80   Ht 5' 5.5" (1.664 m)   Wt 182 lb (82.6 kg)   LMP 03/19/2017   BMI 29.83 kg/m   BSA 1.95 m      More Vitals   Flowsheets:   Custom Formula Data,   MEWS Score,   Anthropometrics,   Infectious Disease Screening     Encounter Info:   Billing Info,   History,   Allergies,   Detailed Report     All Notes   Progress Notes by Gwendlyn Deutscher, RN at 03/21/2017 11:30 AM   Author: Gwendlyn Deutscher, RN Author Type: Registered Nurse Filed: 03/23/2017 12:35 PM  Note Status: Signed Cosign: Cosign Not Required Encounter Date: 03/21/2017  Editor: Gwendlyn Deutscher, RN (Registered Nurse)    Screening mammogram scheduled with the [facility] on 04/12/2017 at 11:20 am. Patient is agreeable to date and time.    Progress Notes by Nunzio Cobbs, MD at 03/21/2017 11:30 AM   Author: Nunzio Cobbs, MD Author Type: Physician Filed: 03/23/2017 12:35 PM  Note Status: Signed Cosign: Cosign Not Required Encounter Date: 03/21/2017  Editor: Nunzio Cobbs, MD (Physician)  Prior Versions: 1. Lowella Fairy, CMA (Certified Medical Assistant) at 03/21/2017 11:40 AM - Sign at close encounter    GYNECOLOGY  VISIT   HPI: 46 y.o.   Married  [place]  female   [phone] with Patient's last menstrual period was 03/19/2017.   here for surgical consult.   Patient has abnormal uterine bleeding and evaluation on 12/23/16. Pelvic ultrasound showed 11 mm anterior intramural fibroid, echogenic endometrium, left ovarian follicle 11 mm, and right ovary difficult to measure. Sonohysterogram showed 15 mm filling defect and EMB confirmed benign  polyp.  On her menses now. Menses last for 12 days.  Can pad up to every hour but more for freshness. Can stain through at night. First two days having more cramping.  No more irregular bleeding.   Having abdominal bloating.  Now starting a probiotic. Will have [place] of abdomen and HIDA scan tomorrow through Dr. Collene Mares.   Sometimes has chest pain sometimes during her menses. Had cardiology evaluation in May 2017. Normal stress test. EF > 65%. ECHO unremarkable.   Arthritis in left knee.  GYNECOLOGIC HISTORY: Patient's last menstrual period was 03/19/2017. Contraception:  none Menopausal hormone therapy:  n/a Last mammogram: 11-01-14 Density C/Lt.Br.poss.asymmetry,Rt.Br.neg--rec.further evaluation. 11/18/14 - MMG Digital Diagnostic Unilateral Left- BIRADS 2 Benign:TBC--screening 1 year Last pap smear:   12-22-16 Neg:Neg HR HPV;03-01-13 Neg:Neg HR HPV                OB History    Gravida Para Term Preterm AB Living   4 4       4    SAB TAB Ectopic Multiple Live Births           4             Patient Active Problem List   Diagnosis Date Noted  . Neck pain 06/21/2016  . Migraine headache without aura 06/21/2016  . 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  Cervical paraspinal muscle spasm 06/09/2016  . Chest pain 04/12/2016  . Arthritis of left lower extremity 05/29/2015  . Synovitis of knee 05/09/2015  . Fracture of first metatarsal bone 01/09/2015  . Right foot pain 12/27/2014  . Essential hypertension 12/27/2014  . Patellofemoral pain syndrome 08/12/2014  . Patellar pain 08/01/2014  . Pain of left lower extremity 08/01/2014  . Hx of migraine with aura 06/18/2013  . Elevated blood pressure reading 06/18/2013  . Bilateral anterior knee pain 09/23/2012  . Low back pain 09/23/2012  . Contraception management 03/18/2011  . Irregular menses 03/18/2011  . ELEVATED BLOOD PRESSURE WITHOUT DIAGNOSIS OF HYPERTENSION 03/02/2010  . AMENORRHEA 10/16/2008  .  Hyperlipidemia 05/01/2008  . MIGRAINE HEADACHE 05/01/2008  . HEADACHE 05/01/2008  . LUMBAR STRAIN 05/01/2008        Past Medical History:  Diagnosis Date  . Dysmenorrhea   . Essential hypertension 12/27/2014  . Fibroid 12/23/2016   1 cm   . Hyperlipidemia   . Migraine    with some aura         Past Surgical History:  Procedure Laterality Date  . NO PAST SURGERIES            Current Outpatient Prescriptions  Medication Sig Dispense Refill  . hydrochlorothiazide (HYDRODIURIL) 25 MG tablet Take 1 tablet (25 mg total) by mouth daily. 90 tablet 0  . ibuprofen (ADVIL,MOTRIN) 600 MG tablet Take 600 mg by mouth every 8 (eight) hours as needed for mild pain.    Marland Kitchen omeprazole (PRILOSEC) 20 MG capsule Take 1 capsule (20 mg total) by mouth daily. 30 capsule 0  . atorvastatin (LIPITOR) 20 MG tablet Take 1 tablet (20 mg total) by mouth daily. (Patient not taking: Reported on 03/21/2017) 90 tablet 1   No current facility-administered medications for this visit.      ALLERGIES: Patient has no known allergies.        Family History  Problem Relation Age of Onset  . Hypertension Mother   . Diabetes Mother   . Stroke Mother     28s [place]  2010  . Cancer Father     Social History        Social History  . Marital status: Married    Spouse name: Lawal  . Number of children: 4  . Years of education: 16       Occupational History  . Unemployed         Social History Main Topics  . Smoking status: Never Smoker  . Smokeless tobacco: Never Used  . Alcohol use No  . Drug use: No  . Sexual activity: Yes    Partners: Male    Birth control/ protection: None       Other Topics Concern  . Not on file      Social History Narrative   Lives with husband and son, [name]   From [place]   Medical Family   HH of 6    No pets   G4 p4    Caffeine use: 1 cup coffee per day    ROS:  Pertinent items are noted in HPI.  PHYSICAL  EXAMINATION:    BP (!) 144/84 (BP Location: Right Arm, Patient Position: Sitting, Cuff Size: Normal)   Pulse 80   Ht 5' 5.5" (1.664 m)   Wt 182 lb (82.6 kg)   LMP 03/19/2017   BMI 29.83 kg/m     General appearance: alert, cooperative and appears stated age Head: Normocephalic, without obvious abnormality,  atraumatic Neck: no adenopathy, supple, symmetrical, trachea midline and thyroid normal to inspection and palpation Lungs: clear to auscultation bilaterally Heart: regular rate and rhythm Abdomen: soft, non-tender, no masses,  no organomegaly Extremities: extremities normal, atraumatic, no cyanosis or edema Skin: Skin color, texture, turgor normal. No rashes or lesions No abnormal inguinal nodes palpated Neurologic: Grossly normal  Pelvic: External genitalia:  no lesions              Urethra:  normal appearing urethra with no masses, tenderness or lesions              Bartholins and Skenes: normal                 Vagina: normal appearing vagina with normal color and discharge, no lesions              Cervix: no lesions                Bimanual Exam:  Uterus:  normal size, contour, position, consistency, mobility, non-tender              Adnexa: no mass, fullness, tenderness        Chaperone was present for exam.  ASSESSMENT  Abnormal uterine bleeding.  Endometrial polyp.  PLAN  Discussed endometrial polyp and uterine fibroid. Recommended hysteroscopic Myosure resection of endometrial polyp with dilation and curettage.  Risks, benefits, and alternatives reviewed. Risks include but are not limited to bleeding, infection, damage to surrounding organs including uterine perforation requiring hospitalization and laparoscopy, reaction to anesthesia, DVT, PE, death, and need for further treatment. Surgical expectations and recovery discussed.  Patient wishes to proceed. Abstinence of intercourse until 2 weeks after surgery.  Mammogram scheduled.   An After Visit Summary  was printed and given to the patient.  ___25_ minutes face to face time of which over 50% was spent in counseling.

## 2017-04-03 NOTE — Anesthesia Preprocedure Evaluation (Addendum)
Anesthesia Evaluation  Patient identified by MRN, date of birth, ID band Patient awake    Reviewed: Allergy & Precautions, NPO status , Patient's Chart, lab work & pertinent test results  History of Anesthesia Complications Negative for: history of anesthetic complications  Airway Mallampati: II  TM Distance: >3 FB Neck ROM: Full    Dental no notable dental hx. (+) Dental Advisory Given   Pulmonary neg pulmonary ROS,    Pulmonary exam normal        Cardiovascular hypertension, Pt. on medications Normal cardiovascular exam  Study Conclusions  - Left ventricle: The cavity size was normal. Wall thickness was   normal. Systolic function was vigorous. The estimated ejection   fraction was in the range of 65% to 70%. Wall motion was normal;   there were no regional wall motion abnormalities. - Aortic valve: There was mild regurgitation. - Tricuspid valve: There was trivial regurgitation.   Neuro/Psych negative neurological ROS  negative psych ROS   GI/Hepatic Neg liver ROS, GERD  ,  Endo/Other  negative endocrine ROS  Renal/GU negative Renal ROS     Musculoskeletal negative musculoskeletal ROS (+)   Abdominal   Peds  Hematology negative hematology ROS (+)   Anesthesia Other Findings Day of surgery medications reviewed with the patient.  Reproductive/Obstetrics                            Anesthesia Physical Anesthesia Plan  ASA: II  Anesthesia Plan: General   Post-op Pain Management:    Induction: Intravenous  Airway Management Planned: LMA and Oral ETT  Additional Equipment:   Intra-op Plan:   Post-operative Plan: Extubation in OR  Informed Consent: I have reviewed the patients History and Physical, chart, labs and discussed the procedure including the risks, benefits and alternatives for the proposed anesthesia with the patient or authorized representative who has indicated  his/her understanding and acceptance.   Dental advisory given  Plan Discussed with: Anesthesiologist  Anesthesia Plan Comments:        Anesthesia Quick Evaluation

## 2017-04-04 ENCOUNTER — Ambulatory Visit (HOSPITAL_COMMUNITY): Payer: 59 | Admitting: Anesthesiology

## 2017-04-04 ENCOUNTER — Encounter (HOSPITAL_COMMUNITY): Payer: Self-pay

## 2017-04-04 ENCOUNTER — Encounter (HOSPITAL_COMMUNITY)
Admission: RE | Disposition: A | Discharge status: Home / Self Care | Payer: Self-pay | Source: Ambulatory Visit | Attending: Obstetrics and Gynecology

## 2017-04-04 ENCOUNTER — Ambulatory Visit (HOSPITAL_COMMUNITY)
Admission: RE | Admit: 2017-04-04 | Discharge: 2017-04-04 | Disposition: A | Discharge status: Home / Self Care | Payer: 59 | Source: Ambulatory Visit | Attending: Obstetrics and Gynecology | Admitting: Obstetrics and Gynecology

## 2017-04-04 DIAGNOSIS — M542 Cervicalgia: Secondary | ICD-10-CM | POA: Insufficient documentation

## 2017-04-04 DIAGNOSIS — K219 Gastro-esophageal reflux disease without esophagitis: Secondary | ICD-10-CM | POA: Diagnosis not present

## 2017-04-04 DIAGNOSIS — N840 Polyp of corpus uteri: Secondary | ICD-10-CM | POA: Diagnosis not present

## 2017-04-04 DIAGNOSIS — R079 Chest pain, unspecified: Secondary | ICD-10-CM | POA: Insufficient documentation

## 2017-04-04 DIAGNOSIS — Z809 Family history of malignant neoplasm, unspecified: Secondary | ICD-10-CM | POA: Insufficient documentation

## 2017-04-04 DIAGNOSIS — M1990 Unspecified osteoarthritis, unspecified site: Secondary | ICD-10-CM | POA: Insufficient documentation

## 2017-04-04 DIAGNOSIS — Z8249 Family history of ischemic heart disease and other diseases of the circulatory system: Secondary | ICD-10-CM | POA: Diagnosis not present

## 2017-04-04 DIAGNOSIS — M659 Synovitis and tenosynovitis, unspecified: Secondary | ICD-10-CM | POA: Insufficient documentation

## 2017-04-04 DIAGNOSIS — M1712 Unilateral primary osteoarthritis, left knee: Secondary | ICD-10-CM | POA: Insufficient documentation

## 2017-04-04 DIAGNOSIS — E785 Hyperlipidemia, unspecified: Secondary | ICD-10-CM | POA: Insufficient documentation

## 2017-04-04 DIAGNOSIS — D251 Intramural leiomyoma of uterus: Secondary | ICD-10-CM | POA: Insufficient documentation

## 2017-04-04 DIAGNOSIS — I082 Rheumatic disorders of both aortic and tricuspid valves: Secondary | ICD-10-CM | POA: Insufficient documentation

## 2017-04-04 DIAGNOSIS — I10 Essential (primary) hypertension: Secondary | ICD-10-CM | POA: Insufficient documentation

## 2017-04-04 DIAGNOSIS — Z823 Family history of stroke: Secondary | ICD-10-CM | POA: Insufficient documentation

## 2017-04-04 DIAGNOSIS — Z833 Family history of diabetes mellitus: Secondary | ICD-10-CM | POA: Insufficient documentation

## 2017-04-04 DIAGNOSIS — Z79899 Other long term (current) drug therapy: Secondary | ICD-10-CM | POA: Diagnosis not present

## 2017-04-04 DIAGNOSIS — M222X9 Patellofemoral disorders, unspecified knee: Secondary | ICD-10-CM | POA: Diagnosis not present

## 2017-04-04 DIAGNOSIS — S39012A Strain of muscle, fascia and tendon of lower back, initial encounter: Secondary | ICD-10-CM | POA: Diagnosis not present

## 2017-04-04 DIAGNOSIS — X58XXXS Exposure to other specified factors, sequela: Secondary | ICD-10-CM | POA: Insufficient documentation

## 2017-04-04 DIAGNOSIS — N921 Excessive and frequent menstruation with irregular cycle: Secondary | ICD-10-CM | POA: Diagnosis not present

## 2017-04-04 DIAGNOSIS — G43109 Migraine with aura, not intractable, without status migrainosus: Secondary | ICD-10-CM | POA: Insufficient documentation

## 2017-04-04 DIAGNOSIS — N912 Amenorrhea, unspecified: Secondary | ICD-10-CM | POA: Diagnosis not present

## 2017-04-04 DIAGNOSIS — N946 Dysmenorrhea, unspecified: Secondary | ICD-10-CM | POA: Diagnosis not present

## 2017-04-04 HISTORY — PX: DILATATION & CURETTAGE/HYSTEROSCOPY WITH MYOSURE: SHX6511

## 2017-04-04 LAB — PREGNANCY, URINE: PREG TEST UR: NEGATIVE

## 2017-04-04 SURGERY — DILATATION & CURETTAGE/HYSTEROSCOPY WITH MYOSURE
Anesthesia: General | Site: Uterus

## 2017-04-04 MED ORDER — PROPOFOL 10 MG/ML IV BOLUS
INTRAVENOUS | Status: DC | PRN
  Administered 2017-04-04: 150 mg via INTRAVENOUS

## 2017-04-04 MED ORDER — MIDAZOLAM HCL 2 MG/2ML IJ SOLN
INTRAMUSCULAR | Status: DC | PRN
  Administered 2017-04-04: 2 mg via INTRAVENOUS

## 2017-04-04 MED ORDER — GLYCOPYRROLATE 0.2 MG/ML IJ SOLN
INTRAMUSCULAR | Status: AC
  Filled 2017-04-04: qty 1

## 2017-04-04 MED ORDER — MIDAZOLAM HCL 2 MG/2ML IJ SOLN
INTRAMUSCULAR | Status: AC
  Filled 2017-04-04: qty 2

## 2017-04-04 MED ORDER — LACTATED RINGERS IV SOLN
INTRAVENOUS | Status: DC
Start: 2017-04-04 — End: 2017-04-04

## 2017-04-04 MED ORDER — IBUPROFEN 800 MG PO TABS: 800.0000 mg | ORAL_TABLET | Freq: Three times a day (TID) | ORAL | 0 refills | Status: DC | PRN

## 2017-04-04 MED ORDER — SCOPOLAMINE 1 MG/3DAYS TD PT72
1.0000 | MEDICATED_PATCH | Freq: Once | TRANSDERMAL | Status: DC
  Administered 2017-04-04: 1.5 mg via TRANSDERMAL

## 2017-04-04 MED ORDER — LIDOCAINE HCL (CARDIAC) 20 MG/ML IV SOLN
INTRAVENOUS | Status: AC
  Filled 2017-04-04: qty 5

## 2017-04-04 MED ORDER — KETOROLAC TROMETHAMINE 30 MG/ML IJ SOLN
INTRAMUSCULAR | Status: DC | PRN
  Administered 2017-04-04: 30 mg via INTRAVENOUS

## 2017-04-04 MED ORDER — KETOROLAC TROMETHAMINE 30 MG/ML IJ SOLN
INTRAMUSCULAR | Status: AC
  Filled 2017-04-04: qty 1

## 2017-04-04 MED ORDER — ONDANSETRON HCL 4 MG/2ML IJ SOLN
INTRAMUSCULAR | Status: DC | PRN
  Administered 2017-04-04: 4 mg via INTRAVENOUS

## 2017-04-04 MED ORDER — LIDOCAINE HCL (CARDIAC) 20 MG/ML IV SOLN
INTRAVENOUS | Status: DC | PRN
  Administered 2017-04-04: 60 mg via INTRAVENOUS

## 2017-04-04 MED ORDER — SODIUM CHLORIDE 0.9 % IR SOLN
Status: DC | PRN
  Administered 2017-04-04: 3000 mL

## 2017-04-04 MED ORDER — LIDOCAINE HCL 1 % IJ SOLN
INTRAMUSCULAR | Status: AC
  Filled 2017-04-04: qty 20

## 2017-04-04 MED ORDER — FENTANYL CITRATE (PF) 100 MCG/2ML IJ SOLN
INTRAMUSCULAR | Status: DC | PRN
  Administered 2017-04-04: 25 ug via INTRAVENOUS
  Administered 2017-04-04: 50 ug via INTRAVENOUS
  Administered 2017-04-04: 25 ug via INTRAVENOUS

## 2017-04-04 MED ORDER — PROPOFOL 10 MG/ML IV BOLUS
INTRAVENOUS | Status: AC
  Filled 2017-04-04: qty 20

## 2017-04-04 MED ORDER — FENTANYL CITRATE (PF) 100 MCG/2ML IJ SOLN
INTRAMUSCULAR | Status: AC
Start: 2017-04-04 — End: 2017-04-04
  Filled 2017-04-04: qty 2

## 2017-04-04 MED ORDER — DEXAMETHASONE SODIUM PHOSPHATE 10 MG/ML IJ SOLN
INTRAMUSCULAR | Status: AC
  Filled 2017-04-04: qty 1

## 2017-04-04 MED ORDER — SCOPOLAMINE 1 MG/3DAYS TD PT72
MEDICATED_PATCH | TRANSDERMAL | Status: AC
  Administered 2017-04-04: 1.5 mg via TRANSDERMAL
  Filled 2017-04-04: qty 1

## 2017-04-04 MED ORDER — GLYCOPYRROLATE 0.2 MG/ML IJ SOLN
INTRAMUSCULAR | Status: DC | PRN
  Administered 2017-04-04: .2 mg via INTRAVENOUS

## 2017-04-04 MED ORDER — DEXAMETHASONE SODIUM PHOSPHATE 10 MG/ML IJ SOLN
INTRAMUSCULAR | Status: DC | PRN
  Administered 2017-04-04: 10 mg via INTRAVENOUS

## 2017-04-04 MED ORDER — LIDOCAINE HCL 1 % IJ SOLN
INTRAMUSCULAR | Status: DC | PRN
  Administered 2017-04-04: 10 mL

## 2017-04-04 MED ORDER — LACTATED RINGERS IV SOLN
INTRAVENOUS | Status: DC
  Administered 2017-04-04: 08:00:00 via INTRAVENOUS

## 2017-04-04 MED ORDER — ONDANSETRON HCL 4 MG/2ML IJ SOLN
INTRAMUSCULAR | Status: AC
  Filled 2017-04-04: qty 2

## 2017-04-04 SURGICAL SUPPLY — 20 items
CANISTER SUCT 3000ML PPV (MISCELLANEOUS) ×5 IMPLANT
CATH ROBINSON RED A/P 16FR (CATHETERS) ×3 IMPLANT
CLOTH BEACON ORANGE TIMEOUT ST (SAFETY) ×3 IMPLANT
CONTAINER PREFILL 10% NBF 60ML (FORM) ×6 IMPLANT
DEVICE MYOSURE LITE (MISCELLANEOUS) ×2 IMPLANT
DEVICE MYOSURE REACH (MISCELLANEOUS) IMPLANT
ELECT REM PT RETURN 9FT ADLT (ELECTROSURGICAL)
ELECTRODE REM PT RTRN 9FT ADLT (ELECTROSURGICAL) IMPLANT
FILTER ARTHROSCOPY CONVERTOR (FILTER) ×3 IMPLANT
GLOVE BIO SURGEON STRL SZ 6.5 (GLOVE) ×2 IMPLANT
GLOVE BIO SURGEONS STRL SZ 6.5 (GLOVE) ×1
GLOVE BIOGEL PI IND STRL 7.0 (GLOVE) ×2 IMPLANT
GLOVE BIOGEL PI INDICATOR 7.0 (GLOVE) ×4
GOWN STRL REUS W/TWL LRG LVL3 (GOWN DISPOSABLE) ×6 IMPLANT
PACK VAGINAL MINOR WOMEN LF (CUSTOM PROCEDURE TRAY) ×3 IMPLANT
PAD OB MATERNITY 4.3X12.25 (PERSONAL CARE ITEMS) ×3 IMPLANT
SEAL ROD LENS SCOPE MYOSURE (ABLATOR) ×3 IMPLANT
TOWEL OR 17X24 6PK STRL BLUE (TOWEL DISPOSABLE) ×6 IMPLANT
TUBING AQUILEX INFLOW (TUBING) ×3 IMPLANT
TUBING AQUILEX OUTFLOW (TUBING) ×3 IMPLANT

## 2017-04-04 NOTE — Discharge Instructions (Addendum)
Ms. Dettinger,   I found and removed the endometrial polyp.  I also did the curettage of the uterine walls.  Everything went well! The office will contact you after the lab analyzes the specimens.   Call if you need anything.   Josefa Half, MD   Hysteroscopy, Care After Refer to this sheet in the next few weeks. These instructions provide you with information on caring for yourself after your procedure. Your health care provider may also give you more specific instructions. Your treatment has been planned according to current medical practices, but problems sometimes occur. Call your health care provider if you have any problems or questions after your procedure. What can I expect after the procedure? After your procedure, it is typical to have the following:  You may have some cramping. This normally lasts for a couple days.  You may have bleeding. This can vary from light spotting for a few days to menstrual-like bleeding for 3-7 days. Follow these instructions at home:  Rest for the first 1-2 days after the procedure.  Only take over-the-counter or prescription medicines as directed by your health care provider. Do not take aspirin. It can increase the chances of bleeding.  Take showers instead of baths for 2 weeks or as directed by your health care provider.  Do not drive for 24 hours or as directed.  Do not drink alcohol while taking pain medicine.  Do not use tampons, douche, or have sexual intercourse for 2 weeks or until your health care provider says it is okay.  Take your temperature twice a day for 4-5 days. Write it down each time.  Follow your health care provider's advice about diet, exercise, and lifting.  If you develop constipation, you may:  Take a mild laxative if your health care provider approves.  Add bran foods to your diet.  Drink enough fluids to keep your urine clear or pale yellow.  Try to have someone with you or available to you for the first  24-48 hours, especially if you were given a general anesthetic.  Follow up with your health care provider as directed. Contact a health care provider if:  You feel dizzy or lightheaded.  You feel sick to your stomach (nauseous).  You have abnormal vaginal discharge.  You have a rash.  You have pain that is not controlled with medicine. Get help right away if:  You have bleeding that is heavier than a normal menstrual period.  You have a fever.  You have increasing cramps or pain, not controlled with medicine.  You have new belly (abdominal) pain.  You pass out.  You have pain in the tops of your shoulders (shoulder strap areas).  You have shortness of breath. This information is not intended to replace advice given to you by your health care provider. Make sure you discuss any questions you have with your health care provider. Document Released: 09/19/2013 Document Revised: 05/06/2016 Document Reviewed: 06/28/2013 Elsevier Interactive Patient Education  2017 Elsevier [employer]   Dilation and Curettage or Vacuum Curettage, Care After This sheet gives you information about how to care for yourself after your procedure. Your health care provider may also give you more specific instructions. If you have problems or questions, contact your health care provider. What can I expect after the procedure? After your procedure, it is common to have:  Mild pain or cramping.  Some vaginal bleeding or spotting. These may last for up to 2 weeks after your procedure. Follow these instructions  at home: Activity    Do not drive or use heavy machinery while taking prescription pain medicine.  Avoid driving for the first 24 hours after your procedure.  Take frequent, short walks, followed by rest periods, throughout the day. Ask your health care provider what activities are safe for you. After 1-2 days, you may be able to return to your normal activities.  Do not lift anything heavier  than 10 lb (4.5 kg) until your health care provider approves.  For at least 2 weeks, or as long as told by your health care provider, do not:  Douche.  Use tampons.  Have sexual intercourse. General instructions    Take over-the-counter and prescription medicines only as told by your health care provider. This is especially important if you take blood thinning medicine.  Do not take baths, swim, or use a hot tub until your health care provider approves. Take showers instead of baths.  Wear compression stockings as told by your health care provider. These stockings help to prevent blood clots and reduce swelling in your legs.  It is your responsibility to get the results of your procedure. Ask your health care provider, or the department performing the procedure, when your results will be ready.  Keep all follow-up visits as told by your health care provider. This is important. Contact a health care provider if:  You have severe cramps that get worse or that do not get better with medicine.  You have severe abdominal pain.  You cannot drink fluids without vomiting.  You develop pain in a different area of your pelvis.  You have bad-smelling vaginal discharge.  You have a rash. Get help right away if:  You have vaginal bleeding that soaks more than one sanitary pad in 1 hour, for 2 hours in a row.  You pass large blood clots from your vagina.  You have a fever that is above 100.33F (38.0C).  Your abdomen feels very tender or hard.  You have chest pain.  You have shortness of breath.  You cough up blood.  You feel dizzy or light-headed.  You faint.  You have pain in your neck or shoulder area. This information is not intended to replace advice given to you by your health care provider. Make sure you discuss any questions you have with your health care provider. Document Released: 11/26/2000 Document Revised: 07/28/2016 Document Reviewed: 07/01/2016 Elsevier  Interactive Patient Education  2017 [facility] Instructions  NO IBUPROFEN PRODUCTS UNTIL: 3:00 TODAY  Activity: Get plenty of rest for the remainder of the day. A responsible individual must stay with you for 24 hours following the procedure.  For the next 24 hours, DO NOT: -Drive a car -Paediatric nurse -Drink alcoholic beverages -Take any medication unless instructed by your physician -Make any legal decisions or sign important papers.  Meals: Start with liquid foods such as gelatin or soup. Progress to regular foods as tolerated. Avoid greasy, spicy, heavy foods. If nausea and/or vomiting occur, drink only clear liquids until the nausea and/or vomiting subsides. Call your physician if vomiting continues.  Special Instructions/Symptoms: Your throat may feel dry or sore from the anesthesia or the breathing tube placed in your throat during surgery. If this causes discomfort, gargle with warm salt water. The discomfort should disappear within 24 hours.  If you had a scopolamine patch placed behind your ear for the management of post- operative nausea and/or vomiting:  1. The medication in the patch  is effective for 72 hours, after which it should be removed.  Wrap patch in a tissue and discard in the trash. Wash hands thoroughly with soap and water. 2. You may remove the patch earlier than 72 hours if you experience unpleasant side effects which may include dry mouth, dizziness or visual disturbances. 3. Avoid touching the patch. Wash your hands with soap and water after contact with the patch.

## 2017-04-04 NOTE — Op Note (Signed)
OPERATIVE REPORT   PREOPERATIVE DIAGNOSES:   Metrorrhagia, endometrial polyp.  POSTOPERATIVE DIAGNOSES:   Metrorrhagia, endometrial polyp.  PROCEDURE:  Hysteroscopy with dilation and curettage and Myosure resection of endometrial polyp.  SURGEON:  Lenard Galloway, MD  ANESTHESIA:  LMA, paracervical block with 10 mL of 1% lidocaine.  IV FLUIDS:  600 cc LR.  EBL:  5 cc.  URINE OUTPUT:  40 cc.  NORMAL SALINE DEFICIT:  200 cc.  COMPLICATIONS:  None.  INDICATIONS FOR THE PROCEDURE:     The patient is a 46 year old G3P4 African female who presents with irregular vaginal bleeding and sonohysterogram and endometrial biopsy confirming an endometrial polyp.  A plan is now made to proceed with a hysteroscopy with dilation and curettage and Myosure resection of endometrial polyp; after risks, benefits and alternatives were reviewed.  FINDINGS:    The uterus was sounded to 8 cm.  Hysteroscopy showed a 1 cm polyp attached to the right fundal uterine side wall.  Endometrial currettings were minimal.  SPECIMENS:  The endometrial polyp and endometrial curettings were sent to Pathology.  PROCEDURE IN DETAIL:  The patient was reidentified in the preoperative hold area.  She received TED hose and PAS stockings for DVT prophylaxis.  In the operating room, the patient was placed in the dorsal lithotomy position and then an LMA anesthetic was introduced.  The patient's lower abdomen, vagina and perineum were sterilely prepped with Betadine and the  patient's bladder was catheterized of urine.  She was sterilly draped.  A speculum was placed inside the vagina and a single-tooth tenaculum was placed on the anterior cervical lip.  A paracervical block was performed with a total of 10 mL of 1% lidocaine plain.  The uterus was sounded. The cervix was dilated to a #21 Pratt dilator.  The MyoSure hysteroscope was then inserted inside the uterine cavity under the  continuous infusion of normal saline solution.  Findings are as noted above.  The polyp was resected with the Myosure, and the specimen was sent to Pathology. The MyoSure hysteroscope was removed.  The cervix was further dilated to a #23  Pratt dilator.  The sharp and then a serrated curette were introduced into the uterine cavity and the endometrium was curetted in all 4 quadrants.  A minimal amount of endometrial curettings was obtained.  This specimen was sent to Pathology.  The single-tooth tenaculum which had been placed on the anterior cervical lip was removed.  Hemostasis was good, and all of the vaginal instruments  were removed.  The patient was awakened and escorted to the recovery room in stable condition after she was cleansed with Betadine.  There were no complications to the procedure.  All needle, instrument and sponge counts were correct.  Lenard Galloway, MD

## 2017-04-04 NOTE — Brief Op Note (Signed)
04/04/2017  9:02 AM  PATIENT:  Teresa Hickman  46 y.o. female  PRE-OPERATIVE DIAGNOSIS:  Metrorrhagia, endometrial polyp  POST-OPERATIVE DIAGNOSIS:  Metrorrhagia, endometrial polyp  PROCEDURE:  Procedure(s) with comments: DILATATION & CURETTAGE/HYSTEROSCOPY WITH MYOSURE,POLYPECTOMY (N/A) - endometrial polyp  SURGEON:  Surgeon(s) and Role:    * Brook E Yisroel Ramming, MD - Primary  PHYSICIAN ASSISTANT: NA  ASSISTANTS: none   ANESTHESIA:   paracervical block and  LMA.  EBL:  Total I/O In: 600 [I.V.:600] Out: 85 [Urine:80; Blood:5]   NS deficit: 200 cc.  BLOOD ADMINISTERED:none  DRAINS: none   LOCAL MEDICATIONS USED:  LIDOCAINE  and Amount: 10 ml  SPECIMEN:  Source of Specimen:   endometrial polyp, endometrial curettings.  DISPOSITION OF SPECIMEN:  PATHOLOGY  COUNTS:  YES  TOURNIQUET:  * No tourniquets in log *  DICTATION: .Note written in [place]: Discharge to home after PACU  PATIENT DISPOSITION:  PACU - hemodynamically stable.   Delay start of Pharmacological VTE agent (>24hrs) due to surgical blood loss or risk of bleeding: not applicable

## 2017-04-04 NOTE — Progress Notes (Signed)
Update to History and Physical  No marked change in status since office preop visit.  Glucose elevated with preop labs. Will plan for HgbA1C in the office at post op visit.  Patient examined.  Ok to proceed with surgery.

## 2017-04-04 NOTE — Anesthesia Procedure Notes (Signed)
Procedure Name: LMA Insertion Performed by: Duane Boston Pre-anesthesia Checklist: Patient identified, Patient being monitored, Emergency Drugs available, Timeout performed and Suction available Patient Re-evaluated:Patient Re-evaluated prior to inductionOxygen Delivery Method: Circle System Utilized Preoxygenation: Pre-oxygenation with 100% oxygen Intubation Type: IV induction Ventilation: Mask ventilation without difficulty LMA: LMA inserted LMA Size: 4.0 Number of attempts: 1 Placement Confirmation: positive ETCO2 and breath sounds checked- equal and bilateral

## 2017-04-04 NOTE — Anesthesia Postprocedure Evaluation (Addendum)
Anesthesia Post Note  Patient: Teresa Hickman  Procedure(s) Performed: Procedure(s) (LRB): DILATATION & CURETTAGE/HYSTEROSCOPY WITH MYOSURE,POLYPECTOMY (N/A)  Patient location during evaluation: PACU Anesthesia Type: General Level of consciousness: sedated Pain management: pain level controlled Vital Signs Assessment: post-procedure vital signs reviewed and stable Respiratory status: spontaneous breathing and respiratory function stable Cardiovascular status: stable Anesthetic complications: no        Last Vitals:  Vitals:   04/04/17 0945 04/04/17 1015  BP: 127/79 128/85  Pulse: 81 86  Resp: 18 20  Temp:  36.5 C    Last Pain:  Vitals:   04/04/17 1015  TempSrc:   PainSc: 0-No pain   Pain Goal: Patients Stated Pain Goal: 3 (04/04/17 1015)               Duane Boston DANIEL

## 2017-04-04 NOTE — Transfer of Care (Signed)
Immediate Anesthesia Transfer of Care Note  Patient: Teresa Hickman  Procedure(s) Performed: Procedure(s) with comments: DILATATION & CURETTAGE/HYSTEROSCOPY WITH MYOSURE,POLYPECTOMY (N/A) - endometrial polyp  Patient Location: PACU  Anesthesia Type:General  Level of Consciousness: awake, alert  and oriented  Airway & Oxygen Therapy: Patient Spontanous Breathing and Patient connected to nasal cannula oxygen  Post-op Assessment: Report given to RN, Post -op Vital signs reviewed and stable and Patient moving all extremities X 4  Post vital signs: Reviewed and stable  Last Vitals:  Vitals:   04/04/17 0732  BP: (!) 149/89  Pulse: 86  Resp: 16  Temp: 36.8 C    Last Pain:  Vitals:   04/04/17 0732  TempSrc: Oral      Patients Stated Pain Goal: 3 (25/18/98 4210)  Complications: No apparent anesthesia complications

## 2017-04-05 ENCOUNTER — Other Ambulatory Visit: Payer: Self-pay | Admitting: Obstetrics and Gynecology

## 2017-04-05 ENCOUNTER — Encounter (HOSPITAL_COMMUNITY): Payer: Self-pay | Admitting: Obstetrics and Gynecology

## 2017-04-05 DIAGNOSIS — R7309 Other abnormal glucose: Secondary | ICD-10-CM

## 2017-04-12 ENCOUNTER — Ambulatory Visit
Admission: RE | Admit: 2017-04-12 | Discharge: 2017-04-12 | Disposition: A | Discharge status: Home / Self Care | Payer: 59 | Source: Ambulatory Visit | Attending: Obstetrics and Gynecology | Admitting: Obstetrics and Gynecology

## 2017-04-12 DIAGNOSIS — Z1231 Encounter for screening mammogram for malignant neoplasm of breast: Secondary | ICD-10-CM | POA: Diagnosis not present

## 2017-04-14 ENCOUNTER — Encounter: Payer: Self-pay | Admitting: Obstetrics and Gynecology

## 2017-04-14 ENCOUNTER — Ambulatory Visit (INDEPENDENT_AMBULATORY_CARE_PROVIDER_SITE_OTHER): Payer: 59 | Admitting: Obstetrics and Gynecology

## 2017-04-14 ENCOUNTER — Telehealth: Payer: Self-pay | Admitting: Obstetrics and Gynecology

## 2017-04-14 VITALS — BP 138/80 | HR 90 | Temp 98.7°F | Ht 65.5 in | Wt 183.8 lb

## 2017-04-14 DIAGNOSIS — Z9889 Other specified postprocedural states: Secondary | ICD-10-CM | POA: Diagnosis not present

## 2017-04-14 DIAGNOSIS — R8290 Unspecified abnormal findings in urine: Secondary | ICD-10-CM

## 2017-04-14 DIAGNOSIS — R102 Pelvic and perineal pain: Secondary | ICD-10-CM

## 2017-04-14 LAB — CBC WITH DIFFERENTIAL/PLATELET
BASOS ABS: 0 {cells}/uL (ref 0–200)
BASOS PCT: 0 %
EOS ABS: 70 {cells}/uL (ref 15–500)
Eosinophils Relative: 1 %
HEMATOCRIT: 37 % (ref 35.0–45.0)
Hemoglobin: 11.7 g/dL (ref 11.7–15.5)
LYMPHS PCT: 43 %
Lymphs Abs: 3010 cells/uL (ref 850–3900)
MCH: 27.7 pg (ref 27.0–33.0)
MCHC: 31.6 g/dL — ABNORMAL LOW (ref 32.0–36.0)
MCV: 87.7 fL (ref 80.0–100.0)
MONO ABS: 630 {cells}/uL (ref 200–950)
MPV: 10 fL (ref 7.5–12.5)
Monocytes Relative: 9 %
NEUTROS PCT: 47 %
Neutro Abs: 3290 cells/uL (ref 1500–7800)
Platelets: 315 10*3/uL (ref 140–400)
RBC: 4.22 MIL/uL (ref 3.80–5.10)
RDW: 13.9 % (ref 11.0–15.0)
WBC: 7 10*3/uL (ref 3.8–10.8)

## 2017-04-14 LAB — POCT URINALYSIS DIPSTICK
BILIRUBIN UA: NEGATIVE
GLUCOSE UA: NEGATIVE
Ketones, UA: NEGATIVE
NITRITE UA: NEGATIVE
Protein, UA: NEGATIVE
RBC UA: NEGATIVE
Urobilinogen, UA: 0.2 E.U./dL
pH, UA: 5 (ref 5.0–8.0)

## 2017-04-14 MED ORDER — CIPROFLOXACIN HCL 500 MG PO TABS: 500.0000 mg | ORAL_TABLET | Freq: Two times a day (BID) | ORAL | 0 refills | Status: DC

## 2017-04-14 NOTE — Telephone Encounter (Signed)
Spoke with patient. Patient states she had surgery on 4/23 started experiencing pain in lower back and abdomen on 5/2, took motrin felt better. Patient states pain returned again today, took motrin with no relief, pain 6/10. Patient states last BM 5/3, passing gas, denies bleeding, discharge, fever, urinary complaints or nausea. Recommended OV for further evaluation, advised patient to come on into office, will work into Dr. Quincy Simmonds schedule. Patient states she has to get son off of bus at 2:45pm, will come to office after. Patient verbalizes understanding and is agreeable.  Routing to provider for final review. Patient is agreeable to disposition. Will close encounter.

## 2017-04-14 NOTE — Telephone Encounter (Signed)
Patient had surgery on 04/04/17 and is having a lot of pain.

## 2017-04-14 NOTE — Progress Notes (Signed)
GYNECOLOGY  VISIT   HPI: 46 y.o.   Married  [place]  female   [phone] with Patient's last menstrual period was 03/19/2017.   here for lower pelvic pain with right flank/back pain since yesterday. Patient denies any urinary symptoms.  No urgency or dysuria. Taking Motrin 800 po q 8 hours and the pain resolves but then it comes back.  Keeps patient awake.  No fevers.  No bleeding.  Last BM was this am and felt a little better after this.  No diarrhea. Denies nausea and vomiting. Good appetite.  Status post hysteroscopic Myosure polypectomy with dilation and curettage on 04/04/17. Path showed benign polyp and proliferative endometrium.  Had an abdominal ultrasound and this was normal on 03/22/17. Had hepatobiliary imaging which was normal on 03/22/17.   Patient is midcycle right now.   Urine Dip: 1+WBCs Temp: 98.7  GYNECOLOGIC HISTORY: Patient's last menstrual period was 03/19/2017. Contraception: none  Menopausal hormone therapy:  none Last mammogram: 04-12-17 Density C/Neg/biRads1:TBC Last pap smear: 12-22-16 Neg:Neg HR HPV; 03-01-13 Neg:Neg HR HPV        OB History    Gravida Para Term Preterm AB Living   4 4       4    SAB TAB Ectopic Multiple Live Births           4         Patient Active Problem List   Diagnosis Date Noted  . Neck pain 06/21/2016  . Migraine headache without aura 06/21/2016  . 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 Cervical paraspinal muscle spasm 06/09/2016  . Chest pain 04/12/2016  . Arthritis of left lower extremity 05/29/2015  . Synovitis of knee 05/09/2015  . Fracture of first metatarsal bone 01/09/2015  . Right foot pain 12/27/2014  . Essential hypertension 12/27/2014  . Patellofemoral pain syndrome 08/12/2014  . Patellar pain 08/01/2014  . Pain of left lower extremity 08/01/2014  . Hx of migraine with aura 06/18/2013  . Elevated blood pressure reading 06/18/2013  . Bilateral anterior knee pain 09/23/2012  . Low back pain 09/23/2012   . Contraception management 03/18/2011  . Irregular menses 03/18/2011  . ELEVATED BLOOD PRESSURE WITHOUT DIAGNOSIS OF HYPERTENSION 03/02/2010  . AMENORRHEA 10/16/2008  . Hyperlipidemia 05/01/2008  . MIGRAINE HEADACHE 05/01/2008  . HEADACHE 05/01/2008  . LUMBAR STRAIN 05/01/2008    Past Medical History:  Diagnosis Date  . Arthritis   . Dysmenorrhea   . Essential hypertension 12/27/2014  . Fibroid 12/23/2016   1 cm   . GERD (gastroesophageal reflux disease)   . Hyperlipidemia   . Migraine    with some aura    Past Surgical History:  Procedure Laterality Date  . DILATATION & CURETTAGE/HYSTEROSCOPY WITH MYOSURE N/A 04/04/2017   Procedure: DILATATION & CURETTAGE/HYSTEROSCOPY WITH MYOSURE,POLYPECTOMY;  Surgeon: Nunzio Cobbs, MD;  Location: [facility];  Service: Gynecology;  Laterality: N/A;  endometrial polyp  . MOUTH SURGERY    . NO PAST SURGERIES      Current Outpatient Prescriptions  Medication Sig Dispense Refill  . acetaminophen (TYLENOL) 500 MG tablet Take 1,000 mg by mouth every 6 (six) hours as needed for moderate pain or headache.    . calcium carbonate (TUMS - DOSED IN MG ELEMENTAL CALCIUM) 500 MG chewable tablet Chew 1 tablet by mouth daily as needed for indigestion or heartburn.    . hydrochlorothiazide (HYDRODIURIL) 25 MG tablet Take 1 tablet (25 mg total) by mouth daily. (Patient taking differently: Take 12.5 mg by  mouth daily. ) 90 tablet 0  . ibuprofen (ADVIL,MOTRIN) 800 MG tablet Take 1 tablet (800 mg total) by mouth every 8 (eight) hours as needed. 30 tablet 0  . omeprazole (PRILOSEC) 20 MG capsule Take 1 capsule (20 mg total) by mouth daily. (Patient taking differently: Take 20 mg by mouth daily as needed (acid reflux). ) 30 capsule 0   No current facility-administered medications for this visit.      ALLERGIES: Patient has no known allergies.  Family History  Problem Relation Age of Onset  . Hypertension Mother   . Diabetes Mother   . Stroke Mother      29s [place]  2010  . Cancer Father     Social History   Social History  . Marital status: Married    Spouse name: Lawal  . Number of children: 4  . Years of education: 16   Occupational History  . Unemployed    Social History Main Topics  . Smoking status: Never Smoker  . Smokeless tobacco: Never Used  . Alcohol use No  . Drug use: No  . Sexual activity: Yes    Partners: Male    Birth control/ protection: None   Other Topics Concern  . Not on file   Social History Narrative   Lives with husband and son, [name]   From [place]   Medical Family   HH of 6    No pets   G4 p4    Caffeine use: 1 cup coffee per day    ROS:  Pertinent items are noted in HPI.  PHYSICAL EXAMINATION:    BP 138/80 (BP Location: Left Arm, Patient Position: Sitting, Cuff Size: Normal)   Pulse 90   Temp 98.7 F (37.1 C) (Oral)   Ht 5' 5.5" (1.664 m)   Wt 183 lb 12.8 oz (83.4 kg)   LMP 03/19/2017   BMI 30.12 kg/m     General appearance: alert, cooperative and appears stated age Head: Normocephalic, without obvious abnormality, atraumatic Neck: no adenopathy, supple, symmetrical, trachea midline and thyroid normal to inspection and palpation Lungs: clear to auscultation bilaterally Heart: regular rate and rhythm Abdomen: soft, non-tender, no masses,  no organomegaly Extremities: extremities normal, atraumatic, no cyanosis or edema Skin: Skin color, texture, turgor normal. No rashes or lesions   Pelvic: External genitalia:  no lesions              Urethra:  normal appearing urethra with no masses, tenderness or lesions              Bartholins and Skenes: normal                 Vagina: normal appearing vagina with normal color and discharge, no lesions              Cervix: no lesions.  No CMT.                Bimanual Exam:  Uterus:  normal size, contour, position, consistency, mobility, non-tender              Adnexa: no mass, fullness, tenderness              Chaperone was present for  exam.  ASSESSMENT  Pelvic pain.  Abnormal urine.  Status post hysteroscopic resection of endometrial polyp and dilation and curettage. I suspect UTI and not endometritis.   PLAN  Urine micro and culture.  CBC with diff.  Will start Ciprofloxacin 500 mg po bid for  one week.  Ok for Motrin 800 mg po q 8 hours prn pain. Surgical pathology reviewed with patient.  Follow up in one week, sooner as needed.  An After Visit Summary was printed and given to the patient.  __15____ minutes face to face time of which over 50% was spent in counseling.

## 2017-04-14 NOTE — Patient Instructions (Signed)
Call for worsening pain, fever, nausea and vomiting, or increasing back pain.   You may continue the Motrin 800 mg by mouth every 8 hours as needed for pain.

## 2017-04-15 ENCOUNTER — Ambulatory Visit: Payer: 59 | Admitting: Obstetrics and Gynecology

## 2017-04-15 LAB — URINALYSIS, MICROSCOPIC ONLY
BACTERIA UA: NONE SEEN [HPF]
CASTS: NONE SEEN [LPF]
Crystals: NONE SEEN [HPF]
Yeast: NONE SEEN [HPF]

## 2017-04-15 LAB — URINE CULTURE

## 2017-04-18 ENCOUNTER — Telehealth: Payer: Self-pay

## 2017-04-18 NOTE — Telephone Encounter (Signed)
-----   Message from Nunzio Cobbs, MD sent at 04/18/2017 10:16 AM EDT ----- Please contact patient back about her final urine culture.  This is negative for infection.  I would like for her to complete the course of the Cipro. She may have had some mild infection in the uterus following the hysteroscopy.  Please let me know how she is doing.  I did ask for her to come in this week to see me in follow up.

## 2017-04-18 NOTE — Telephone Encounter (Signed)
Spoke with patient. Advised of results and message as seen below from [place]. Patient verbalizes understanding. Patient states she is feeling much better since starting Cipro. Will complete full course of medication. Patient aware she will need to keep follow up as scheduled for 04/22/2017 with Dr.Silva.  Routing to provider for final review. Patient agreeable to disposition. Will close encounter.

## 2017-04-21 NOTE — Progress Notes (Signed)
GYNECOLOGY  VISIT   HPI: 46 y.o.   Married  [place]  female   [phone] with Patient's last menstrual period was 04/15/2017.   here for 2 week follow up [place] (N/A Uterus).    Seen last week for lower pelvic pain and right flank pain and was treated for UTI with Cipro. UC was negative.   Started her menses the day after she had her visit for the pain.  Motrin helped. The flow was light.   No fevers.  GYNECOLOGIC HISTORY: Patient's last menstrual period was 04/15/2017. Contraception:  none Menopausal hormone therapy:  n/a Last mammogram:  04-12-17 Density C/Neg/biRads1:TBC Last pap smear: 12-22-16 Neg:Neg HR HPV; 03-01-13 Neg:Neg HR HPV        OB History    Gravida Para Term Preterm AB Living   4 4       4    SAB TAB Ectopic Multiple Live Births           4         Patient Active Problem List   Diagnosis Date Noted  . Neck pain 06/21/2016  . Migraine headache without aura 06/21/2016  . 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 Cervical paraspinal muscle spasm 06/09/2016  . Chest pain 04/12/2016  . Arthritis of left lower extremity 05/29/2015  . Synovitis of knee 05/09/2015  . Fracture of first metatarsal bone 01/09/2015  . Right foot pain 12/27/2014  . Essential hypertension 12/27/2014  . Patellofemoral pain syndrome 08/12/2014  . Patellar pain 08/01/2014  . Pain of left lower extremity 08/01/2014  . Hx of migraine with aura 06/18/2013  . Elevated blood pressure reading 06/18/2013  . Bilateral anterior knee pain 09/23/2012  . Low back pain 09/23/2012  . Contraception management 03/18/2011  . Irregular menses 03/18/2011  . ELEVATED BLOOD PRESSURE WITHOUT DIAGNOSIS OF HYPERTENSION 03/02/2010  . AMENORRHEA 10/16/2008  . Hyperlipidemia 05/01/2008  . MIGRAINE HEADACHE 05/01/2008  . HEADACHE 05/01/2008  . LUMBAR STRAIN 05/01/2008    Past Medical History:  Diagnosis Date  . Arthritis   . Dysmenorrhea   .  Essential hypertension 12/27/2014  . Fibroid 12/23/2016   1 cm   . GERD (gastroesophageal reflux disease)   . Hyperlipidemia   . Migraine    with some aura    Past Surgical History:  Procedure Laterality Date  . DILATATION & CURETTAGE/HYSTEROSCOPY WITH MYOSURE N/A 04/04/2017   Procedure: DILATATION & CURETTAGE/HYSTEROSCOPY WITH MYOSURE,POLYPECTOMY;  Surgeon: Nunzio Cobbs, MD;  Location: [facility];  Service: Gynecology;  Laterality: N/A;  endometrial polyp  . MOUTH SURGERY    . NO PAST SURGERIES      Current Outpatient Prescriptions  Medication Sig Dispense Refill  . acetaminophen (TYLENOL) 500 MG tablet Take 1,000 mg by mouth every 6 (six) hours as needed for moderate pain or headache.    . calcium carbonate (TUMS - DOSED IN MG ELEMENTAL CALCIUM) 500 MG chewable tablet Chew 1 tablet by mouth daily as needed for indigestion or heartburn.    . ciprofloxacin (CIPRO) 500 MG tablet Take 1 tablet (500 mg total) by mouth 2 (two) times daily. 14 tablet 0  . hydrochlorothiazide (HYDRODIURIL) 25 MG tablet Take 1 tablet (25 mg total) by mouth daily. (Patient taking differently: Take 12.5 mg by mouth daily. ) 90 tablet 0  . ibuprofen (ADVIL,MOTRIN) 800 MG tablet Take 1 tablet (800 mg total) by mouth every 8 (eight) hours as needed. 30 tablet 0  . omeprazole (PRILOSEC) 20  MG capsule Take 1 capsule (20 mg total) by mouth daily. (Patient taking differently: Take 20 mg by mouth daily as needed (acid reflux). ) 30 capsule 0   No current facility-administered medications for this visit.      ALLERGIES: Patient has no known allergies.  Family History  Problem Relation Age of Onset  . Hypertension Mother   . Diabetes Mother   . Stroke Mother        81s [place]  2010  . Cancer Father     Social History   Social History  . Marital status: Married    Spouse name: Lawal  . Number of children: 4  . Years of education: 16   Occupational History  . Unemployed    Social History Main Topics   . Smoking status: Never Smoker  . Smokeless tobacco: Never Used  . Alcohol use No  . Drug use: No  . Sexual activity: Yes    Partners: Male    Birth control/ protection: None   Other Topics Concern  . Not on file   Social History Narrative   Lives with husband and son, [name]   From [place]   Medical Family   HH of 6    No pets   G4 p4    Caffeine use: 1 cup coffee per day    ROS:  Pertinent items are noted in HPI.  PHYSICAL EXAMINATION:    BP 128/72 (BP Location: Right Arm, Patient Position: Sitting, Cuff Size: Normal)   Pulse 80   Resp 16   Wt 179 lb (81.2 kg)   LMP 04/15/2017   BMI 29.33 kg/m     General appearance: alert, cooperative and appears stated age    Pelvic: External genitalia:  no lesions              Urethra:  normal appearing urethra with no masses, tenderness or lesions              Bartholins and Skenes: normal                 Vagina: normal appearing vagina with normal color and discharge, no lesions              Cervix: no lesions                Bimanual Exam:  Uterus:  normal size, contour, position, consistency, mobility, non-tender              Adnexa: no mass, fullness, tenderness   Chaperone was present for exam.  ASSESSMENT  Status post hysteroscopy with polypectomy, dilation and curettage.  Pelvic pain attributed to menses.  No UTI.  No endometritis.   PLAN  Return to routine GYN care with PCP.  Please call for bleeding abnormalities - heavy bleeding, prolonged bleeding, or cycles less than every 21 days.    An After Visit Summary was printed and given to the patient.  Addendum:  Patient had a slightly elevated glucose with her preop labs.  She will need a hemoglobin A1C drawn.  This was not done before she left the office today, so she will be called to return to do this.

## 2017-04-22 ENCOUNTER — Ambulatory Visit (INDEPENDENT_AMBULATORY_CARE_PROVIDER_SITE_OTHER): Payer: 59 | Admitting: Obstetrics and Gynecology

## 2017-04-22 ENCOUNTER — Encounter: Payer: Self-pay | Admitting: Obstetrics and Gynecology

## 2017-04-22 ENCOUNTER — Telehealth: Payer: Self-pay | Admitting: Obstetrics and Gynecology

## 2017-04-22 VITALS — BP 128/72 | HR 80 | Resp 16 | Wt 179.0 lb

## 2017-04-22 DIAGNOSIS — Z9889 Other specified postprocedural states: Secondary | ICD-10-CM

## 2017-04-22 NOTE — Telephone Encounter (Signed)
Spoke with patient, advised as seen below per Dr. Quincy Simmonds. Patient scheduled for lab appointment on 04/25/17 at 9:45am. Patient verbalizes understanding and is agreeable.  Routing to provider for final review. Patient is agreeable to disposition. Will close encounter.

## 2017-04-22 NOTE — Telephone Encounter (Signed)
Please contact patient to have her follow up for a lab visit to have her hemoglobin A1c level drawn.  Her glucose was elevated with her preop labs.   I am sorry that I forgot this at her post op visit today.

## 2017-04-25 ENCOUNTER — Other Ambulatory Visit (INDEPENDENT_AMBULATORY_CARE_PROVIDER_SITE_OTHER): Payer: 59

## 2017-04-25 DIAGNOSIS — R7309 Other abnormal glucose: Secondary | ICD-10-CM

## 2017-04-26 LAB — HEMOGLOBIN A1C
Hgb A1c MFr Bld: 5.6 % (ref <5.7)
MEAN PLASMA GLUCOSE: 114 mg/dL

## 2017-05-14 NOTE — Addendum Note (Signed)
Addendum  created 05/14/17 0835 by Duane Boston, MD   Sign clinical note

## 2017-06-30 DIAGNOSIS — M1712 Unilateral primary osteoarthritis, left knee: Secondary | ICD-10-CM | POA: Diagnosis not present

## 2017-06-30 DIAGNOSIS — M25562 Pain in left knee: Secondary | ICD-10-CM | POA: Diagnosis not present

## 2017-06-30 DIAGNOSIS — M25462 Effusion, left knee: Secondary | ICD-10-CM | POA: Diagnosis not present

## 2017-07-06 DIAGNOSIS — M25562 Pain in left knee: Secondary | ICD-10-CM | POA: Diagnosis not present

## 2017-07-06 DIAGNOSIS — M1712 Unilateral primary osteoarthritis, left knee: Secondary | ICD-10-CM | POA: Diagnosis not present

## 2017-07-19 DIAGNOSIS — R140 Abdominal distension (gaseous): Secondary | ICD-10-CM | POA: Diagnosis not present

## 2017-07-19 DIAGNOSIS — K5904 Chronic idiopathic constipation: Secondary | ICD-10-CM | POA: Diagnosis not present

## 2017-08-04 DIAGNOSIS — K5904 Chronic idiopathic constipation: Secondary | ICD-10-CM | POA: Diagnosis not present

## 2017-08-04 DIAGNOSIS — K219 Gastro-esophageal reflux disease without esophagitis: Secondary | ICD-10-CM | POA: Diagnosis not present

## 2017-09-02 ENCOUNTER — Encounter: Payer: Self-pay | Admitting: Internal Medicine

## 2017-09-12 ENCOUNTER — Ambulatory Visit (INDEPENDENT_AMBULATORY_CARE_PROVIDER_SITE_OTHER): Payer: 59 | Admitting: Internal Medicine

## 2017-09-12 ENCOUNTER — Encounter: Payer: Self-pay | Admitting: Internal Medicine

## 2017-09-12 VITALS — BP 124/82 | HR 79 | Temp 98.3°F | Ht 65.5 in | Wt 180.2 lb

## 2017-09-12 DIAGNOSIS — M25512 Pain in left shoulder: Secondary | ICD-10-CM

## 2017-09-12 DIAGNOSIS — M25511 Pain in right shoulder: Secondary | ICD-10-CM

## 2017-09-12 DIAGNOSIS — Z23 Encounter for immunization: Secondary | ICD-10-CM

## 2017-09-12 DIAGNOSIS — R0789 Other chest pain: Secondary | ICD-10-CM | POA: Diagnosis not present

## 2017-09-12 MED ORDER — DICLOFENAC SODIUM 75 MG PO TBEC: 75.0000 mg | DELAYED_RELEASE_TABLET | Freq: Two times a day (BID) | ORAL | 0 refills | Status: DC

## 2017-09-12 NOTE — Patient Instructions (Addendum)
This discomfort seems to be related to your shoulder girdle and perhaps costochondral inflammation of the chest wall.I cannot explain the pattern that you have but we will change  anti-inflammatory for the next 10 days   You will be contacted about a referral appointment for sports medicine.  Consideration of getting a chest x-ray butI  Will opine to the sports medicine team about appropriate  and shoulder.   Costochondritis Costochondritis is swelling and irritation (inflammation) of the tissue (cartilage) that connects your ribs to your breastbone (sternum). This causes pain in the front of your chest. The pain usually starts gradually and involves more than one rib. What are the causes? The exact cause of this condition is not always known. It results from stress on the cartilage where your ribs attach to your sternum. The cause of this stress could be:  Chest injury (trauma).  Exercise or activity, such as lifting.  Severe coughing.  What increases the risk? You may be at higher risk for this condition if you:  Are female.  Are 47?47 years old.  Recently started a new exercise or work activity.  Have low levels of vitamin D.  Have a condition that makes you cough frequently.  What are the signs or symptoms? The main symptom of this condition is chest pain. The pain:  Usually starts gradually and can be sharp or dull.  Gets worse with deep breathing, coughing, or exercise.  Gets better with rest.  May be worse when you press on the sternum-rib connection (tenderness).  How is this diagnosed? This condition is diagnosed based on your symptoms, medical history, and a physical exam. Your health care provider will check for tenderness when pressing on your sternum. This is the most important finding. You may also have tests to rule out other causes of chest pain. These may include:  A chest X-ray to check for lung problems.  An electrocardiogram (ECG) to see if you have  a heart problem that could be causing the pain.  An imaging scan to rule out a chest or rib fracture.  How is this treated? This condition usually goes away on its own over time. Your health care provider may prescribe an NSAID to reduce pain and inflammation. Your health care provider may also suggest that you:  Rest and avoid activities that make pain worse.  Apply heat or cold to the area to reduce pain and inflammation.  Do exercises to stretch your chest muscles.  If these treatments do not help, your health care provider may inject a numbing medicine at the sternum-rib connection to help relieve the pain. Follow these instructions at home:  Avoid activities that make pain worse. This includes any activities that use chest, abdominal, and side muscles.  If directed, put ice on the painful area: ? Put ice in a plastic bag. ? Place a towel between your skin and the bag. ? Leave the ice on for 20 minutes, 2-3 times a day.  If directed, apply heat to the affected area as often as told by your health care provider. Use the heat source that your health care provider recommends, such as a moist heat pack or a heating pad. ? Place a towel between your skin and the heat source. ? Leave the heat on for 20-30 minutes. ? Remove the heat if your skin turns bright red. This is especially important if you are unable to feel pain, heat, or cold. You may have a greater risk of getting burned.  Take over-the-counter and prescription medicines only as told by your health care provider.  Return to your normal activities as told by your health care provider. Ask your health care provider what activities are safe for you.  Keep all follow-up visits as told by your health care provider. This is important. Contact a health care provider if:  You have chills or a fever.  Your pain does not go away or it gets worse.  You have a cough that does not go away (is persistent). Get help right away  if:  You have shortness of breath. This information is not intended to replace advice given to you by your health care provider. Make sure you discuss any questions you have with your health care provider. Document Released: 09/08/2005 Document Revised: 06/18/2016 Document Reviewed: 03/24/2016 Elsevier Interactive Patient Education  2018 [employer].    Shoulder Pain Many things can cause shoulder pain, including:  An injury to the area.  Overuse of the shoulder.  Arthritis.  The source of the pain can be:  Inflammation.  An injury to the shoulder joint.  An injury to a tendon, ligament, or bone.  Follow these instructions at home: Take these actions to help with your pain:  Squeeze a soft ball or a foam pad as much as possible. This helps to keep the shoulder from swelling. It also helps to strengthen the arm.  Take over-the-counter and prescription medicines only as told by your health care provider.  If directed, apply ice to the area: ? Put ice in a plastic bag. ? Place a towel between your skin and the bag. ? Leave the ice on for 20 minutes, 2-3 times per day. Stop applying ice if it does not help with the pain.  If you were given a shoulder sling or immobilizer: ? Wear it as told. ? Remove it to shower or bathe. ? Move your arm as little as possible, but keep your hand moving to prevent swelling.  Contact a health care provider if:  Your pain gets worse.  Your pain is not relieved with medicines.  New pain develops in your arm, hand, or fingers. Get help right away if:  Your arm, hand, or fingers: ? Tingle. ? Become numb. ? Become swollen. ? Become painful. ? Turn white or blue. This information is not intended to replace advice given to you by your health care provider. Make sure you discuss any questions you have with your health care provider. Document Released: 09/08/2005 Document Revised: 07/25/2016 Document Reviewed: 03/24/2015 Elsevier  Interactive Patient Education  2017 [employer].

## 2017-09-12 NOTE — Progress Notes (Signed)
Chief Complaint  Patient presents with  . Acute Visit    Pt here c/o pain in both shoulder, limited ROM in both arms    HPI: Teresa Hickman 46 y.o.   fo rpain in shoulders  She has had a history of upper anterior chest pain related possibly to shoulders in the past and has seen cardiology who is ruled out a Cardiologic problem. Since that time it had gotten better but it noticed difficulty around the time of her periods. However over the last month his discomfort came back before the onset of her period she points to upper mid chest parasternal but also shoulder difficulties discomfort range of motion right more than left. She denies any fever shortness of breath cardiac symptoms. No specific lifting she is right hand eatingis been taking ibuprofen 800 every 8 hours with suboptimal success.  ROS: See pertinent positives and negatives per HPI.  Past Medical History:  Diagnosis Date  . Arthritis   . Dysmenorrhea   . Essential hypertension 12/27/2014  . Fibroid 12/23/2016   1 cm   . GERD (gastroesophageal reflux disease)   . Hyperlipidemia   . Migraine    with some aura    Family History  Problem Relation Age of Onset  . Hypertension Mother   . Diabetes Mother   . Stroke Mother        51s [place]  2010  . Cancer Father     Social History   Social History  . Marital status: Married    Spouse name: Lawal  . Number of children: 4  . Years of education: 16   Occupational History  . Unemployed    Social History Main Topics  . Smoking status: Never Smoker  . Smokeless tobacco: Never Used  . Alcohol use No  . Drug use: No  . Sexual activity: Yes    Partners: Male    Birth control/ protection: None   Other Topics Concern  . None   Social History Narrative   Lives with husband and son, [name]   From [place]   Medical Family   HH of 6    No pets   G4 p4    Caffeine use: 1 cup coffee per day    Outpatient Medications Prior to Visit  Medication Sig Dispense Refill    . acetaminophen (TYLENOL) 500 MG tablet Take 1,000 mg by mouth every 6 (six) hours as needed for moderate pain or headache.    . calcium carbonate (TUMS - DOSED IN MG ELEMENTAL CALCIUM) 500 MG chewable tablet Chew 1 tablet by mouth daily as needed for indigestion or heartburn.    . ciprofloxacin (CIPRO) 500 MG tablet Take 1 tablet (500 mg total) by mouth 2 (two) times daily. 14 tablet 0  . hydrochlorothiazide (HYDRODIURIL) 25 MG tablet Take 1 tablet (25 mg total) by mouth daily. (Patient taking differently: Take 12.5 mg by mouth daily. ) 90 tablet 0  . ibuprofen (ADVIL,MOTRIN) 800 MG tablet Take 1 tablet (800 mg total) by mouth every 8 (eight) hours as needed. 30 tablet 0  . omeprazole (PRILOSEC) 20 MG capsule Take 1 capsule (20 mg total) by mouth daily. (Patient taking differently: Take 20 mg by mouth daily as needed (acid reflux). ) 30 capsule 0   No facility-administered medications prior to visit.      EXAM:  BP 124/82 (BP Location: Left Arm, Patient Position: Sitting, Cuff Size: Normal)   Pulse 79   Temp 98.3 F (36.8 C) (  Oral)   Ht 5' 5.5" (1.664 m)   Wt 180 lb 3.2 oz (81.7 kg)   LMP 09/07/2017   SpO2 96%   BMI 29.53 kg/m   Body mass index is 29.53 kg/m.  GENERAL: vitals reviewed and listed above, alert, oriented, appears well hydrated and in no acute distress HEENT: atraumatic, conjunctiva  clear, no obvious abnormalities on inspection of external nose and ears  NECK: no obvious masses on inspection palpation  LUNGS: clear to auscultation bilaterally, no wheezes, rales or rhonchi, good air movement Seems to have some tenderness in the anterior chest wall costochondral area bilaterally upper. No breast pain. CV: HRRR, no clubbing cyanosis or  peripheral edema nl cap refill  MS: moves all extremities right shoulder decreased range of motion internal rotation elevation secondary to discomfort. Left shoulder has better range of motion but still uncomfortable on elevation and  internal rotation. PSYCH: pleasant and cooperative, no obvious depression or anxiety Wt Readings from Last 3 Encounters:  09/12/17 180 lb 3.2 oz (81.7 kg)  04/22/17 179 lb (81.2 kg)  04/14/17 183 lb 12.8 oz (83.4 kg)   BP Readings from Last 3 Encounters:  09/12/17 124/82  04/22/17 128/72  04/14/17 138/80   Lab Results  Component Value Date   WBC 7.0 04/14/2017   HGB 11.7 04/14/2017   HCT 37.0 04/14/2017   PLT 315 04/14/2017   GLUCOSE 126 (H) 03/30/2017   CHOL 218 (H) 06/25/2016   TRIG 75.0 06/25/2016   HDL 51.30 06/25/2016   LDLDIRECT 176.4 10/09/2008   LDLCALC 152 (H) 06/25/2016   ALT 25 08/22/2016   AST 24 08/22/2016   NA 139 03/30/2017   K 3.6 03/30/2017   CL 102 03/30/2017   CREATININE 0.51 03/30/2017   BUN 8 03/30/2017   CO2 30 03/30/2017   TSH 2.31 09/03/2015   HGBA1C 5.6 04/25/2017    ASSESSMENT AND PLAN:  Discussed the following assessment and plan:  Bilateral shoulder pain, unspecified chronicity - r more than left  - Plan: Ambulatory referral to Sports Medicine  Chest wall pain - curious catemenial association? not breast tissue - Plan: Ambulatory referral to Sports Medicine  Need for prophylactic vaccination and inoculation against influenza - Plan: Flu Vaccine QUAD 36+ mos IM (Fluarix & Fluzone Quad PF Unusual presentation as she relates that to periods catamenial but no obvious injury  Right sided more than left    prob ms  Causes   Trial new nsaid with  GI caution  And  Refer to sports medicine -Patient advised to return or notify health care team  if symptoms worsen ,persist or new concerns arise.  Patient Instructions  This discomfort seems to be related to your shoulder girdle and perhaps costochondral inflammation of the chest wall.I cannot explain the pattern that you have but we will change  anti-inflammatory for the next 10 days   You will be contacted about a referral appointment for sports medicine.  Consideration of getting a chest x-ray  butI  Will opine to the sports medicine team about appropriate  and shoulder.   Costochondritis Costochondritis is swelling and irritation (inflammation) of the tissue (cartilage) that connects your ribs to your breastbone (sternum). This causes pain in the front of your chest. The pain usually starts gradually and involves more than one rib. What are the causes? The exact cause of this condition is not always known. It results from stress on the cartilage where your ribs attach to your sternum. The cause of this stress  could be:  Chest injury (trauma).  Exercise or activity, such as lifting.  Severe coughing.  What increases the risk? You may be at higher risk for this condition if you:  Are female.  Are 65?47 years old.  Recently started a new exercise or work activity.  Have low levels of vitamin D.  Have a condition that makes you cough frequently.  What are the signs or symptoms? The main symptom of this condition is chest pain. The pain:  Usually starts gradually and can be sharp or dull.  Gets worse with deep breathing, coughing, or exercise.  Gets better with rest.  May be worse when you press on the sternum-rib connection (tenderness).  How is this diagnosed? This condition is diagnosed based on your symptoms, medical history, and a physical exam. Your health care provider will check for tenderness when pressing on your sternum. This is the most important finding. You may also have tests to rule out other causes of chest pain. These may include:  A chest X-ray to check for lung problems.  An electrocardiogram (ECG) to see if you have a heart problem that could be causing the pain.  An imaging scan to rule out a chest or rib fracture.  How is this treated? This condition usually goes away on its own over time. Your health care provider may prescribe an NSAID to reduce pain and inflammation. Your health care provider may also suggest that you:  Rest and avoid  activities that make pain worse.  Apply heat or cold to the area to reduce pain and inflammation.  Do exercises to stretch your chest muscles.  If these treatments do not help, your health care provider may inject a numbing medicine at the sternum-rib connection to help relieve the pain. Follow these instructions at home:  Avoid activities that make pain worse. This includes any activities that use chest, abdominal, and side muscles.  If directed, put ice on the painful area: ? Put ice in a plastic bag. ? Place a towel between your skin and the bag. ? Leave the ice on for 20 minutes, 2-3 times a day.  If directed, apply heat to the affected area as often as told by your health care provider. Use the heat source that your health care provider recommends, such as a moist heat pack or a heating pad. ? Place a towel between your skin and the heat source. ? Leave the heat on for 20-30 minutes. ? Remove the heat if your skin turns bright red. This is especially important if you are unable to feel pain, heat, or cold. You may have a greater risk of getting burned.  Take over-the-counter and prescription medicines only as told by your health care provider.  Return to your normal activities as told by your health care provider. Ask your health care provider what activities are safe for you.  Keep all follow-up visits as told by your health care provider. This is important. Contact a health care provider if:  You have chills or a fever.  Your pain does not go away or it gets worse.  You have a cough that does not go away (is persistent). Get help right away if:  You have shortness of breath. This information is not intended to replace advice given to you by your health care provider. Make sure you discuss any questions you have with your health care provider. Document Released: 09/08/2005 Document Revised: 06/18/2016 Document Reviewed: 03/24/2016 Elsevier Interactive Patient Education  2018 [place].    Shoulder Pain Many things can cause shoulder pain, including:  An injury to the area.  Overuse of the shoulder.  Arthritis.  The source of the pain can be:  Inflammation.  An injury to the shoulder joint.  An injury to a tendon, ligament, or bone.  Follow these instructions at home: Take these actions to help with your pain:  Squeeze a soft ball or a foam pad as much as possible. This helps to keep the shoulder from swelling. It also helps to strengthen the arm.  Take over-the-counter and prescription medicines only as told by your health care provider.  If directed, apply ice to the area: ? Put ice in a plastic bag. ? Place a towel between your skin and the bag. ? Leave the ice on for 20 minutes, 2-3 times per day. Stop applying ice if it does not help with the pain.  If you were given a shoulder sling or immobilizer: ? Wear it as told. ? Remove it to shower or bathe. ? Move your arm as little as possible, but keep your hand moving to prevent swelling.  Contact a health care provider if:  Your pain gets worse.  Your pain is not relieved with medicines.  New pain develops in your arm, hand, or fingers. Get help right away if:  Your arm, hand, or fingers: ? Tingle. ? Become numb. ? Become swollen. ? Become painful. ? Turn white or blue. This information is not intended to replace advice given to you by your health care provider. Make sure you discuss any questions you have with your health care provider. Document Released: 09/08/2005 Document Revised: 07/25/2016 Document Reviewed: 03/24/2015 Elsevier Interactive Patient Education  2017 [place]. Farrel Guimond M.D.

## 2017-09-28 ENCOUNTER — Ambulatory Visit: Payer: 59 | Admitting: Family Medicine

## 2017-10-04 ENCOUNTER — Ambulatory Visit (INDEPENDENT_AMBULATORY_CARE_PROVIDER_SITE_OTHER): Payer: 59 | Admitting: Family Medicine

## 2017-10-04 ENCOUNTER — Encounter: Payer: Self-pay | Admitting: Family Medicine

## 2017-10-04 DIAGNOSIS — Q048 Other specified congenital malformations of brain: Secondary | ICD-10-CM | POA: Diagnosis not present

## 2017-10-04 DIAGNOSIS — M25562 Pain in left knee: Secondary | ICD-10-CM | POA: Diagnosis not present

## 2017-10-04 MED ORDER — VITAMIN D (ERGOCALCIFEROL) 1.25 MG (50000 UNIT) PO CAPS: 50000.0000 [IU] | ORAL_CAPSULE | ORAL | 0 refills | Status: DC

## 2017-10-04 NOTE — Assessment & Plan Note (Signed)
Patient's MRI does show the potential for ectopia. Patient seems to be having headache as well as pain of the neck. C5-C6 nerve root impingement could give some of the bilateral shoulder pain. We discussed posture and patient given home exercises again today. We discussed icing regimen and home exercises. We discussed avoiding certain activities. Trial of topical anti-inflammatories given. Follow-up again in 4 weeks

## 2017-10-04 NOTE — Assessment & Plan Note (Signed)
Patient given injection today and tolerated the procedure well. We discussed icing regimen and home exercises. Discussed topical anti-inflammatories. Has had a synovitis of the knee previously. May need further workup for autoimmune diseases discontinues. Follow-up again in 4 weeks

## 2017-10-04 NOTE — Patient Instructions (Addendum)
Good to see you  Exercises 3 times a week.  pennsaid pinkie amount topically 2 times daily as needed.   Once weekly vitamin D for 12 weeks.  Keep hands within peripheral vision.  Injected knee today and hope it helps See me again in 4-6 weeks

## 2017-10-04 NOTE — Progress Notes (Signed)
[facility] [address], [place] [address] Phone: [phone] Subjective:    I'm seeing this patient by the request  of:    CC: Bilateral shoulder pain, left knee pain  [id number]  Teresa Hickman is a 46 y.o. female coming in bilateral shoulder pain for one year. She use to be able to control her pain with NSAIDs. No mechanism of injury. Pain is intermittent but is getting worse. Her pain radiates anterior to posterior, is stabbing in character and is occasionally into the pec muscle. Patient has not taken her BP medication yet today. Patient did have MRI previously. MRI showed a potential Chiari malformation. Patient was also found to have a shallow disc protrusion at C5-C6 with minimal stenosis. Seems to start from the neck and radiates down. Patient states that it can be very difficult. Can affect daily activities.  Also complaining of left knee pain. States that there is some instability again. Seems to be on the anterior aspect of the knee. Seems to go laterally.     Past Medical History:  Diagnosis Date  . Arthritis   . Dysmenorrhea   . Essential hypertension 12/27/2014  . Fibroid 12/23/2016   1 cm   . GERD (gastroesophageal reflux disease)   . Hyperlipidemia   . Migraine    with some aura   Past Surgical History:  Procedure Laterality Date  . DILATATION & CURETTAGE/HYSTEROSCOPY WITH MYOSURE N/A 04/04/2017   Procedure: DILATATION & CURETTAGE/HYSTEROSCOPY WITH MYOSURE,POLYPECTOMY;  Surgeon: Nunzio Cobbs, MD;  Location: [facility];  Service: Gynecology;  Laterality: N/A;  endometrial polyp  . MOUTH SURGERY    . NO PAST SURGERIES     Social History   Social History  . Marital status: Married    Spouse name: Lawal  . Number of children: 4  . Years of education: 16   Occupational History  . Unemployed    Social History Main Topics  . Smoking status: Never Smoker  . Smokeless tobacco: Never Used  . Alcohol use No  .  Drug use: No  . Sexual activity: Yes    Partners: Male    Birth control/ protection: None   Other Topics Concern  . Not on file   Social History Narrative   Lives with husband and son, Teresa Hickman   From [place]   Medical Family   HH of 6    No pets   G4 p4    Caffeine use: 1 cup coffee per day   No Known Allergies Family History  Problem Relation Age of Onset  . Hypertension Mother   . Diabetes Mother   . Stroke Mother        42s [place]  2010  . Cancer Father      Past medical history, social, surgical and family history all reviewed in electronic medical record.  No pertanent information unless stated regarding to the chief complaint.   Review of Systems:Review of systems updated and as accurate as of 10/04/17  No headache, visual changes, nausea, vomiting, diarrhea, constipation, dizziness, abdominal pain, skin rash, fevers, chills, night sweats, weight loss, swollen lymph nodes, body aches, joint swelling, muscle aches, chest pain, shortness of breath, mood changes.   Objective  Blood pressure 120/80, last menstrual period 09/07/2017. Systems examined below as of 10/04/17   General: No apparent distress alert and oriented x3 mood and affect normal, dressed appropriately.  HEENT: Pupils equal, extraocular movements intact  Respiratory: Patient's speak in  full sentences and does not appear short of breath  Cardiovascular: No lower extremity edema, non tender, no erythema  Skin: Warm dry intact with no signs of infection or rash on extremities or on axial skeleton.  Abdomen: Soft nontender  Neuro: Cranial nerves II through XII are intact, neurovascularly intact in all extremities with 2+ DTRs and 2+ pulses.  Lymph: No lymphadenopathy of posterior or anterior cervical chain or axillae bilaterally.  Gait normal with good balance and coordination.  MSK:  Non tender with full range of motion and good stability and symmetric strength and tone of shoulders, elbows, wrist, hip, and  ankles bilaterally.  Neck: Inspection mild loss in lordosis. No palpable stepoffs. Negative Spurling's maneuver. Near full range of motion motion Grip strength and sensation normal in bilateral hands Strength good C4 to T1 distribution No sensory change to C4 to T1 Negative Hoffman sign bilaterally Reflexes normal Mild tightness of the trapezius bilaterally. Shoulder exam was unremarkable for any type of impingement. Full range of motion.  Knee: Left Normal to inspection with no erythema or effusion or obvious bony abnormalities. Palpation shows some lateral tilt noted. ROM full in flexion and extension and lower leg rotation. Ligaments with solid consistent endpoints including ACL, PCL, LCL, MCL. Negative Mcmurray's, Apley's, and Thessalonian tests. painful patellar compression. Patellar glide with mild to moderate crepitus. Patellar and quadriceps tendons unremarkable. Hamstring and quadriceps strength is normal.   After informed written and verbal consent, patient was seated on exam table. Left knee was prepped with alcohol swab and utilizing anterolateral approach, patient's left knee space was injected with 4:1  marcaine 0.5%: Kenalog 40mg /dL. Patient tolerated the procedure well without immediate complications.  [id number]; 15 additional minutes spent for Therapeutic exercises as stated in above notes.  This included exercises focusing on stretching, strengthening, with significant focus on eccentric aspects.   Long term goals include an improvement in range of motion, strength, endurance as well as avoiding reinjury. Patient's frequency would include in 1-2 times a day, 3-5 times a week for a duration of 6-12 weeks.No swelling, ecchymoses.  No gross deformity. Shoulder Exercises that included:  Basic scapular stabilization to include adduction and depression of scapula Scaption, focusing on proper movement and good control Internal and External rotation utilizing a theraband, with  elbow tucked at side entire time Rows with theraband which was given     Proper technique shown and discussed handout in great detail with ATC.  All questions were discussed and answered.     Impression and Recommendations:     This case required medical decision making of moderate complexity.      Note: This dictation was prepared with Dragon dictation along with smaller phrase technology. Any transcriptional errors that result from this process are unintentional.

## 2017-10-31 NOTE — Progress Notes (Signed)
[facility] [name], [name] [address] Phone: [phone] Subjective:    I'm seeing this patient by the request  of:    CC: Bilateral shoulder pain follow-up  [id number]  Teresa Hickman is a 46 y.o. female coming in with complaint of bilateral shoulder pain.  Pain and what appeared to be a very small Chiari malformation.  This was seen on MRI previously.  MRI was independently visualized by me showing a C5-C6 nerve root impingement.  Patient wants to do topical anti-inflammatories, home exercises and icing regimen.  Patient states much improved at this time.  Not having any significant pain.  Feels that the conservative therapy has helped 85%.  Patient was also found to have knee pain.  Diagnosed with patellofemoral arthritis.  Patient given a knee injection in the left knee.  Patient states still having pain at this time.  Affecting daily activities.  Almost impossible to go up and down stairs leading with the left knee.      Past Medical History:  Diagnosis Date  . Arthritis   . Dysmenorrhea   . Essential hypertension 12/27/2014  . Fibroid 12/23/2016   1 cm   . GERD (gastroesophageal reflux disease)   . Hyperlipidemia   . Migraine    with some aura   Past Surgical History:  Procedure Laterality Date  . DILATATION & CURETTAGE/HYSTEROSCOPY WITH MYOSURE,POLYPECTOMY N/A 04/04/2017   Performed by Nunzio Cobbs, MD at [facility]  . MOUTH SURGERY    . NO PAST SURGERIES     Social History   Socioeconomic History  . Marital status: Married    Spouse name: Lawal  . Number of children: 4  . Years of education: 44  . Highest education level: Not on file  Social Needs  . Financial resource strain: Not on file  . Food insecurity - worry: Not on file  . Food insecurity - inability: Not on file  . Transportation needs - medical: Not on file  . Transportation needs - non-medical: Not on file  Occupational History  .  Occupation: Unemployed  Tobacco Use  . Smoking status: Never Smoker  . Smokeless tobacco: Never Used  Substance and Sexual Activity  . Alcohol use: No  . Drug use: No  . Sexual activity: Yes    Partners: Male    Birth control/protection: None  Other Topics Concern  . Not on file  Social History Narrative   Lives with husband and son, [name]   From [place]   Medical Family   HH of 6    No pets   G4 p4    Caffeine use: 1 cup coffee per day   No Known Allergies Family History  Problem Relation Age of Onset  . Hypertension Mother   . Diabetes Mother   . Stroke Mother        4s [place]  2010  . Cancer Father      Past medical history, social, surgical and family history all reviewed in electronic medical record.  No pertanent information unless stated regarding to the chief complaint.   Review of Systems:Review of systems updated and as accurate as of 10/31/17  No headache, visual changes, nausea, vomiting, diarrhea, constipation, dizziness, abdominal pain, skin rash, fevers, chills, night sweats, weight loss, swollen lymph nodes, body aches, joint swelling, muscle aches, chest pain, shortness of breath, mood changes.   Objective  There were no vitals taken for this visit. Systems examined  below as of 10/31/17   General: No apparent distress alert and oriented x3 mood and affect normal, dressed appropriately.  HEENT: Pupils equal, extraocular movements intact  Respiratory: Patient's speak in full sentences and does not appear short of breath  Cardiovascular: No lower extremity edema, non tender, no erythema  Skin: Warm dry intact with no signs of infection or rash on extremities or on axial skeleton.  Abdomen: Soft nontender  Neuro: Cranial nerves II through XII are intact, neurovascularly intact in all extremities with 2+ DTRs and 2+ pulses.  Lymph: No lymphadenopathy of posterior or anterior cervical chain or axillae bilaterally.  Gait mild antalgic gait MSK:  Non tender with  full range of motion and good stability and symmetric strength and tone of shoulders, elbows, wrist, hip, and ankles bilaterally.  Knee: Left valgus deformity noted.  Abnormal thigh to calf ratio.  Tender to palpation over medial and PF joint line.  ROM full in flexion and extension and lower leg rotation. instability with valgus force.  painful patellar compression. Patellar glide with moderate crepitus. Patellar and quadriceps tendons unremarkable. Hamstring and quadriceps strength is normal. Contralateral knee shows no arthritic changes  After informed written and verbal consent, patient was seated on exam table. Left knee was prepped with alcohol swab and utilizing anterolateral approach, patient's left knee space was injected with15 mg/2.5 mL of Orthovisc(sodium hyaluronate) in a prefilled syringe was injected easily into the knee through a 22-gauge needle..Patient tolerated the procedure well without immediate complications.   Impression and Recommendations:     This case required medical decision making of moderate complexity.      Note: This dictation was prepared with Dragon dictation along with smaller phrase technology. Any transcriptional errors that result from this process are unintentional.

## 2017-11-01 ENCOUNTER — Ambulatory Visit (INDEPENDENT_AMBULATORY_CARE_PROVIDER_SITE_OTHER): Payer: 59 | Admitting: Family Medicine

## 2017-11-01 ENCOUNTER — Encounter: Payer: Self-pay | Admitting: Family Medicine

## 2017-11-01 DIAGNOSIS — M1712 Unilateral primary osteoarthritis, left knee: Secondary | ICD-10-CM

## 2017-11-01 NOTE — Patient Instructions (Signed)
Good to see you We started the orthovisc today  Keep doing everything else See me again in 1 week

## 2017-11-01 NOTE — Assessment & Plan Note (Signed)
Worsening symptoms.  Patient has failed all conservative therapy.  We discussed icing regimen and home exercise.  We discussed which activities of doing which wants to avoid.  Patient will be following up with me for second in a series of 4 injections next week and continue in that same frequency.  No change in other conservative management at this time.

## 2017-11-07 NOTE — Progress Notes (Signed)
[name] [address], [place] [address] Phone: [phone] Subjective:     CC: Knee pain follow-up  TDV:[id number]  Teresa Hickman is a 46 y.o. female coming in with complaint of left knee pain.  Known moderate osteoarthritic changes.  Failed all conservative therapy including injections as well as formal physical therapy.  Patient is here for second in a series of 4 injections for Visco supplementation.  Patient states that she has had a decrease in her pain since the first injection. She notes a tightness in her knee.      Past Medical History:  Diagnosis Date  . Arthritis   . Dysmenorrhea   . Essential hypertension 12/27/2014  . Fibroid 12/23/2016   1 cm   . GERD (gastroesophageal reflux disease)   . Hyperlipidemia   . Migraine    with some aura   Past Surgical History:  Procedure Laterality Date  . DILATATION & CURETTAGE/HYSTEROSCOPY WITH MYOSURE N/A 04/04/2017   Procedure: DILATATION & CURETTAGE/HYSTEROSCOPY WITH MYOSURE,POLYPECTOMY;  Surgeon: Nunzio Cobbs, MD;  Location: [facility];  Service: Gynecology;  Laterality: N/A;  endometrial polyp  . MOUTH SURGERY    . NO PAST SURGERIES     Social History   Socioeconomic History  . Marital status: Married    Spouse name: Lawal  . Number of children: 4  . Years of education: 17  . Highest education level: Not on file  Social Needs  . Financial resource strain: Not on file  . Food insecurity - worry: Not on file  . Food insecurity - inability: Not on file  . Transportation needs - medical: Not on file  . Transportation needs - non-medical: Not on file  Occupational History  . Occupation: Unemployed  Tobacco Use  . Smoking status: Never Smoker  . Smokeless tobacco: Never Used  Substance and Sexual Activity  . Alcohol use: No  . Drug use: No  . Sexual activity: Yes    Partners: Male    Birth control/protection: None  Other Topics Concern  . Not on file  Social  History Narrative   Lives with husband and son, [name]   From [place]   Medical Family   HH of 6    No pets   G4 p4    Caffeine use: 1 cup coffee per day   No Known Allergies Family History  Problem Relation Age of Onset  . Hypertension Mother   . Diabetes Mother   . Stroke Mother        21s [place]  2010  . Cancer Father      Past medical history, social, surgical and family history all reviewed in electronic medical record.  No pertanent information unless stated regarding to the chief complaint.   Review of Systems:Review of systems updated and as accurate as of 11/07/17  No headache, visual changes, nausea, vomiting, diarrhea, constipation, dizziness, abdominal pain, skin rash, fevers, chills, night sweats, weight loss, swollen lymph nodes, body aches, joint swelling, muscle aches, chest pain, shortness of breath, mood changes.   Objective  There were no vitals taken for this visit. Systems examined below as of 11/07/17   General: No apparent distress alert and oriented x3 mood and affect normal, dressed appropriately.  HEENT: Pupils equal, extraocular movements intact  Respiratory: Patient's speak in full sentences and does not appear short of breath  Cardiovascular: No lower extremity edema, non tender, no erythema  Skin: Warm dry intact with no  signs of infection or rash on extremities or on axial skeleton.  Abdomen: Soft nontender  Neuro: Cranial nerves II through XII are intact, neurovascularly intact in all extremities with 2+ DTRs and 2+ pulses.  Lymph: No lymphadenopathy of posterior or anterior cervical chain or axillae bilaterally.  Gait normal with good balance and coordination.  MSK:  Non tender with full range of motion and good stability and symmetric strength and tone of shoulders, elbows, wrist, hip, and ankles bilaterally.  Knee: Left valgus deformity noted. Abnormal thigh to calf ratio.  Tender to palpation over medial and PF joint line.  ROM full in flexion  and extension and lower leg rotation. instability with valgus force.  painful patellar compression. Patellar glide with moderate crepitus. Patellar and quadriceps tendons unremarkable. Hamstring and quadriceps strength is normal. Contralateral knee shows minimal arthritic changes  After informed written and verbal consent, patient was seated on exam table. Left knee was prepped with alcohol swab and utilizing anterolateral approach, patient's left knee space was injected with15 mg/2.5 mL of Orthovisc(sodium hyaluronate) in a prefilled syringe was injected easily into the knee through a 22-gauge needle..Patient tolerated the procedure well without immediate complications.    Impression and Recommendations:     This case required medical decision making of moderate complexity.      Note: This dictation was prepared with Dragon dictation along with smaller phrase technology. Any transcriptional errors that result from this process are unintentional.

## 2017-11-08 ENCOUNTER — Ambulatory Visit: Payer: 59 | Admitting: Family Medicine

## 2017-11-08 ENCOUNTER — Encounter: Payer: Self-pay | Admitting: Family Medicine

## 2017-11-08 DIAGNOSIS — M1712 Unilateral primary osteoarthritis, left knee: Secondary | ICD-10-CM | POA: Diagnosis not present

## 2017-11-08 NOTE — Patient Instructions (Signed)
Good to see yo u Lets keep it going.  Stay active See you again in 1 week for #3

## 2017-11-08 NOTE — Assessment & Plan Note (Signed)
Second in a series of 4 injections given today.  Tolerated the procedure well.  Patient will week for third in a series of 4 injections.

## 2017-11-11 ENCOUNTER — Emergency Department (HOSPITAL_COMMUNITY)
Admission: EM | Admit: 2017-11-11 | Discharge: 2017-11-12 | Disposition: A | Discharge status: Home / Self Care | Payer: 59 | Attending: Emergency Medicine | Admitting: Emergency Medicine

## 2017-11-11 DIAGNOSIS — Y929 Unspecified place or not applicable: Secondary | ICD-10-CM | POA: Insufficient documentation

## 2017-11-11 DIAGNOSIS — Y939 Activity, unspecified: Secondary | ICD-10-CM | POA: Insufficient documentation

## 2017-11-11 DIAGNOSIS — X58XXXA Exposure to other specified factors, initial encounter: Secondary | ICD-10-CM | POA: Diagnosis not present

## 2017-11-11 DIAGNOSIS — T7840XA Allergy, unspecified, initial encounter: Secondary | ICD-10-CM

## 2017-11-11 DIAGNOSIS — I10 Essential (primary) hypertension: Secondary | ICD-10-CM | POA: Diagnosis not present

## 2017-11-11 DIAGNOSIS — Z79899 Other long term (current) drug therapy: Secondary | ICD-10-CM | POA: Diagnosis not present

## 2017-11-11 DIAGNOSIS — R000 Tachycardia, unspecified: Secondary | ICD-10-CM | POA: Diagnosis not present

## 2017-11-11 DIAGNOSIS — T450X1A Poisoning by antiallergic and antiemetic drugs, accidental (unintentional), initial encounter: Secondary | ICD-10-CM | POA: Diagnosis not present

## 2017-11-11 DIAGNOSIS — Y999 Unspecified external cause status: Secondary | ICD-10-CM | POA: Insufficient documentation

## 2017-11-11 LAB — I-STAT CHEM 8, ED
BUN: 15 mg/dL (ref 6–20)
CALCIUM ION: 1.16 mmol/L (ref 1.15–1.40)
CREATININE: 0.6 mg/dL (ref 0.44–1.00)
Chloride: 103 mmol/L (ref 101–111)
GLUCOSE: 119 mg/dL — AB (ref 65–99)
HCT: 37 % (ref 36.0–46.0)
HEMOGLOBIN: 12.6 g/dL (ref 12.0–15.0)
Potassium: 4.3 mmol/L (ref 3.5–5.1)
Sodium: 140 mmol/L (ref 135–145)
TCO2: 29 mmol/L (ref 22–32)

## 2017-11-11 MED ORDER — DIPHENHYDRAMINE HCL 50 MG/ML IJ SOLN
25.0000 mg | Freq: Once | INTRAMUSCULAR | Status: AC
  Administered 2017-11-11: 25 mg via INTRAVENOUS
  Filled 2017-11-11: qty 1

## 2017-11-11 MED ORDER — FAMOTIDINE IN NACL 20-0.9 MG/50ML-% IV SOLN
20.0000 mg | Freq: Once | INTRAVENOUS | Status: AC
  Administered 2017-11-11: 20 mg via INTRAVENOUS
  Filled 2017-11-11: qty 50

## 2017-11-11 MED ORDER — DIPHENHYDRAMINE HCL 50 MG/ML IJ SOLN
25.0000 mg | Freq: Once | INTRAMUSCULAR | Status: AC
  Administered 2017-11-11: 25 mg via INTRAVENOUS

## 2017-11-11 MED ORDER — METHYLPREDNISOLONE SODIUM SUCC 125 MG IJ SOLR
125.0000 mg | Freq: Once | INTRAMUSCULAR | Status: AC
  Administered 2017-11-11: 125 mg via INTRAVENOUS
  Filled 2017-11-11: qty 2

## 2017-11-11 MED ORDER — SODIUM CHLORIDE 0.9 % IV BOLUS (SEPSIS)
1000.0000 mL | Freq: Once | INTRAVENOUS | Status: AC
  Administered 2017-11-11: 1000 mL via INTRAVENOUS

## 2017-11-11 MED ORDER — LORAZEPAM 2 MG/ML IJ SOLN: 0.5000 mg | Freq: Once | INTRAMUSCULAR | Status: DC

## 2017-11-11 NOTE — ED Triage Notes (Signed)
Pt came in by POV. Pt c/o burning/swelling by her eyes and her mouth after using an OTC topical pain reliever called pain bloc. Pt is CAOx4, denies SOB, lungs clear. Pt reports burning on the face is improving.

## 2017-11-11 NOTE — ED Notes (Signed)
Pt had a heaviness in her chest and ST upon administration of Benadryl. She became very anxious. Pt was able to be coached and the feeling passed. HR and reduced to 120's and continues to decrease.

## 2017-11-12 NOTE — ED Provider Notes (Signed)
[facility] DEPT Provider Note   CSN: [id number] Arrival date & time: 11/11/17  2234     History   Chief Complaint Chief Complaint  Patient presents with  . Allergic Reaction    HPI Teresa Hickman is a 46 y.o. female.  Patient use some topical arthritis medicine to her shoulder and knee and then started having swelling to her face itching.   The history is provided by the patient and a relative. No language interpreter was used.  Allergic Reaction  Presenting symptoms: itching and rash   Presenting symptoms: no difficulty breathing   Severity:  Moderate Prior allergic episodes:  No prior episodes Context: not animal exposure   Relieved by:  Nothing Worsened by:  Nothing   Past Medical History:  Diagnosis Date  . Arthritis   . Dysmenorrhea   . Essential hypertension 12/27/2014  . Fibroid 12/23/2016   1 cm   . GERD (gastroesophageal reflux disease)   . Hyperlipidemia   . Migraine    with some aura    Patient Active Problem List   Diagnosis Date Noted  . Degenerative joint disease of knee, left 11/01/2017  . Neck pain 06/21/2016  . Migraine headache without aura 06/21/2016  . 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 Cervical paraspinal muscle spasm 06/09/2016  . Chest pain 04/12/2016  . Patellofemoral arthralgia of left knee 05/29/2015  . Synovitis of knee 05/09/2015  . Fracture of first metatarsal bone 01/09/2015  . Right foot pain 12/27/2014  . Essential hypertension 12/27/2014  . Patellofemoral pain syndrome 08/12/2014  . Patellar pain 08/01/2014  . Pain of left lower extremity 08/01/2014  . Hx of migraine with aura 06/18/2013  . Elevated blood pressure reading 06/18/2013  . Bilateral anterior knee pain 09/23/2012  . Low back pain 09/23/2012  . Contraception management 03/18/2011  . Irregular menses 03/18/2011  . ELEVATED BLOOD PRESSURE WITHOUT DIAGNOSIS OF HYPERTENSION 03/02/2010  . AMENORRHEA 10/16/2008  .  Hyperlipidemia 05/01/2008  . MIGRAINE HEADACHE 05/01/2008  . HEADACHE 05/01/2008  . LUMBAR STRAIN 05/01/2008    Past Surgical History:  Procedure Laterality Date  . DILATATION & CURETTAGE/HYSTEROSCOPY WITH MYOSURE N/A 04/04/2017   Procedure: DILATATION & CURETTAGE/HYSTEROSCOPY WITH MYOSURE,POLYPECTOMY;  Surgeon: Nunzio Cobbs, MD;  Location: [facility];  Service: Gynecology;  Laterality: N/A;  endometrial polyp  . MOUTH SURGERY    . NO PAST SURGERIES      OB History    Gravida Para Term Preterm AB Living   4 4       4    SAB TAB Ectopic Multiple Live Births           4       Home Medications    Prior to Admission medications   Medication Sig Start Date End Date Taking? Authorizing Provider  acetaminophen (TYLENOL) 500 MG tablet Take 1,000 mg by mouth every 6 (six) hours as needed for moderate pain or headache.   Yes [provider]  calcium carbonate (TUMS - DOSED IN MG ELEMENTAL CALCIUM) 500 MG chewable tablet Chew 1 tablet by mouth daily as needed for indigestion or heartburn.   Yes [provider]  hydrochlorothiazide (HYDRODIURIL) 25 MG tablet Take 1 tablet (25 mg total) by mouth daily. Patient taking differently: Take 12.5 mg by mouth daily.  10/19/16  Yes Panosh, Standley Brooking, MD  omeprazole (PRILOSEC) 20 MG capsule Take 1 capsule (20 mg total) by mouth daily. Patient taking differently: Take 20 mg by mouth  daily as needed (acid reflux).  04/14/15  Yes Jola Schmidt, MD  Vitamin D, Ergocalciferol, (DRISDOL) 50000 units CAPS capsule Take 1 capsule (50,000 Units total) by mouth every 7 (seven) days. 10/04/17  Yes Lyndal Pulley, DO  ciprofloxacin (CIPRO) 500 MG tablet Take 1 tablet (500 mg total) by mouth 2 (two) times daily. Patient not taking: Reported on 11/11/2017 04/14/17   Nunzio Cobbs, MD  diclofenac (VOLTAREN) 75 MG EC tablet Take 1 tablet (75 mg total) by mouth 2 (two) times daily. For 10 days and then as needed Patient not taking:  Reported on 11/11/2017 09/12/17   Panosh, Standley Brooking, MD  ibuprofen (ADVIL,MOTRIN) 800 MG tablet Take 1 tablet (800 mg total) by mouth every 8 (eight) hours as needed. Patient not taking: Reported on 11/11/2017 04/04/17   Nunzio Cobbs, MD    Family History Family History  Problem Relation Age of Onset  . Hypertension Mother   . Diabetes Mother   . Stroke Mother        74s [place]  2010  . Cancer Father     Social History Social History   Tobacco Use  . Smoking status: Never Smoker  . Smokeless tobacco: Never Used  Substance Use Topics  . Alcohol use: No  . Drug use: No     Allergies   Patient has no known allergies.   Review of Systems Review of Systems  Constitutional: Negative for appetite change and fatigue.  HENT: Negative for congestion, ear discharge and sinus pressure.   Eyes: Negative for discharge.  Respiratory: Negative for cough.   Cardiovascular: Negative for chest pain.  Gastrointestinal: Negative for abdominal pain and diarrhea.  Genitourinary: Negative for frequency and hematuria.  Musculoskeletal: Negative for back pain.  Skin: Positive for itching and rash.       Itching  Neurological: Negative for seizures and headaches.  Psychiatric/Behavioral: Negative for hallucinations.     Physical Exam Updated Vital Signs BP 140/81   Pulse 98   Temp 98.1 F (36.7 C) (Oral)   Resp (!) 21   Ht 5\' 6"  (1.676 m)   Wt 83.5 kg (184 lb)   SpO2 99%   BMI 29.70 kg/m   Physical Exam  Constitutional: She is oriented to person, place, and time. She appears well-developed.  HENT:  Head: Normocephalic.  Patient has mild swelling to the left orbit and cheek with itching and redness.  Eyes: Conjunctivae are normal.  Neck: No tracheal deviation present.  Cardiovascular:  No murmur heard. Musculoskeletal: Normal range of motion.  Neurological: She is oriented to person, place, and time.  Skin: Skin is warm.  Psychiatric: She has a normal mood and  affect.     ED Treatments / Results  Labs (all labs ordered are listed, but only abnormal results are displayed) Labs Reviewed  I-STAT CHEM 8, ED - Abnormal; Notable for the following components:      Result Value   Glucose, Bld 119 (*)    All other components within normal limits    EKG  EKG Interpretation None       Radiology No results found.  Procedures Procedures (including critical care time)  Medications Ordered in ED Medications  diphenhydrAMINE (BENADRYL) injection 25 mg (25 mg Intravenous Given 11/11/17 2322)  methylPREDNISolone sodium succinate (SOLU-MEDROL) 125 mg/2 mL injection 125 mg (125 mg Intravenous Given 11/11/17 2321)  sodium chloride 0.9 % bolus 1,000 mL (1,000 mLs Intravenous New Bag/Given 11/11/17 2323)  famotidine (  PEPCID) IVPB 20 mg premix (20 mg Intravenous New Bag/Given 11/11/17 2322)  diphenhydrAMINE (BENADRYL) injection 25 mg (25 mg Intravenous Given 11/11/17 2322)     Initial Impression / Assessment and Plan / ED Course  I have reviewed the triage vital signs and the nursing notes.  Pertinent labs & imaging results that were available during my care of the patient were reviewed by me and considered in my medical decision making (see chart for details).     Patient was given Benadryl Pepcid and Solu-Medrol.  She had an anxiety reaction probably secondary to the Benadryl from complaining of dry mouth.  Patient relaxed after about 15 minutes.  Diagnosis mild allergic reaction.  Final Clinical Impressions(s) / ED Diagnoses   Final diagnoses:  Allergic reaction, initial encounter    ED Discharge Orders    None       Milton Ferguson, MD 11/12/17 [phone]

## 2017-11-12 NOTE — Discharge Instructions (Signed)
Follow up if any problems °

## 2017-11-15 ENCOUNTER — Ambulatory Visit: Payer: 59 | Admitting: Family Medicine

## 2017-11-15 ENCOUNTER — Encounter: Payer: Self-pay | Admitting: Family Medicine

## 2017-11-15 DIAGNOSIS — M1712 Unilateral primary osteoarthritis, left knee: Secondary | ICD-10-CM | POA: Diagnosis not present

## 2017-11-15 NOTE — Progress Notes (Signed)
[name] [place],  [address] Phone: [phone] Subjective:    I'm seeing this patient by the request  of:    CC: Left knee pain follow-up  [id number]  Teresa Hickman is a 46 y.o. female coming in with complaint of left knee pain.  Found to have degenerative arthritis.  Failed all conservative therapy.  Here for third in a series of 4 injections for Visco supplementation.  Patient states that she has been doing better since last injection.      Past Medical History:  Diagnosis Date  . Arthritis   . Dysmenorrhea   . Essential hypertension 12/27/2014  . Fibroid 12/23/2016   1 cm   . GERD (gastroesophageal reflux disease)   . Hyperlipidemia   . Migraine    with some aura   Past Surgical History:  Procedure Laterality Date  . DILATATION & CURETTAGE/HYSTEROSCOPY WITH MYOSURE N/A 04/04/2017   Procedure: DILATATION & CURETTAGE/HYSTEROSCOPY WITH MYOSURE,POLYPECTOMY;  Surgeon: Nunzio Cobbs, MD;  Location: [facility];  Service: Gynecology;  Laterality: N/A;  endometrial polyp  . MOUTH SURGERY    . NO PAST SURGERIES     Social History   Socioeconomic History  . Marital status: Married    Spouse name: Lawal  . Number of children: 4  . Years of education: 59  . Highest education level: Not on file  Social Needs  . Financial resource strain: Not on file  . Food insecurity - worry: Not on file  . Food insecurity - inability: Not on file  . Transportation needs - medical: Not on file  . Transportation needs - non-medical: Not on file  Occupational History  . Occupation: Unemployed  Tobacco Use  . Smoking status: Never Smoker  . Smokeless tobacco: Never Used  Substance and Sexual Activity  . Alcohol use: No  . Drug use: No  . Sexual activity: Yes    Partners: Male    Birth control/protection: None  Other Topics Concern  . Not on file  Social History Narrative   Lives with husband and son, [name]   From [place]    Medical Family   HH of 6    No pets   G4 p4    Caffeine use: 1 cup coffee per day   No Known Allergies Family History  Problem Relation Age of Onset  . Hypertension Mother   . Diabetes Mother   . Stroke Mother        24s [place]  2010  . Cancer Father      Past medical history, social, surgical and family history all reviewed in electronic medical record.  No pertanent information unless stated regarding to the chief complaint.   Review of Systems:Review of systems updated and as accurate as of 11/15/17  No headache, visual changes, nausea, vomiting, diarrhea, constipation, dizziness, abdominal pain, skin rash, fevers, chills, night sweats, weight loss, swollen lymph nodes, body aches, joint swelling, muscle aches, chest pain, shortness of breath, mood changes.   Objective  There were no vitals taken for this visit. Systems examined below as of 11/15/17   General: No apparent distress alert and oriented x3 mood and affect normal, dressed appropriately.  HEENT: Pupils equal, extraocular movements intact  Respiratory: Patient's speak in full sentences and does not appear short of breath  Cardiovascular: No lower extremity edema, non tender, no erythema  Skin: Warm dry intact with no signs of infection or rash on extremities or  on axial skeleton.  Abdomen: Soft nontender  Neuro: Cranial nerves II through XII are intact, neurovascularly intact in all extremities with 2+ DTRs and 2+ pulses.  Lymph: No lymphadenopathy of posterior or anterior cervical chain or axillae bilaterally.  Gait normal with good balance and coordination.  MSK:  Non tender with full range of motion and good stability and symmetric strength and tone of shoulders, elbows, wrist, hip and ankles bilaterally.  Knee: Left valgus deformity noted. Large thigh to calf ratio.  Tender to palpation over medial and PF joint line.  ROM full in flexion and extension and lower leg rotation. instability with valgus force.    painful patellar compression. Patellar glide with moderate crepitus. Patellar and quadriceps tendons unremarkable. Hamstring and quadriceps strength is normal. Contralateral knee shows minimal arthritic changes with no instability  After informed written and verbal consent, patient was seated on exam table. Left knee was prepped with alcohol swab and utilizing anterolateral approach, patient's left knee space was injected with15 mg/2.5 mL of Orthovisc(sodium hyaluronate) in a prefilled syringe was injected easily into the knee through a 22-gauge needle..Patient tolerated the procedure well without immediate complications.   Impression and Recommendations:     This case required medical decision making of moderate complexity.      Note: This dictation was prepared with Dragon dictation along with smaller phrase technology. Any transcriptional errors that result from this process are unintentional.

## 2017-11-15 NOTE — Assessment & Plan Note (Signed)
3 out of a series of 4 injections given today, continue conservative therapy.  Follow-up again in 1 week for fourth and final injection

## 2017-11-22 ENCOUNTER — Ambulatory Visit: Payer: 59 | Admitting: Family Medicine

## 2017-11-22 NOTE — Progress Notes (Deleted)
[name] [place], [place] [address] Phone: [phone] Subjective:    I'm seeing this patient by the request  of:    CC:   [id number]  Teresa Hickman is a 46 y.o. female coming in with complaint of ***  Onset-  Location Duration-  Character- Aggravating factors- Reliving factors-  Therapies tried-  Severity-     Past Medical History:  Diagnosis Date  . Arthritis   . Dysmenorrhea   . Essential hypertension 12/27/2014  . Fibroid 12/23/2016   1 cm   . GERD (gastroesophageal reflux disease)   . Hyperlipidemia   . Migraine    with some aura   Past Surgical History:  Procedure Laterality Date  . DILATATION & CURETTAGE/HYSTEROSCOPY WITH MYOSURE N/A 04/04/2017   Procedure: DILATATION & CURETTAGE/HYSTEROSCOPY WITH MYOSURE,POLYPECTOMY;  Surgeon: Nunzio Cobbs, MD;  Location: [facility];  Service: Gynecology;  Laterality: N/A;  endometrial polyp  . MOUTH SURGERY    . NO PAST SURGERIES     Social History   Socioeconomic History  . Marital status: Married    Spouse name: Lawal  . Number of children: 4  . Years of education: 68  . Highest education level: Not on file  Social Needs  . Financial resource strain: Not on file  . Food insecurity - worry: Not on file  . Food insecurity - inability: Not on file  . Transportation needs - medical: Not on file  . Transportation needs - non-medical: Not on file  Occupational History  . Occupation: Unemployed  Tobacco Use  . Smoking status: Never Smoker  . Smokeless tobacco: Never Used  Substance and Sexual Activity  . Alcohol use: No  . Drug use: No  . Sexual activity: Yes    Partners: Male    Birth control/protection: None  Other Topics Concern  . Not on file  Social History Narrative   Lives with husband and son, [name]   From [place]   Medical Family   HH of 6    No pets   G4 p4    Caffeine use: 1 cup coffee per day   No Known Allergies Family History    Problem Relation Age of Onset  . Hypertension Mother   . Diabetes Mother   . Stroke Mother        54s [place]  2010  . Cancer Father      Past medical history, social, surgical and family history all reviewed in electronic medical record.  No pertanent information unless stated regarding to the chief complaint.   Review of Systems:Review of systems updated and as accurate as of 11/22/17  No headache, visual changes, nausea, vomiting, diarrhea, constipation, dizziness, abdominal pain, skin rash, fevers, chills, night sweats, weight loss, swollen lymph nodes, body aches, joint swelling, muscle aches, chest pain, shortness of breath, mood changes.   Objective  There were no vitals taken for this visit. Systems examined below as of 11/22/17   General: No apparent distress alert and oriented x3 mood and affect normal, dressed appropriately.  HEENT: Pupils equal, extraocular movements intact  Respiratory: Patient's speak in full sentences and does not appear short of breath  Cardiovascular: No lower extremity edema, non tender, no erythema  Skin: Warm dry intact with no signs of infection or rash on extremities or on axial skeleton.  Abdomen: Soft nontender  Neuro: Cranial nerves II through XII are intact, neurovascularly intact in all extremities with 2+ DTRs and  2+ pulses.  Lymph: No lymphadenopathy of posterior or anterior cervical chain or axillae bilaterally.  Gait normal with good balance and coordination.  MSK:  Non tender with full range of motion and good stability and symmetric strength and tone of shoulders, elbows, wrist, hip, knee and ankles bilaterally.     Impression and Recommendations:     This case required medical decision making of moderate complexity.      Note: This dictation was prepared with Dragon dictation along with smaller phrase technology. Any transcriptional errors that result from this process are unintentional.

## 2017-11-23 ENCOUNTER — Ambulatory Visit: Payer: 59 | Admitting: Family Medicine

## 2017-12-22 LAB — HM COLONOSCOPY

## 2017-12-26 NOTE — Progress Notes (Signed)
Chief Complaint  Patient presents with  . Abdominal Pain    comes and goes. Pt has decreased appetite. Saw Dr Collene Mares in the last year and had a gallbladder test and ultrasound - normal results. Pt given medication to treat abdominal pain. Pt states that the pain started again several weeks ago.    HPI: Teresa Hickman 47 y.o. come in forabd pain  Burping and upper pain  Over the past month or weeks   Has hx of same  And had seen Dr Collene Mares.  Last year  Had neg [place] and hida scan  And   Was given a sample  And was   Mostly Better until   A few weeks ago .  She describes it as a lot of gas that moves around mostly mid and upper abdomen without major change in stool habits no blood fever weight loss she does state that she can only take small amounts of food at a time. She has tried to get Gaviscon add Gas-X.  Not a lot of help No fever weight loss recent out of country travel family members have similar symptoms. Periods ok .     ROS: See pertinent positives and negatives per HPI.  Past Medical History:  Diagnosis Date  . Arthritis   . Dysmenorrhea   . Essential hypertension 12/27/2014  . Fibroid 12/23/2016   1 cm   . GERD (gastroesophageal reflux disease)   . Hyperlipidemia   . Migraine    with some aura   Past Surgical History:  Procedure Laterality Date  . DILATATION & CURETTAGE/HYSTEROSCOPY WITH MYOSURE N/A 04/04/2017   Procedure: DILATATION & CURETTAGE/HYSTEROSCOPY WITH MYOSURE,POLYPECTOMY;  Surgeon: Nunzio Cobbs, MD;  Location: [facility];  Service: Gynecology;  Laterality: N/A;  endometrial polyp  . MOUTH SURGERY    . NO PAST SURGERIES       Family History  Problem Relation Age of Onset  . Hypertension Mother   . Diabetes Mother   . Stroke Mother        80s [place]  2010  . Cancer Father     Social History   Socioeconomic History  . Marital status: Married    Spouse name: Lawal  . Number of children: 4  . Years of education: 78  . Highest education level:  None  Social Needs  . Financial resource strain: None  . Food insecurity - worry: None  . Food insecurity - inability: None  . Transportation needs - medical: None  . Transportation needs - non-medical: None  Occupational History  . Occupation: Unemployed  Tobacco Use  . Smoking status: Never Smoker  . Smokeless tobacco: Never Used  Substance and Sexual Activity  . Alcohol use: No  . Drug use: No  . Sexual activity: Yes    Partners: Male    Birth control/protection: None  Other Topics Concern  . None  Social History Narrative   Lives with husband and son, [name]   From [place]   Medical Family   HH of 6    No pets   G4 p4    Caffeine use: 1 cup coffee per day    Outpatient Medications Prior to Visit  Medication Sig Dispense Refill  . acetaminophen (TYLENOL) 500 MG tablet Take 1,000 mg by mouth every 6 (six) hours as needed for moderate pain or headache.    . calcium carbonate (TUMS - DOSED IN MG ELEMENTAL CALCIUM) 500 MG chewable tablet Chew 1 tablet by mouth  daily as needed for indigestion or heartburn.    . hydrochlorothiazide (HYDRODIURIL) 25 MG tablet Take 1 tablet (25 mg total) by mouth daily. (Patient taking differently: Take 12.5 mg by mouth daily. ) 90 tablet 0  . ibuprofen (ADVIL,MOTRIN) 800 MG tablet Take 1 tablet (800 mg total) by mouth every 8 (eight) hours as needed. 30 tablet 0  . omeprazole (PRILOSEC) 20 MG capsule Take 1 capsule (20 mg total) by mouth daily. (Patient taking differently: Take 20 mg by mouth daily as needed (acid reflux). ) 30 capsule 0  . ciprofloxacin (CIPRO) 500 MG tablet Take 1 tablet (500 mg total) by mouth 2 (two) times daily. (Patient not taking: Reported on 12/27/2017) 14 tablet 0  . diclofenac (VOLTAREN) 75 MG EC tablet Take 1 tablet (75 mg total) by mouth 2 (two) times daily. For 10 days and then as needed (Patient not taking: Reported on 12/27/2017) 30 tablet 0  . Vitamin D, Ergocalciferol, (DRISDOL) 50000 units CAPS capsule Take 1 capsule  (50,000 Units total) by mouth every 7 (seven) days. (Patient not taking: Reported on 12/27/2017) 12 capsule 0   No facility-administered medications prior to visit.      EXAM:  BP 128/82 (BP Location: Right Arm, Patient Position: Sitting, Cuff Size: Normal)   Pulse 87   Temp 98.4 F (36.9 C) (Oral)   Wt 183 lb 12.8 oz (83.4 kg)   BMI 29.67 kg/m   Body mass index is 29.67 kg/m.  GENERAL: vitals reviewed and listed above, alert, oriented, appears well hydrated and in no acute distress HEENT: atraumatic, conjunctiva  clear, no obvious abnormalities on inspection of external nose and ears  NECK: no obvious masses on inspection palpation  LUNGS: clear to auscultation bilaterally, no wheezes, rales or rhonchi, good air movement CV: HRRR, no clubbing cyanosis or  peripheral edema nl cap refill  Abdomen:  Sof,t normal bowel sounds without hepatosplenomegaly, no guarding rebound or masses no CVA tenderness  bs positive  Points to periumbilical mid abd area  MS: moves all extremities without noticeable focal  abnormality PSYCH: pleasant and cooperative, no obvious depression or anxiety  BP Readings from Last 3 Encounters:  12/27/17 128/82  11/15/17 (!) 142/92  11/12/17 (!) 142/79   Wt Readings from Last 3 Encounters:  12/27/17 183 lb 12.8 oz (83.4 kg)  11/15/17 184 lb (83.5 kg)  11/11/17 184 lb (83.5 kg)    ASSESSMENT AND PLAN:  Discussed the following assessment and plan:  GI symptoms  Dyspepsia  Abdominal bloating .uncertain cause   Last  eval neg gb disease  ? If motility or food sensitivity   Problem  Will do diet trials and add  Ranitidine instead of Prilosec  And  Fu in about 6-8 weeks   Consider   Other eval  -Patient advised to return or notify health care team  if  new concerns arise.  Patient Instructions  Try lactose free  Diet   Then try  wheat or gluten free temporarily    Also a diet called  FODMAPs  Diet.   If not better we may have  You see dr Collene Mares  again.    Add  zantac 150 twice a day    Then  rov in about 6 weeks or as needed      Mariann Laster K. Laurianne Floresca M.D.

## 2017-12-27 ENCOUNTER — Ambulatory Visit (INDEPENDENT_AMBULATORY_CARE_PROVIDER_SITE_OTHER): Payer: 59 | Admitting: Internal Medicine

## 2017-12-27 ENCOUNTER — Encounter: Payer: Self-pay | Admitting: Internal Medicine

## 2017-12-27 VITALS — BP 128/82 | HR 87 | Temp 98.4°F | Wt 183.8 lb

## 2017-12-27 DIAGNOSIS — R1013 Epigastric pain: Secondary | ICD-10-CM | POA: Diagnosis not present

## 2017-12-27 DIAGNOSIS — R140 Abdominal distension (gaseous): Secondary | ICD-10-CM | POA: Diagnosis not present

## 2017-12-27 DIAGNOSIS — R198 Other specified symptoms and signs involving the digestive system and abdomen: Secondary | ICD-10-CM

## 2017-12-27 MED ORDER — RANITIDINE HCL 150 MG PO TABS: 150.0000 mg | ORAL_TABLET | Freq: Two times a day (BID) | ORAL | 1 refills | Status: DC

## 2017-12-27 NOTE — Patient Instructions (Addendum)
Try lactose free  Diet   Then try  wheat or gluten free temporarily    Also a diet called  FODMAPs  Diet.   If not better we may have  You see dr Collene Mares again.    Add  zantac 150 twice a day    Then  rov in about 6 weeks or as needed

## 2018-01-31 DIAGNOSIS — R140 Abdominal distension (gaseous): Secondary | ICD-10-CM | POA: Diagnosis not present

## 2018-01-31 DIAGNOSIS — K5904 Chronic idiopathic constipation: Secondary | ICD-10-CM | POA: Diagnosis not present

## 2018-01-31 DIAGNOSIS — K219 Gastro-esophageal reflux disease without esophagitis: Secondary | ICD-10-CM | POA: Diagnosis not present

## 2018-01-31 LAB — BASIC METABOLIC PANEL
BUN: 8 (ref 4–21)
Creatinine: 0.6 (ref 0.5–1.1)
Glucose: 72
Potassium: 4.2 (ref 3.4–5.3)
SODIUM: 140 (ref 137–147)

## 2018-01-31 LAB — CBC AND DIFFERENTIAL
HEMATOCRIT: 37 (ref 36–46)
HEMOGLOBIN: 11.9 — AB (ref 12.0–16.0)
Platelets: 339 (ref 150–399)
WBC: 5.8

## 2018-01-31 LAB — HEPATIC FUNCTION PANEL
ALT: 12 (ref 7–35)
AST: 16 (ref 13–35)
BILIRUBIN, TOTAL: 0.2

## 2018-01-31 LAB — TSH: TSH: 1.1 (ref 0.41–5.90)

## 2018-02-23 ENCOUNTER — Encounter: Payer: Self-pay | Admitting: Internal Medicine

## 2018-02-28 DIAGNOSIS — R140 Abdominal distension (gaseous): Secondary | ICD-10-CM | POA: Diagnosis not present

## 2018-02-28 DIAGNOSIS — E669 Obesity, unspecified: Secondary | ICD-10-CM | POA: Diagnosis not present

## 2018-02-28 DIAGNOSIS — K219 Gastro-esophageal reflux disease without esophagitis: Secondary | ICD-10-CM | POA: Diagnosis not present

## 2018-02-28 DIAGNOSIS — K5904 Chronic idiopathic constipation: Secondary | ICD-10-CM | POA: Diagnosis not present

## 2018-04-20 IMAGING — CT CT ABD-PELV W/ CM
2 of 6 series · 17 of 46 positions shown, 19 images · IV contrast (ISOVUE)
Comparison: None.

CLINICAL DATA: Acute central abdominal pain.  Vomiting.

EXAM:
CT ABDOMEN AND PELVIS WITH CONTRAST
TECHNIQUE: Multidetector CT imaging of the abdomen and pelvis was performed
using the standard protocol following bolus administration of
intravenous contrast.
CONTRAST:  100mL [id number] IOPAMIDOL ([id number]) INJECTION 61%

[Series 2: abd/pel with · axial · 0.81mm/px · z∈[+1202,+1578]mm · 14 of 87 slices shown, 16 images]
[im 6/87  soft-tissue]
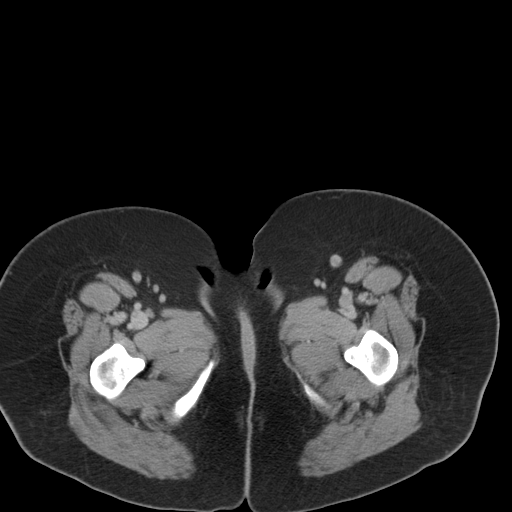
[im 6/87  bone]
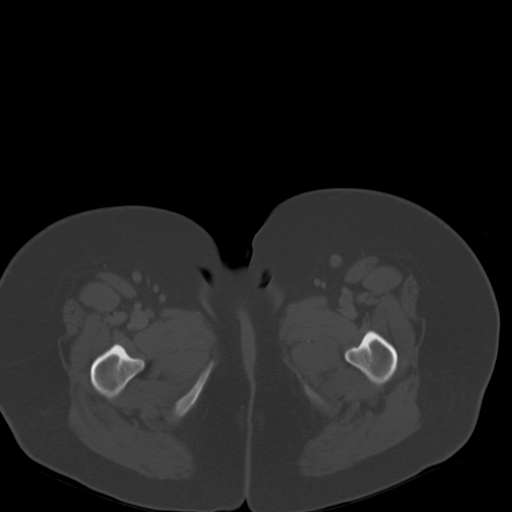
[im 12/87  soft-tissue]
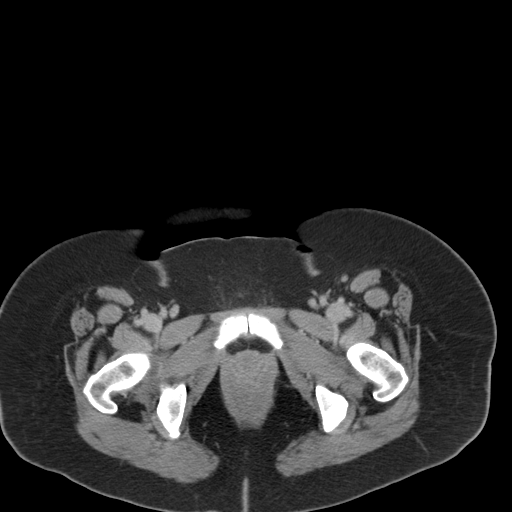
[im 18/87  soft-tissue]
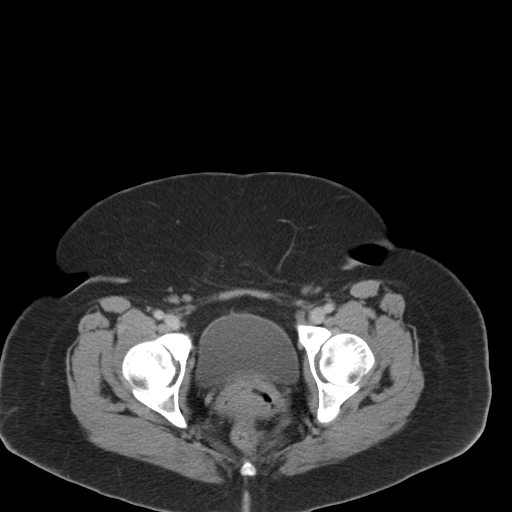
[im 23/87  soft-tissue]
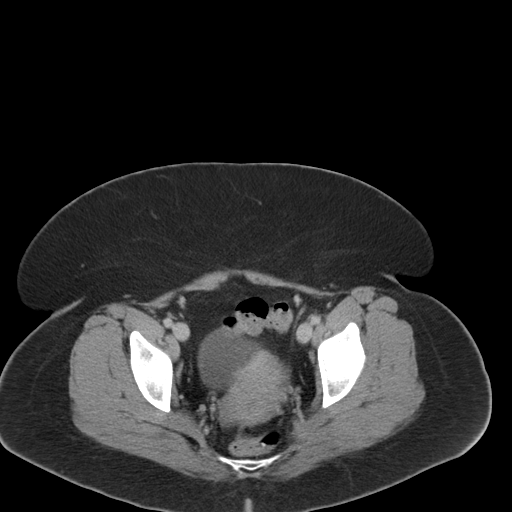
[im 29/87  soft-tissue]
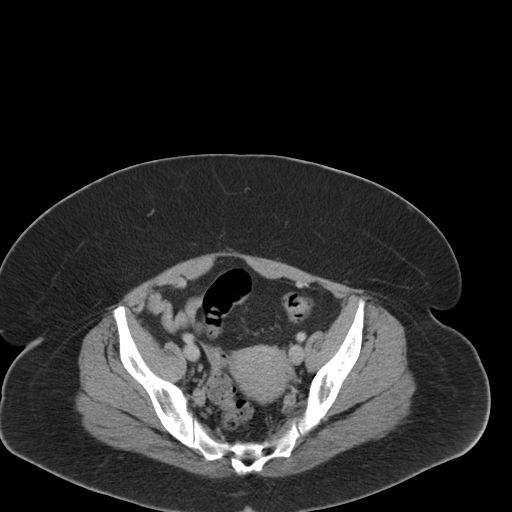
[im 35/87  soft-tissue]
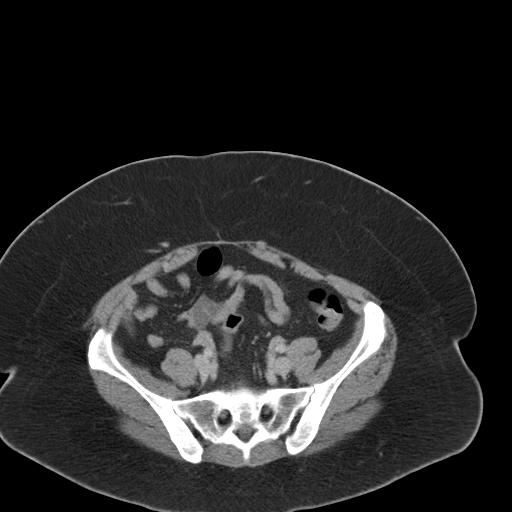
[im 41/87  soft-tissue]
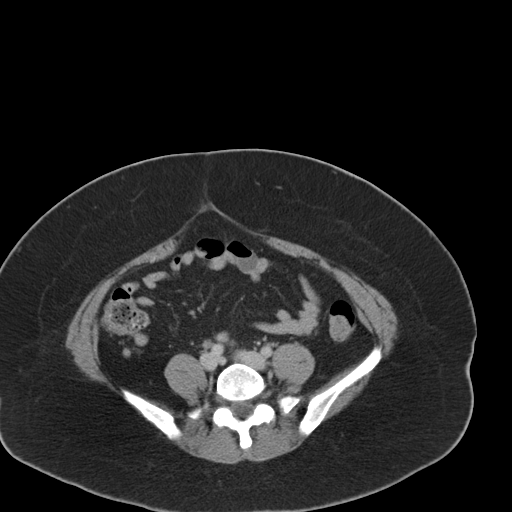
[im 46/87  soft-tissue]
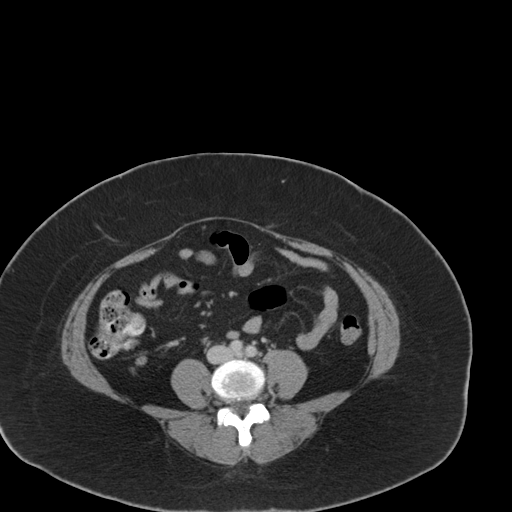
[im 52/87  soft-tissue]
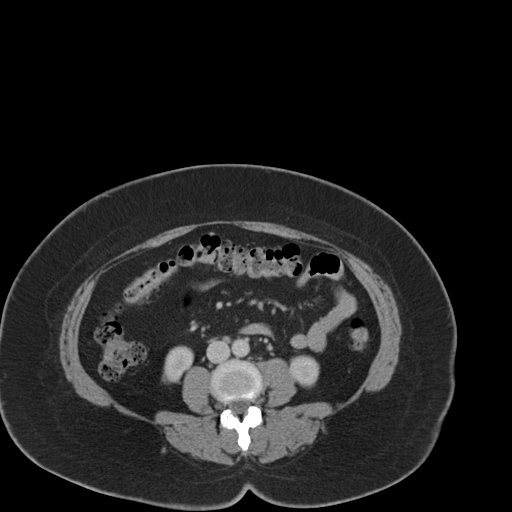
[im 52/87  bone]
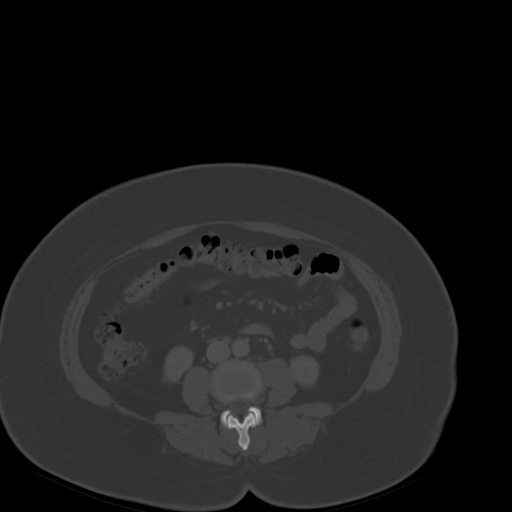
[im 58/87  soft-tissue]
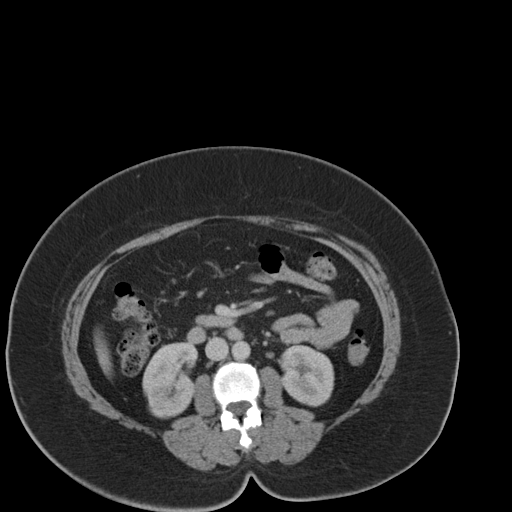
[im 64/87  soft-tissue]
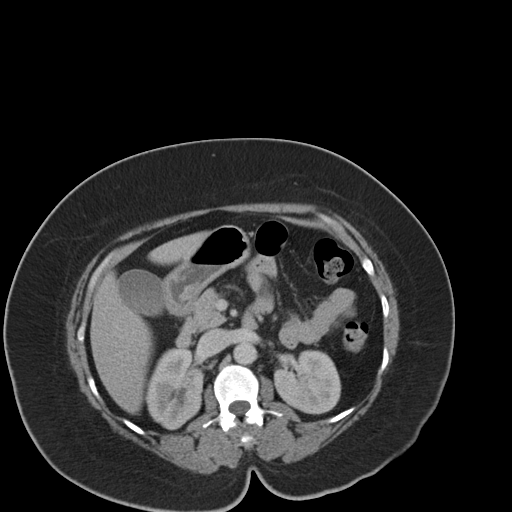
[im 69/87  soft-tissue]
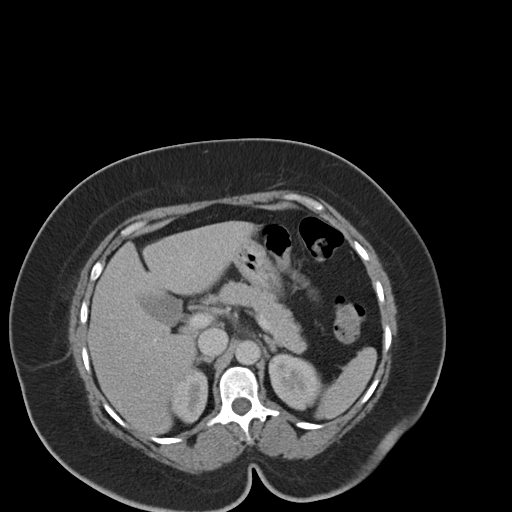
[im 75/87  soft-tissue]
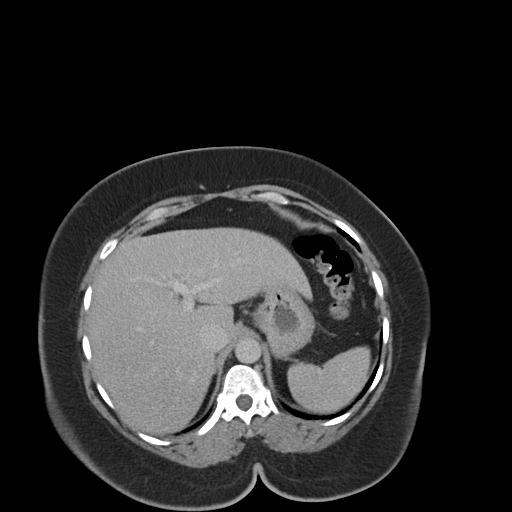
[im 81/87  soft-tissue]
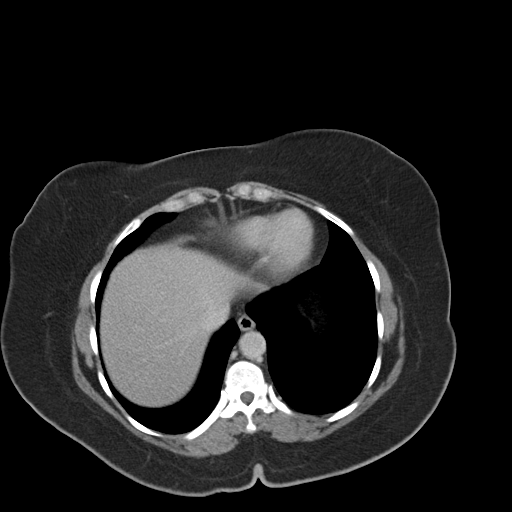

[Series 5: coronal a/|p · coronal · 0.94mm/px · 3 of 178 slices shown]
[im 60/178  soft-tissue]
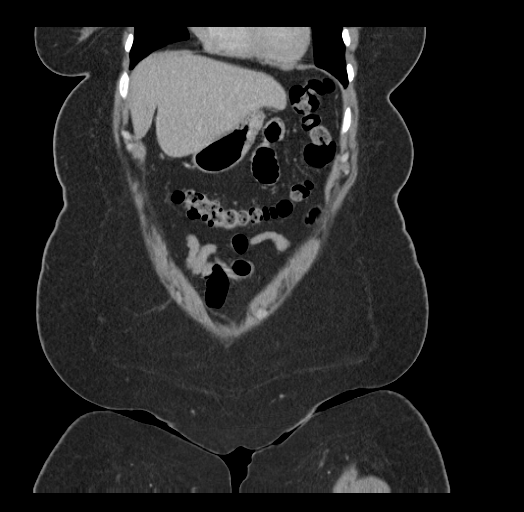
[im 79/178  soft-tissue]
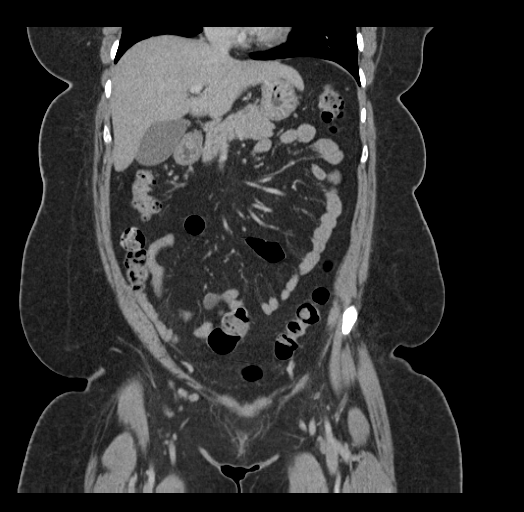
[im 99/178  soft-tissue]
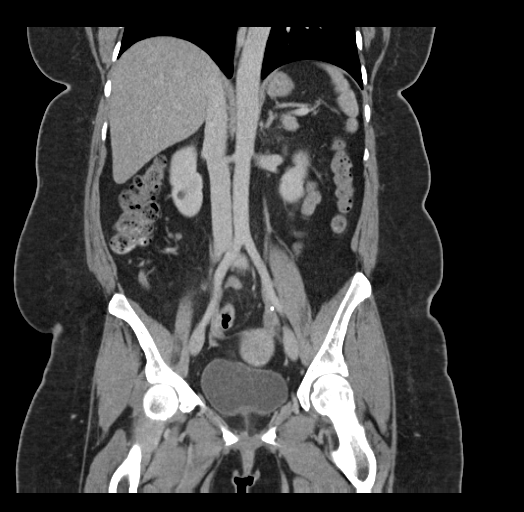

[17 of 46 positions shown; findings below may reference images not displayed]

FINDINGS: Lower chest: Visualized lung bases are unremarkable.

Hepatobiliary: No gallstones are noted.  The liver appears normal.

Pancreas: Normal.

Spleen: Normal.

Adrenals/Urinary Tract: Adrenal glands and kidneys appear normal. No
hydronephrosis or renal obstruction is noted. No renal or ureteral
calculi are noted. Urinary bladder appears normal.

Stomach/Bowel: The appendix appears normal. There is no evidence of
bowel obstruction.

Vascular/Lymphatic: No significant adenopathy is noted.

Reproductive: Uterus and ovaries are unremarkable.

Other: No abnormal fluid collection is noted.

Musculoskeletal: No significant osseous abnormality is noted.
IMPRESSION: No definite abnormality seen in the abdomen or pelvis.

## 2018-04-26 ENCOUNTER — Ambulatory Visit: Payer: 59 | Admitting: Obstetrics and Gynecology

## 2018-04-26 ENCOUNTER — Encounter: Payer: Self-pay | Admitting: Obstetrics and Gynecology

## 2018-04-26 ENCOUNTER — Other Ambulatory Visit: Payer: Self-pay

## 2018-04-26 VITALS — BP 146/78 | HR 88 | Resp 16 | Ht 64.5 in | Wt 185.0 lb

## 2018-04-26 DIAGNOSIS — N926 Irregular menstruation, unspecified: Secondary | ICD-10-CM | POA: Diagnosis not present

## 2018-04-26 DIAGNOSIS — R140 Abdominal distension (gaseous): Secondary | ICD-10-CM | POA: Diagnosis not present

## 2018-04-26 DIAGNOSIS — Z01419 Encounter for gynecological examination (general) (routine) without abnormal findings: Secondary | ICD-10-CM

## 2018-04-26 LAB — POCT URINE PREGNANCY: Preg Test, Ur: NEGATIVE

## 2018-04-26 NOTE — Patient Instructions (Signed)

## 2018-04-26 NOTE — Progress Notes (Signed)
47 y.o. G58P4 Married [place] female here for annual exam. Patient states that she is 20 days late for menses.  First time she is late.  Menses every month in the last year but can be 7 - 10 days late. No heavy bleeding.  No hot flashes.   Having a lot of gas and bloating which is very bothersome to her.  Had a gallbladder ultrasound and GB functioning testing which were normal.  Had a change in her dyspepsia medication.  Does not tolerate dairy products.   UPT - negative.   PCP: Dr. Regis Bill     Patient's last menstrual period was 03/13/2018.           Sexually active: Yes.    The current method of family planning is none.    Exercising: Yes.    walking, yoga, bike Smoker:  no  Health Maintenance: Pap:  12/22/16 Neg:Neg HR HPV History of abnormal Pap:  no MMG:  04/12/17 BIRADS 1 negative/density c Colonoscopy:  n/a BMD:   n/a  Result  n/a TDaP:  Td 08/16/2006 Gardasil:   no HIV: 12/21/16 Negative Hep C: NA. Screening Labs:  PCP   reports that she has never smoked. She has never used smokeless tobacco. She reports that she does not drink alcohol or use drugs.  Past Medical History:  Diagnosis Date  . Arthritis   . Dysmenorrhea   . Essential hypertension 12/27/2014  . Fibroid 12/23/2016   1 cm   . GERD (gastroesophageal reflux disease)   . Hyperlipidemia   . Migraine    with some aura    Past Surgical History:  Procedure Laterality Date  . DILATATION & CURETTAGE/HYSTEROSCOPY WITH MYOSURE N/A 04/04/2017   Procedure: DILATATION & CURETTAGE/HYSTEROSCOPY WITH MYOSURE,POLYPECTOMY;  Surgeon: Nunzio Cobbs, MD;  Location: [facility];  Service: Gynecology;  Laterality: N/A;  endometrial polyp  . MOUTH SURGERY    . NO PAST SURGERIES      Current Outpatient Medications  Medication Sig Dispense Refill  . acetaminophen (TYLENOL) 500 MG tablet Take 1,000 mg by mouth every 6 (six) hours as needed for moderate pain or headache.    . calcium carbonate (TUMS - DOSED IN MG  ELEMENTAL CALCIUM) 500 MG chewable tablet Chew 1 tablet by mouth daily as needed for indigestion or heartburn.    . Dexlansoprazole 30 MG capsule Take 30 mg by mouth daily.    . hydrochlorothiazide (HYDRODIURIL) 25 MG tablet Take 1 tablet (25 mg total) by mouth daily. (Patient taking differently: Take 12.5 mg by mouth daily. ) 90 tablet 0  . ibuprofen (ADVIL,MOTRIN) 800 MG tablet Take 1 tablet (800 mg total) by mouth every 8 (eight) hours as needed. 30 tablet 0  . omeprazole (PRILOSEC) 20 MG capsule Take 1 capsule (20 mg total) by mouth daily. (Patient taking differently: Take 20 mg by mouth daily as needed (acid reflux). ) 30 capsule 0  . ranitidine (ZANTAC) 150 MG tablet Take 1 tablet (150 mg total) by mouth 2 (two) times daily. 60 tablet 1   No current facility-administered medications for this visit.     Family History  Problem Relation Age of Onset  . Hypertension Mother   . Diabetes Mother   . Stroke Mother        51s [place]  2010  . Cancer Father     Review of Systems  Constitutional: Negative.   HENT: Negative.   Eyes: Negative.   Respiratory: Negative.   Cardiovascular: Negative.  Gastrointestinal: Positive for abdominal pain.  Endocrine: Negative.   Genitourinary:       Late menses  Musculoskeletal: Negative.   Skin: Negative.   Allergic/Immunologic: Negative.   Neurological: Negative.   Hematological: Negative.   Psychiatric/Behavioral: Negative.     Exam:   BP (!) 146/78 (BP Location: Right Arm, Patient Position: Sitting, Cuff Size: Large)   Pulse 88   Resp 16   Ht 5' 4.5" (1.638 m)   Wt 185 lb (83.9 kg)   LMP 03/13/2018   BMI 31.26 kg/m     General appearance: alert, cooperative and appears stated age Head: Normocephalic, without obvious abnormality, atraumatic Neck: no adenopathy, supple, symmetrical, trachea midline and thyroid normal to inspection and palpation Lungs: clear to auscultation bilaterally Breasts: normal appearance, no masses or tenderness,  No nipple retraction or dimpling, No nipple discharge or bleeding, No axillary or supraclavicular adenopathy Heart: regular rate and rhythm Abdomen: soft, non-tender; no masses, no organomegaly Extremities: extremities normal, atraumatic, no cyanosis or edema Skin: Skin color, texture, turgor normal. No rashes or lesions Lymph nodes: Cervical, supraclavicular, and axillary nodes normal. No abnormal inguinal nodes palpated Neurologic: Grossly normal  Pelvic: External genitalia:  no lesions              Urethra:  normal appearing urethra with no masses, tenderness or lesions              Bartholins and Skenes: normal                 Vagina: normal appearing vagina with normal color and discharge, no lesions              Cervix: no lesions              Pap taken: No. Bimanual Exam:  Uterus:  normal size, contour, position, consistency, mobility, non-tender              Adnexa: no mass, fullness, tenderness              Rectal exam: Yes.  .  Confirms.              Anus:  normal sphincter tone, no lesions  Chaperone was present for exam.  Assessment:   Well woman visit with normal exam. Abdominal bloating and gas.  Probably GI in origin.  Missed menses.  May be perimenopausal.  Plan: Mammogram screening. Recommended self breast awareness. Pap and HR HPV as above. Guidelines for Calcium, Vitamin D, regular exercise program including cardiovascular and weight bearing exercise. Return for pelvic US. I reinforced adding fruits and vegetables to her diet.  Call if no menses for 3 months in a row.  Will then check Armstrong, estradiol or do Provera challenge.  Follow up annually and prn.     After visit summary provided.

## 2018-04-27 ENCOUNTER — Telehealth: Payer: Self-pay | Admitting: Obstetrics and Gynecology

## 2018-04-27 NOTE — Telephone Encounter (Signed)
Call patient to review benefits for a recommended ultrasound. Left voicemail message requesting a return call.

## 2018-04-27 NOTE — Telephone Encounter (Signed)
Spoke with patient regarding benefit for recommended ultrasound. Patient understood and agreeable. Patient ready to schedule. Patient scheduled 05/11/18 with Dr Quincy Simmonds. Patient aware of appointment date, arrival time and cancellation policy. No further questions. Ok to close

## 2018-05-09 ENCOUNTER — Telehealth: Payer: Self-pay | Admitting: Obstetrics and Gynecology

## 2018-05-09 NOTE — Telephone Encounter (Signed)
Patient canceled her upcoming PUS/OV 05/11/18. Patient would like to reschedule.

## 2018-05-09 NOTE — Telephone Encounter (Signed)
Spoke with patient, PUS rescheduled to 05/25/2018 at 11:00 am with 11:30 am consult with Dr.Silva. Patient declines earlier appointment.  Routing to provider for final review. Patient agreeable to disposition. Will close encounter.

## 2018-05-11 ENCOUNTER — Other Ambulatory Visit: Payer: 59 | Admitting: Obstetrics and Gynecology

## 2018-05-11 ENCOUNTER — Other Ambulatory Visit: Payer: 59

## 2018-05-25 ENCOUNTER — Ambulatory Visit (INDEPENDENT_AMBULATORY_CARE_PROVIDER_SITE_OTHER): Payer: 59

## 2018-05-25 ENCOUNTER — Ambulatory Visit (INDEPENDENT_AMBULATORY_CARE_PROVIDER_SITE_OTHER): Payer: 59 | Admitting: Obstetrics and Gynecology

## 2018-05-25 ENCOUNTER — Encounter: Payer: Self-pay | Admitting: Obstetrics and Gynecology

## 2018-05-25 ENCOUNTER — Other Ambulatory Visit: Payer: Self-pay

## 2018-05-25 VITALS — BP 150/92 | HR 82 | Resp 14 | Ht 64.5 in | Wt 185.5 lb

## 2018-05-25 DIAGNOSIS — R140 Abdominal distension (gaseous): Secondary | ICD-10-CM

## 2018-05-25 DIAGNOSIS — D219 Benign neoplasm of connective and other soft tissue, unspecified: Secondary | ICD-10-CM

## 2018-05-25 NOTE — Patient Instructions (Signed)
Uterine Fibroids Uterine fibroids are tissue masses (tumors) that can develop in the womb (uterus). They are also called leiomyomas. This type of tumor is not cancerous (benign) and does not spread to other parts of the body outside of the pelvic area, which is between the hip bones. Occasionally, fibroids may develop in the fallopian tubes, in the cervix, or on the support structures (ligaments) that surround the uterus. You can have one or many fibroids. Fibroids can vary in size, weight, and where they grow in the uterus. Some can become quite large. Most fibroids do not require medical treatment. What are the causes? A fibroid can develop when a single uterine cell keeps growing (replicating). Most cells in the human body have a control mechanism that keeps them from replicating without control. What are the signs or symptoms? Symptoms may include:  Heavy bleeding during your period.  Bleeding or spotting between periods.  Pelvic pain and pressure.  Bladder problems, such as needing to urinate more often (urinary frequency) or urgently.  Inability to reproduce offspring (infertility).  Miscarriages.  How is this diagnosed? Uterine fibroids are diagnosed through a physical exam. Your health care provider may feel the lumpy tumors during a pelvic exam. Ultrasonography and an MRI may be done to determine the size, location, and number of fibroids. How is this treated? Treatment may include:  Watchful waiting. This involves getting the fibroid checked by your health care provider to see if it grows or shrinks. Follow your health care provider's recommendations for how often to have this checked.  Hormone medicines. These can be taken by mouth or given through an intrauterine device (IUD).  Surgery. ? Removing the fibroids (myomectomy) or the uterus (hysterectomy). ? Removing blood supply to the fibroids (uterine artery embolization).  If fibroids interfere with your fertility and you  want to become pregnant, your health care provider may recommend having the fibroids removed. Follow these instructions at home:  Keep all follow-up visits as directed by your health care provider. This is important.  Take over-the-counter and prescription medicines only as told by your health care provider. ? If you were prescribed a hormone treatment, take the hormone medicines exactly as directed.  Ask your health care provider about taking iron pills and increasing the amount of dark green, leafy vegetables in your diet. These actions can help to boost your blood iron levels, which may be affected by heavy menstrual bleeding.  Pay close attention to your period and tell your health care provider about any changes, such as: ? Increased blood flow that requires you to use more pads or tampons than usual per month. ? A change in the number of days that your period lasts per month. ? A change in symptoms that are associated with your period, such as abdominal cramping or back pain. Contact a health care provider if:  You have pelvic pain, back pain, or abdominal cramps that cannot be controlled with medicines.  You have an increase in bleeding between and during periods.  You soak tampons or pads in a half hour or less.  You feel lightheaded, extra tired, or weak. Get help right away if:  You faint.  You have a sudden increase in pelvic pain. This information is not intended to replace advice given to you by your health care provider. Make sure you discuss any questions you have with your health care provider. Document Released: 11/26/2000 Document Revised: 07/29/2016 Document Reviewed: 05/28/2014 Elsevier Interactive Patient Education  2018 Elsevier Inc.  

## 2018-05-25 NOTE — Progress Notes (Signed)
GYNECOLOGY  VISIT   HPI: 47 y.o.   Married  [place]  female   [phone] with Patient's last menstrual period was 05/08/2018.   here for   Pelvic ultrasound.  Has abdominal bloating and history of uterine fibroid. Menses currently not heavy.   GYNECOLOGIC HISTORY: Patient's last menstrual period was 05/08/2018. Contraception:  None  Menopausal hormone therapy:  no Last mammogram:  04/12/17 BIRADS 1 negative/density c  Last pap smear:   12/22/16 negative, HR HPV negative         OB History    Gravida  4   Para  4   Term      Preterm      AB      Living  4     SAB      TAB      Ectopic      Multiple      Live Births  4              Patient Active Problem List   Diagnosis Date Noted  . Degenerative joint disease of knee, left 11/01/2017  . Neck pain 06/21/2016  . Migraine headache without aura 06/21/2016  . 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 Cervical paraspinal muscle spasm 06/09/2016  . Chest pain 04/12/2016  . Patellofemoral arthralgia of left knee 05/29/2015  . Synovitis of knee 05/09/2015  . Fracture of first metatarsal bone 01/09/2015  . Right foot pain 12/27/2014  . Essential hypertension 12/27/2014  . Patellofemoral pain syndrome 08/12/2014  . Patellar pain 08/01/2014  . Pain of left lower extremity 08/01/2014  . Hx of migraine with aura 06/18/2013  . Elevated blood pressure reading 06/18/2013  . Bilateral anterior knee pain 09/23/2012  . Low back pain 09/23/2012  . Contraception management 03/18/2011  . Irregular menses 03/18/2011  . ELEVATED BLOOD PRESSURE WITHOUT DIAGNOSIS OF HYPERTENSION 03/02/2010  . AMENORRHEA 10/16/2008  . Hyperlipidemia 05/01/2008  . MIGRAINE HEADACHE 05/01/2008  . HEADACHE 05/01/2008  . LUMBAR STRAIN 05/01/2008    Past Medical History:  Diagnosis Date  . Arthritis   . Dysmenorrhea   . Essential hypertension 12/27/2014  . Fibroid 12/23/2016   1 cm   . GERD (gastroesophageal reflux disease)   .  Hyperlipidemia   . Migraine    with some aura    Past Surgical History:  Procedure Laterality Date  . DILATATION & CURETTAGE/HYSTEROSCOPY WITH MYOSURE N/A 04/04/2017   Procedure: DILATATION & CURETTAGE/HYSTEROSCOPY WITH MYOSURE,POLYPECTOMY;  Surgeon: Nunzio Cobbs, MD;  Location: [facility];  Service: Gynecology;  Laterality: N/A;  endometrial polyp  . MOUTH SURGERY    . NO PAST SURGERIES      Current Outpatient Medications  Medication Sig Dispense Refill  . acetaminophen (TYLENOL) 500 MG tablet Take 1,000 mg by mouth every 6 (six) hours as needed for moderate pain or headache.    . calcium carbonate (TUMS - DOSED IN MG ELEMENTAL CALCIUM) 500 MG chewable tablet Chew 1 tablet by mouth daily as needed for indigestion or heartburn.    . Dexlansoprazole 30 MG capsule Take 30 mg by mouth daily.    . hydrochlorothiazide (HYDRODIURIL) 25 MG tablet Take 1 tablet (25 mg total) by mouth daily. (Patient taking differently: Take 12.5 mg by mouth daily. ) 90 tablet 0  . ibuprofen (ADVIL,MOTRIN) 800 MG tablet Take 1 tablet (800 mg total) by mouth every 8 (eight) hours as needed. 30 tablet 0  . omeprazole (PRILOSEC) 20 MG capsule Take 1  capsule (20 mg total) by mouth daily. (Patient taking differently: Take 20 mg by mouth daily as needed (acid reflux). ) 30 capsule 0  . Plecanatide (TRULANCE) 3 MG TABS Take by mouth.    . ranitidine (ZANTAC) 150 MG tablet Take 1 tablet (150 mg total) by mouth 2 (two) times daily. 60 tablet 1   No current facility-administered medications for this visit.      ALLERGIES: Patient has no known allergies.  Family History  Problem Relation Age of Onset  . Hypertension Mother   . Diabetes Mother   . Stroke Mother        42s [place]  2010  . Cancer Father     Social History   Socioeconomic History  . Marital status: Married    Spouse name: Lawal  . Number of children: 4  . Years of education: 36  . Highest education level: Not on file  Occupational History    . Occupation: Unemployed  Social Needs  . Financial resource strain: Not on file  . Food insecurity:    Worry: Not on file    Inability: Not on file  . Transportation needs:    Medical: Not on file    Non-medical: Not on file  Tobacco Use  . Smoking status: Never Smoker  . Smokeless tobacco: Never Used  Substance and Sexual Activity  . Alcohol use: No  . Drug use: No  . Sexual activity: Yes    Partners: Male    Birth control/protection: None  Lifestyle  . Physical activity:    Days per week: Not on file    Minutes per session: Not on file  . Stress: Not on file  Relationships  . Social connections:    Talks on phone: Not on file    Gets together: Not on file    Attends religious service: Not on file    Active member of club or organization: Not on file    Attends meetings of clubs or organizations: Not on file    Relationship status: Not on file  . Intimate partner violence:    Fear of current or ex partner: Not on file    Emotionally abused: Not on file    Physically abused: Not on file    Forced sexual activity: Not on file  Other Topics Concern  . Not on file  Social History Narrative   Lives with husband and son, [name]   From [place]   Medical Family   HH of 6    No pets   G4 p4    Caffeine use: 1 cup coffee per day    Review of Systems  Constitutional: Negative.   HENT: Negative.   Eyes: Negative.   Respiratory: Negative.   Cardiovascular: Negative.   Gastrointestinal: Negative.   Endocrine: Negative.   Genitourinary: Negative.   Musculoskeletal: Negative.   Skin: Negative.   Allergic/Immunologic: Negative.   Neurological: Negative.   Hematological: Negative.   Psychiatric/Behavioral: Negative.     PHYSICAL EXAMINATION:    BP (!) 150/92 (BP Location: Left Arm, Patient Position: Sitting, Cuff Size: Normal)   Pulse 82   Resp 14   Ht 5' 4.5" (1.638 m)   Wt 185 lb 8 oz (84.1 kg)   LMP 05/08/2018   BMI 31.35 kg/m     General appearance:  alert, cooperative and appears stated age   Pelvic US Uterus with 3 fibroids:  1 cm, 1 cm, 2 cm.  EMS 3.66 mm.  Normal ovaries.  No free fluid.  Chaperone was present for exam.  ASSESSMENT  Abdominal bloating.  Uterine fibroids.  PLAN  We reviewed fibroids - benign nature, signs and symptoms, life history.  She will call if she develops signs and symptoms.  I recommend completion of GI work up for her bloating.  I do not believe this is GYN related.   An After Visit Summary was printed and given to the patient.  __15____ minutes face to face time of which over 50% was spent in counseling.

## 2018-05-29 ENCOUNTER — Other Ambulatory Visit: Payer: Self-pay | Admitting: Internal Medicine

## 2018-05-29 DIAGNOSIS — Z1231 Encounter for screening mammogram for malignant neoplasm of breast: Secondary | ICD-10-CM

## 2018-06-16 ENCOUNTER — Ambulatory Visit
Admission: RE | Admit: 2018-06-16 | Discharge: 2018-06-16 | Disposition: A | Discharge status: Home / Self Care | Payer: 59 | Source: Ambulatory Visit | Attending: Internal Medicine | Admitting: Internal Medicine

## 2018-06-16 DIAGNOSIS — Z1231 Encounter for screening mammogram for malignant neoplasm of breast: Secondary | ICD-10-CM | POA: Diagnosis not present

## 2018-09-05 DIAGNOSIS — H00024 Hordeolum internum left upper eyelid: Secondary | ICD-10-CM | POA: Diagnosis not present

## 2018-09-13 DIAGNOSIS — H524 Presbyopia: Secondary | ICD-10-CM | POA: Diagnosis not present

## 2018-09-13 DIAGNOSIS — H52223 Regular astigmatism, bilateral: Secondary | ICD-10-CM | POA: Diagnosis not present

## 2018-12-14 DIAGNOSIS — K600 Acute anal fissure: Secondary | ICD-10-CM | POA: Diagnosis not present

## 2018-12-14 DIAGNOSIS — K5904 Chronic idiopathic constipation: Secondary | ICD-10-CM | POA: Diagnosis not present

## 2018-12-14 DIAGNOSIS — Z1211 Encounter for screening for malignant neoplasm of colon: Secondary | ICD-10-CM | POA: Diagnosis not present

## 2018-12-14 DIAGNOSIS — K625 Hemorrhage of anus and rectum: Secondary | ICD-10-CM | POA: Diagnosis not present

## 2018-12-14 DIAGNOSIS — K219 Gastro-esophageal reflux disease without esophagitis: Secondary | ICD-10-CM | POA: Diagnosis not present

## 2018-12-15 LAB — HEPATIC FUNCTION PANEL
ALT: 15 (ref 7–35)
AST: 16 (ref 13–35)
Alkaline Phosphatase: 73 (ref 25–125)
Bilirubin, Total: 0.4

## 2018-12-15 LAB — CBC AND DIFFERENTIAL
HCT: 40 (ref 36–46)
Hemoglobin: 12.7 (ref 12.0–16.0)
Platelets: 342 (ref 150–399)
WBC: 7.1

## 2018-12-15 LAB — BASIC METABOLIC PANEL
BUN: 7 (ref 4–21)
Creatinine: 0.6 (ref 0.5–1.1)
Glucose: 93
Potassium: 3.5 (ref 3.4–5.3)
Sodium: 139 (ref 137–147)

## 2018-12-22 DIAGNOSIS — Z1211 Encounter for screening for malignant neoplasm of colon: Secondary | ICD-10-CM | POA: Diagnosis not present

## 2018-12-22 DIAGNOSIS — D123 Benign neoplasm of transverse colon: Secondary | ICD-10-CM | POA: Diagnosis not present

## 2018-12-22 DIAGNOSIS — K635 Polyp of colon: Secondary | ICD-10-CM | POA: Diagnosis not present

## 2018-12-22 LAB — HM COLONOSCOPY

## 2018-12-29 ENCOUNTER — Encounter: Payer: Self-pay | Admitting: Internal Medicine

## 2019-05-03 ENCOUNTER — Encounter: Payer: Self-pay | Admitting: Internal Medicine

## 2019-05-14 ENCOUNTER — Telehealth: Payer: Self-pay | Admitting: Obstetrics and Gynecology

## 2019-05-14 NOTE — Telephone Encounter (Signed)
Left message on voicemail to call and reschedule cancelled appointment. °

## 2019-05-25 ENCOUNTER — Ambulatory Visit: Payer: 59 | Admitting: Obstetrics and Gynecology

## 2019-06-26 ENCOUNTER — Other Ambulatory Visit: Payer: Self-pay

## 2019-06-26 ENCOUNTER — Encounter (INDEPENDENT_AMBULATORY_CARE_PROVIDER_SITE_OTHER): Payer: Self-pay | Admitting: Primary Care

## 2019-06-26 ENCOUNTER — Ambulatory Visit (INDEPENDENT_AMBULATORY_CARE_PROVIDER_SITE_OTHER): Payer: 59 | Admitting: Primary Care

## 2019-06-26 DIAGNOSIS — I10 Essential (primary) hypertension: Secondary | ICD-10-CM

## 2019-06-26 DIAGNOSIS — Z7689 Persons encountering health services in other specified circumstances: Secondary | ICD-10-CM | POA: Diagnosis not present

## 2019-06-26 DIAGNOSIS — M1712 Unilateral primary osteoarthritis, left knee: Secondary | ICD-10-CM

## 2019-06-26 MED ORDER — IBUPROFEN 800 MG PO TABS: 600.0000 mg | ORAL_TABLET | Freq: Three times a day (TID) | ORAL | 1 refills | Status: DC | PRN

## 2019-06-26 NOTE — Progress Notes (Signed)
Virtual Visit via Telephone Note  I connected with Kerry Dory on 06/26/19 at  2:30 PM EDT by telephone and verified that I am speaking with the correct person using two identifiers.   I discussed the limitations, risks, security and privacy concerns of performing an evaluation and management service by telephone and the availability of in person appointments. I also discussed with the patient that there may be a patient responsible charge related to this service. The patient expressed understanding and agreed to proceed.   History of Present Illness: Mrs. ZOHA SPRANGER presents to establish care. She has been having left knee pain for 3-4 years . Previous treatment was steroid injection. She is complaining of difficulty walking and weakness in her left leg.PMH: Hypertension, Osteoarthritis, hyperlipidemia, and GERD.    Observations/Objective: Review of Systems  Musculoskeletal:       Left knee pain causing difficulty to walk pain is constant    Assessment and Plan: Reianna was seen today for establish care.  Diagnoses and all orders for this visit:  Encounter to establish care Establishing  care main concern is knee pain this has been present for several years . Now the pain is affecting her walking and driving difficulty bending her knee.  Osteoarthritis of left knee, unspecified osteoarthritis type -     MR Knee Left  Wo Contrast; Future  Essential hypertension Counseled on blood pressure goal of less than 130/80, low-sodium, DASH diet, medication compliance, 150 minutes of moderate intensity exercise per week. Discussed medication compliance, adverse effects.  Other orders -     ibuprofen (ADVIL) 800 MG tablet; Take 1 tablet (800 mg total) by mouth every 8 (eight) hours as needed.    Follow Up Instructions:    I discussed the assessment and treatment plan with the patient. The patient was provided an opportunity to ask questions and all were answered. The patient  agreed with the plan and demonstrated an understanding of the instructions.   The patient was advised to call back or seek an in-person evaluation if the symptoms worsen or if the condition fails to improve as anticipated.  I provided 15 minutes of non-face-to-face time during this encounter.   Kerin Perna, NP

## 2020-01-10 ENCOUNTER — Telehealth (INDEPENDENT_AMBULATORY_CARE_PROVIDER_SITE_OTHER): Payer: 59 | Admitting: Internal Medicine

## 2020-01-10 ENCOUNTER — Encounter: Payer: Self-pay | Admitting: Internal Medicine

## 2020-01-10 ENCOUNTER — Other Ambulatory Visit: Payer: Self-pay

## 2020-01-10 DIAGNOSIS — M545 Low back pain, unspecified: Secondary | ICD-10-CM

## 2020-01-10 NOTE — Progress Notes (Signed)
Virtual Visit via Video Note  I connected with@ on 01/10/20 at 10:30 AM EST by a video enabled telemedicine application and verified that I am speaking with the correct person using two identifiers. Location patient: home Location provider: home office Persons participating in the virtual visit: patient, provider  [place] national recommendations  regarding COVID 19 pandemic   video visit is advised over in office visit for this patient.  Patient aware  of the limitations of evaluation and management by telemedicine and  availability of in person appointments. and agreed to proceed.   HPI: Teresa Hickman presents for video visit Because she has been having new onset of 4 to 5 days of lower back pain that radiates down the back of her leg to the knee and the foot.  Sometimes she gets a minor version of this short-lived at the beginning of a.  But has not been problematic. This time it is severe wakes her up at night and she has to take Aleve 1 every 8 hours to feel better This.  Is on time but later than normal so far denies risk of pregnancy her last GYN check may have been 2019 but otherwise no problem Has no history of injury falling states her leg feels weak but because of pain. No fever or UTI symptoms. Denies abdominal pain at this time.    pmh has small fibroids  ROS: See pertinent positives and negatives per HPI.  Past Medical History:  Diagnosis Date  . Arthritis   . Dysmenorrhea   . Essential hypertension 12/27/2014  . Fibroid 12/23/2016   1 cm   . GERD (gastroesophageal reflux disease)   . Hyperlipidemia   . Migraine    with some aura    Past Surgical History:  Procedure Laterality Date  . DILATATION & CURETTAGE/HYSTEROSCOPY WITH MYOSURE N/A 04/04/2017   Procedure: DILATATION & CURETTAGE/HYSTEROSCOPY WITH MYOSURE,POLYPECTOMY;  Surgeon: Nunzio Cobbs, MD;  Location: [facility];  Service: Gynecology;  Laterality: N/A;  endometrial polyp  . MOUTH SURGERY    . NO  PAST SURGERIES      Family History  Problem Relation Age of Onset  . Hypertension Mother   . Diabetes Mother   . Stroke Mother        32s Teresa Hickman  2010  . Cancer Father     Social History   Tobacco Use  . Smoking status: Never Smoker  . Smokeless tobacco: Never Used  Substance Use Topics  . Alcohol use: No  . Drug use: No      Current Outpatient Medications:  .  acetaminophen (TYLENOL) 500 MG tablet, Take 1,000 mg by mouth every 6 (six) hours as needed for moderate pain or headache., Disp: , Rfl:  .  calcium carbonate (TUMS - DOSED IN MG ELEMENTAL CALCIUM) 500 MG chewable tablet, Chew 1 tablet by mouth daily as needed for indigestion or heartburn., Disp: , Rfl:  .  Dexlansoprazole 30 MG capsule, Take 30 mg by mouth daily., Disp: , Rfl:  .  hydrochlorothiazide (HYDRODIURIL) 25 MG tablet, Take 1 tablet (25 mg total) by mouth daily. (Patient taking differently: Take 12.5 mg by mouth daily. ), Disp: 90 tablet, Rfl: 0 .  ibuprofen (ADVIL) 800 MG tablet, Take 1 tablet (800 mg total) by mouth every 8 (eight) hours as needed., Disp: 90 tablet, Rfl: 1  EXAM: BP Readings from Last 3 Encounters:  05/25/18 (!) 150/92  04/26/18 (!) 146/78  12/27/17 128/82    VITALS per patient  if applicable: no fever looiks well walking  Ok mobility ok sound reception cut out in middle of visit   soe finished with phone.   GENERAL: alert, oriented, appears well and in no acute distress  HEENT: atraumatic, conjunttiva clear, no obvious abnormalities on inspection of external nose and ears  NECK: normal movements of the head and neck  LUNGS: on inspection no signs of respiratory distress, breathing rate appears normal, no obvious gross SOB, gasping or wheezing  CV: no obvious cyanosis  MS: moves all visible extremities without noticeable abnormality  PSYCH/NEURO: pleasant and cooperative, no obvious depression or anxiety, speech and thought processing grossly intact   ASSESSMENT AND  PLAN:  Discussed the following assessment and plan:    ICD-10-CM   1. Low back pain with radiation  M54.5    Low back pain with radiation ? If catamenial vs other no other alarm features  .  At this time we will approach this as mechanical sciatica. Consideration of endometriosis because of its timing and course and context. At this time she can go ahead and continue Aleve 1 every 8 hours or 2 twice a day at the anti-inflammatory dose with GI protection for 7 to 10 days or as needed. In a week or so if she is not improving to let us know consider in person visit and or work referral for physical therapy. I want her to call and get an appointment with her GYN Dr. Quincy Simmonds to evaluate since it last seen in June 2019 to ensure no significant GYN problems causing the symptoms. She will call for appointment I will send a copy of the note with recommendations.  Counseled.   Expectant management and discussion of plan and treatment with opportunity to ask questions and all were answered. The patient agreed with the plan and demonstrated an understanding of the instructions.   Advised to call back or seek an in-person evaluation if worsening  or having  further concerns . Return if symptoms worsen or fail to improve, for and 1-2 weeks if not better   and make gyne appt.Shanon Ace, MD   Outside external source  DATA REVIEWED:  Last gyne check  Lab   Total time on date  of service including record review ordering and plan of care:  30 minutes referring her to get appt with GYNE 32 minutes

## 2020-03-18 ENCOUNTER — Other Ambulatory Visit: Payer: Self-pay

## 2020-03-19 ENCOUNTER — Ambulatory Visit: Payer: 59 | Admitting: Obstetrics and Gynecology

## 2020-03-19 NOTE — Progress Notes (Deleted)
49 y.o. G44P4 Married nigerian female here for annual exam.    PCP:     No LMP recorded.           Sexually active: {yes no:314532}  The current method of family planning is {contraception:315051}.    Exercising: {yes no:314532}  {types:19826} Smoker:  no  Health Maintenance: Pap:  12/22/16 Neg:Neg HR HPV, 03-01-13 Neg:Neg HR HPV History of abnormal Pap:  {YES NO:22349} MMG:  ***06-16-18 3D/Neg/density B/BiRads1 Colonoscopy: ***12-22-18 polyp;next due  BMD:   n/a  Result  n/a TDaP: ***08-16-06 Td Gardasil:   no HIV:12-31-16 NR Hep C: no Screening Labs:  Hb today: ***, Urine today: ***   reports that she has never smoked. She has never used smokeless tobacco. She reports that she does not drink alcohol or use drugs.  Past Medical History:  Diagnosis Date  . Arthritis   . Dysmenorrhea   . Essential hypertension 12/27/2014  . Fibroid 12/23/2016   1 cm   . GERD (gastroesophageal reflux disease)   . Hyperlipidemia   . Migraine    with some aura    Past Surgical History:  Procedure Laterality Date  . DILATATION & CURETTAGE/HYSTEROSCOPY WITH MYOSURE N/A 04/04/2017   Procedure: DILATATION & CURETTAGE/HYSTEROSCOPY WITH MYOSURE,POLYPECTOMY;  Surgeon: Nunzio Cobbs, MD;  Location: [facility];  Service: Gynecology;  Laterality: N/A;  endometrial polyp  . MOUTH SURGERY    . NO PAST SURGERIES      Current Outpatient Medications  Medication Sig Dispense Refill  . acetaminophen (TYLENOL) 500 MG tablet Take 1,000 mg by mouth every 6 (six) hours as needed for moderate pain or headache.    . calcium carbonate (TUMS - DOSED IN MG ELEMENTAL CALCIUM) 500 MG chewable tablet Chew 1 tablet by mouth daily as needed for indigestion or heartburn.    . Dexlansoprazole 30 MG capsule Take 30 mg by mouth daily.    . hydrochlorothiazide (HYDRODIURIL) 25 MG tablet Take 1 tablet (25 mg total) by mouth daily. (Patient taking differently: Take 12.5 mg by mouth daily. ) 90 tablet 0  . ibuprofen (ADVIL)  800 MG tablet Take 1 tablet (800 mg total) by mouth every 8 (eight) hours as needed. 90 tablet 1   No current facility-administered medications for this visit.    Family History  Problem Relation Age of Onset  . Hypertension Mother   . Diabetes Mother   . Stroke Mother        55s [place]  2010  . Cancer Father     Review of Systems  Exam:   There were no vitals taken for this visit.    General appearance: alert, cooperative and appears stated age Head: normocephalic, without obvious abnormality, atraumatic Neck: no adenopathy, supple, symmetrical, trachea midline and thyroid normal to inspection and palpation Lungs: clear to auscultation bilaterally Breasts: normal appearance, no masses or tenderness, No nipple retraction or dimpling, No nipple discharge or bleeding, No axillary adenopathy Heart: regular rate and rhythm Abdomen: soft, non-tender; no masses, no organomegaly Extremities: extremities normal, atraumatic, no cyanosis or edema Skin: skin color, texture, turgor normal. No rashes or lesions Lymph nodes: cervical, supraclavicular, and axillary nodes normal. Neurologic: grossly normal  Pelvic: External genitalia:  no lesions              No abnormal inguinal nodes palpated.              Urethra:  normal appearing urethra with no masses, tenderness or lesions  Bartholins and Skenes: normal                 Vagina: normal appearing vagina with normal color and discharge, no lesions              Cervix: no lesions              Pap taken: {yes no:314532} Bimanual Exam:  Uterus:  normal size, contour, position, consistency, mobility, non-tender              Adnexa: no mass, fullness, tenderness              Rectal exam: {yes no:314532}.  Confirms.              Anus:  normal sphincter tone, no lesions  Chaperone was present for exam.  Assessment:   Well woman visit with normal exam.   Plan: Mammogram screening discussed. Self breast awareness reviewed. Pap  and HR HPV as above. Guidelines for Calcium, Vitamin D, regular exercise program including cardiovascular and weight bearing exercise.   Follow up annually and prn.   Additional counseling given.  {yes [id number]. _______ minutes face to face time of which over 50% was spent in counseling.    After visit summary provided.

## 2020-03-20 NOTE — Progress Notes (Deleted)
49 y.o. G41P4 Married [place] female here for annual exam.    PCP:     No LMP recorded.           Sexually active: {yes no:314532}  The current method of family planning is {contraception:315051}.    Exercising: {yes no:314532}  {types:19826} Smoker:  no  Health Maintenance: Pap: 12/22/16 Neg:Neg HR HPV, 03-01-13 Neg:Neg Hr HPV History of abnormal Pap:  {YES NO:22349} MMG:  ***06-16-18 3D/Neg/density B/BiRads1 Colonoscopy:*** 12-22-18 polyp;next due ? BMD:   n/a  Result  n/a TDaP:  ***08-16-06 Td Gardasil:   no HIV: 12-21-16 NR Hep C:no Screening Labs:  Hb today: ***, Urine today: ***   reports that she has never smoked. She has never used smokeless tobacco. She reports that she does not drink alcohol or use drugs.  Past Medical History:  Diagnosis Date  . Arthritis   . Dysmenorrhea   . Essential hypertension 12/27/2014  . Fibroid 12/23/2016   1 cm   . GERD (gastroesophageal reflux disease)   . Hyperlipidemia   . Migraine    with some aura    Past Surgical History:  Procedure Laterality Date  . DILATATION & CURETTAGE/HYSTEROSCOPY WITH MYOSURE N/A 04/04/2017   Procedure: DILATATION & CURETTAGE/HYSTEROSCOPY WITH MYOSURE,POLYPECTOMY;  Surgeon: Nunzio Cobbs, MD;  Location: [facility];  Service: Gynecology;  Laterality: N/A;  endometrial polyp  . MOUTH SURGERY    . NO PAST SURGERIES      Current Outpatient Medications  Medication Sig Dispense Refill  . acetaminophen (TYLENOL) 500 MG tablet Take 1,000 mg by mouth every 6 (six) hours as needed for moderate pain or headache.    . calcium carbonate (TUMS - DOSED IN MG ELEMENTAL CALCIUM) 500 MG chewable tablet Chew 1 tablet by mouth daily as needed for indigestion or heartburn.    . Dexlansoprazole 30 MG capsule Take 30 mg by mouth daily.    . hydrochlorothiazide (HYDRODIURIL) 25 MG tablet Take 1 tablet (25 mg total) by mouth daily. (Patient taking differently: Take 12.5 mg by mouth daily. ) 90 tablet 0  . ibuprofen (ADVIL)  800 MG tablet Take 1 tablet (800 mg total) by mouth every 8 (eight) hours as needed. 90 tablet 1   No current facility-administered medications for this visit.    Family History  Problem Relation Age of Onset  . Hypertension Mother   . Diabetes Mother   . Stroke Mother        44s [place]  2010  . Cancer Father     Review of Systems  Exam:   There were no vitals taken for this visit.    General appearance: alert, cooperative and appears stated age Head: normocephalic, without obvious abnormality, atraumatic Neck: no adenopathy, supple, symmetrical, trachea midline and thyroid normal to inspection and palpation Lungs: clear to auscultation bilaterally Breasts: normal appearance, no masses or tenderness, No nipple retraction or dimpling, No nipple discharge or bleeding, No axillary adenopathy Heart: regular rate and rhythm Abdomen: soft, non-tender; no masses, no organomegaly Extremities: extremities normal, atraumatic, no cyanosis or edema Skin: skin color, texture, turgor normal. No rashes or lesions Lymph nodes: cervical, supraclavicular, and axillary nodes normal. Neurologic: grossly normal  Pelvic: External genitalia:  no lesions              No abnormal inguinal nodes palpated.              Urethra:  normal appearing urethra with no masses, tenderness or lesions  Bartholins and Skenes: normal                 Vagina: normal appearing vagina with normal color and discharge, no lesions              Cervix: no lesions              Pap taken: {yes no:314532} Bimanual Exam:  Uterus:  normal size, contour, position, consistency, mobility, non-tender              Adnexa: no mass, fullness, tenderness              Rectal exam: {yes no:314532}.  Confirms.              Anus:  normal sphincter tone, no lesions  Chaperone was present for exam.  Assessment:   Well woman visit with normal exam.   Plan: Mammogram screening discussed. Self breast awareness reviewed. Pap  and HR HPV as above. Guidelines for Calcium, Vitamin D, regular exercise program including cardiovascular and weight bearing exercise.   Follow up annually and prn.   Additional counseling given.  {yes [id number]. _______ minutes face to face time of which over 50% was spent in counseling.    After visit summary provided.

## 2020-03-24 ENCOUNTER — Ambulatory Visit: Payer: 59 | Admitting: Obstetrics and Gynecology

## 2020-04-07 ENCOUNTER — Ambulatory Visit (INDEPENDENT_AMBULATORY_CARE_PROVIDER_SITE_OTHER): Payer: 59 | Admitting: Primary Care

## 2020-04-07 ENCOUNTER — Other Ambulatory Visit (INDEPENDENT_AMBULATORY_CARE_PROVIDER_SITE_OTHER): Payer: Self-pay | Admitting: Primary Care

## 2020-04-07 ENCOUNTER — Encounter (INDEPENDENT_AMBULATORY_CARE_PROVIDER_SITE_OTHER): Payer: Self-pay | Admitting: Primary Care

## 2020-04-07 ENCOUNTER — Other Ambulatory Visit: Payer: Self-pay

## 2020-04-07 DIAGNOSIS — R591 Generalized enlarged lymph nodes: Secondary | ICD-10-CM | POA: Diagnosis not present

## 2020-04-07 DIAGNOSIS — Z800 Family history of malignant neoplasm of digestive organs: Secondary | ICD-10-CM

## 2020-04-07 DIAGNOSIS — R590 Localized enlarged lymph nodes: Secondary | ICD-10-CM

## 2020-04-07 DIAGNOSIS — R599 Enlarged lymph nodes, unspecified: Secondary | ICD-10-CM | POA: Diagnosis not present

## 2020-04-07 NOTE — Progress Notes (Signed)
Virtual Visit via Telephone Note  I connected with Teresa Hickman on 04/07/20 at  8:30 AM EDT by telephone and verified that I am speaking with the correct person using two identifiers.   I discussed the limitations, risks, security and privacy concerns of performing an evaluation and management service by telephone and the availability of in person appointments. I also discussed with the patient that there may be a patient responsible charge related to this service. The patient expressed understanding and agreed to proceed.   History of Present Illness: [name] is a 49 year old African American  female having a tele visit for an acute visit. She incidently   found a enlarge area in her left side of her neck. She is unsure of the  length of time. She is certain it appeared after her second COVID vaccine. Past Medical History:  Diagnosis Date  . Arthritis   . Dysmenorrhea   . Essential hypertension 12/27/2014  . Fibroid 12/23/2016   1 cm   . GERD (gastroesophageal reflux disease)   . Hyperlipidemia   . Migraine    with some aura   Current Outpatient Medications on File Prior to Visit  Medication Sig Dispense Refill  . acetaminophen (TYLENOL) 500 MG tablet Take 1,000 mg by mouth every 6 (six) hours as needed for moderate pain or headache.    . calcium carbonate (TUMS - DOSED IN MG ELEMENTAL CALCIUM) 500 MG chewable tablet Chew 1 tablet by mouth daily as needed for indigestion or heartburn.    . Dexlansoprazole 30 MG capsule Take 30 mg by mouth daily.    . hydrochlorothiazide (HYDRODIURIL) 25 MG tablet Take 1 tablet (25 mg total) by mouth daily. (Patient taking differently: Take 12.5 mg by mouth daily. ) 90 tablet 0  . ibuprofen (ADVIL) 800 MG tablet Take 1 tablet (800 mg total) by mouth every 8 (eight) hours as needed. 90 tablet 1   No current facility-administered medications on file prior to visit.   Observations/Objective: Review of Systems  Musculoskeletal:   Enlarge area on left side of neck   All other systems reviewed and are negative.  Assessment and Plan: Teresa Hickman was seen today for mass.  Diagnoses and all orders for this visit:  Lymphadenopathy of head and neck  -     US Soft Tissue Head/Neck (NON-THYROID); Future  Enlarged lymph nodes -     US Soft Tissue Head/Neck (NON-THYROID); Future  Family history of pancreatic cancer -    -     US Soft Tissue Head/Neck (NON-THYROID); Future    Follow Up Instructions:    I discussed the assessment and treatment plan with the patient. The patient was provided an opportunity to ask questions and all were answered. The patient agreed with the plan and demonstrated an understanding of the instructions.   The patient was advised to call back or seek an in-person evaluation if the symptoms worsen or if the condition fails to improve as anticipated.  I provided 8 minutes of non-face-to-face time during this encounter.   Kerin Perna, NP

## 2020-04-08 ENCOUNTER — Ambulatory Visit
Admission: RE | Admit: 2020-04-08 | Discharge: 2020-04-08 | Disposition: A | Discharge status: Home / Self Care | Payer: 59 | Source: Ambulatory Visit | Attending: Primary Care | Admitting: Primary Care

## 2020-04-08 DIAGNOSIS — Z800 Family history of malignant neoplasm of digestive organs: Secondary | ICD-10-CM

## 2020-04-08 DIAGNOSIS — R590 Localized enlarged lymph nodes: Secondary | ICD-10-CM

## 2020-04-08 DIAGNOSIS — R599 Enlarged lymph nodes, unspecified: Secondary | ICD-10-CM | POA: Diagnosis not present

## 2020-04-08 DIAGNOSIS — R591 Generalized enlarged lymph nodes: Secondary | ICD-10-CM

## 2020-04-08 DIAGNOSIS — R221 Localized swelling, mass and lump, neck: Secondary | ICD-10-CM | POA: Diagnosis not present

## 2020-04-08 MED ORDER — IOPAMIDOL (ISOVUE-300) INJECTION 61%
100.0000 mL | Freq: Once | INTRAVENOUS | Status: AC | PRN
  Administered 2020-04-08: 15:00:00 100 mL via INTRAVENOUS

## 2020-04-09 ENCOUNTER — Other Ambulatory Visit (INDEPENDENT_AMBULATORY_CARE_PROVIDER_SITE_OTHER): Payer: Self-pay | Admitting: Primary Care

## 2020-04-09 ENCOUNTER — Other Ambulatory Visit: Payer: Self-pay | Admitting: Primary Care

## 2020-04-09 DIAGNOSIS — R221 Localized swelling, mass and lump, neck: Secondary | ICD-10-CM | POA: Diagnosis not present

## 2020-04-09 DIAGNOSIS — Z1231 Encounter for screening mammogram for malignant neoplasm of breast: Secondary | ICD-10-CM

## 2020-04-09 DIAGNOSIS — R590 Localized enlarged lymph nodes: Secondary | ICD-10-CM | POA: Diagnosis not present

## 2020-04-17 DIAGNOSIS — R921 Mammographic calcification found on diagnostic imaging of breast: Secondary | ICD-10-CM | POA: Diagnosis not present

## 2020-04-17 DIAGNOSIS — R590 Localized enlarged lymph nodes: Secondary | ICD-10-CM | POA: Diagnosis not present

## 2020-04-17 DIAGNOSIS — R599 Enlarged lymph nodes, unspecified: Secondary | ICD-10-CM | POA: Diagnosis not present

## 2020-04-17 DIAGNOSIS — N6489 Other specified disorders of breast: Secondary | ICD-10-CM | POA: Diagnosis not present

## 2020-04-23 NOTE — Progress Notes (Deleted)
49 y.o. G20P4 Married [place] female here for annual exam.    PCP:     No LMP recorded.           Sexually active: {yes no:314532}  The current method of family planning is {contraception:315051}.    Exercising: {yes no:314532}  {types:19826} Smoker:  no  Health Maintenance: Pap: 12/22/16 Neg:Neg HR HPV, 03-01-13 Neg:Neg HR HPV, 03-02-10 Neg History of abnormal Pap:  no MMG: 06-16-18 3D/Neg/density B/BiRads1 Colonoscopy:  BMD:  N/a  Result n/a TDaP: *** Td 08-16-06 Gardasil:   {YES NO:22349} HIV: 12-21-16 NR Hep C:*** Screening Labs:  Hb today: ***, Urine today: ***   reports that she has never smoked. She has never used smokeless tobacco. She reports that she does not drink alcohol or use drugs.  Past Medical History:  Diagnosis Date  . Arthritis   . Dysmenorrhea   . Essential hypertension 12/27/2014  . Fibroid 12/23/2016   1 cm   . GERD (gastroesophageal reflux disease)   . Hyperlipidemia   . Migraine    with some aura    Past Surgical History:  Procedure Laterality Date  . DILATATION & CURETTAGE/HYSTEROSCOPY WITH MYOSURE N/A 04/04/2017   Procedure: DILATATION & CURETTAGE/HYSTEROSCOPY WITH MYOSURE,POLYPECTOMY;  Surgeon: Nunzio Cobbs, MD;  Location: [facility];  Service: Gynecology;  Laterality: N/A;  endometrial polyp  . MOUTH SURGERY    . NO PAST SURGERIES      Current Outpatient Medications  Medication Sig Dispense Refill  . acetaminophen (TYLENOL) 500 MG tablet Take 1,000 mg by mouth every 6 (six) hours as needed for moderate pain or headache.    . calcium carbonate (TUMS - DOSED IN MG ELEMENTAL CALCIUM) 500 MG chewable tablet Chew 1 tablet by mouth daily as needed for indigestion or heartburn.    . Dexlansoprazole 30 MG capsule Take 30 mg by mouth daily.    . hydrochlorothiazide (HYDRODIURIL) 25 MG tablet Take 1 tablet (25 mg total) by mouth daily. (Patient taking differently: Take 12.5 mg by mouth daily. ) 90 tablet 0  . ibuprofen (ADVIL) 800 MG tablet Take 1  tablet (800 mg total) by mouth every 8 (eight) hours as needed. 90 tablet 1   No current facility-administered medications for this visit.    Family History  Problem Relation Age of Onset  . Hypertension Mother   . Diabetes Mother   . Stroke Mother        48s [place]  2010  . Cancer Father     Review of Systems  Exam:   There were no vitals taken for this visit.    General appearance: alert, cooperative and appears stated age Head: normocephalic, without obvious abnormality, atraumatic Neck: no adenopathy, supple, symmetrical, trachea midline and thyroid normal to inspection and palpation Lungs: clear to auscultation bilaterally Breasts: normal appearance, no masses or tenderness, No nipple retraction or dimpling, No nipple discharge or bleeding, No axillary adenopathy Heart: regular rate and rhythm Abdomen: soft, non-tender; no masses, no organomegaly Extremities: extremities normal, atraumatic, no cyanosis or edema Skin: skin color, texture, turgor normal. No rashes or lesions Lymph nodes: cervical, supraclavicular, and axillary nodes normal. Neurologic: grossly normal  Pelvic: External genitalia:  no lesions              No abnormal inguinal nodes palpated.              Urethra:  normal appearing urethra with no masses, tenderness or lesions  Bartholins and Skenes: normal                 Vagina: normal appearing vagina with normal color and discharge, no lesions              Cervix: no lesions              Pap taken: {yes no:314532} Bimanual Exam:  Uterus:  normal size, contour, position, consistency, mobility, non-tender              Adnexa: no mass, fullness, tenderness              Rectal exam: {yes no:314532}.  Confirms.              Anus:  normal sphincter tone, no lesions  Chaperone was present for exam.  Assessment:   Well woman visit with normal exam.   Plan: Mammogram screening discussed. Self breast awareness reviewed. Pap and HR HPV as  above. Guidelines for Calcium, Vitamin D, regular exercise program including cardiovascular and weight bearing exercise.   Follow up annually and prn.   Additional counseling given.  {yes [id number]. _______ minutes face to face time of which over 50% was spent in counseling.    After visit summary provided.

## 2020-04-25 ENCOUNTER — Other Ambulatory Visit: Payer: 59

## 2020-04-28 ENCOUNTER — Ambulatory Visit: Payer: 59 | Admitting: Obstetrics and Gynecology

## 2020-04-30 DIAGNOSIS — R590 Localized enlarged lymph nodes: Secondary | ICD-10-CM | POA: Diagnosis not present

## 2020-05-01 NOTE — Progress Notes (Signed)
49 y.o. G9P4 Married [place] female here for annual exam.    She skipped one month of menses in February, 2021. Some pain with cycle and uses heat and Aleve.   Received her Covid vaccine, second injection was the beginning of May, 2021.  She had lymph node enlargement and saw a Psychologist, sport and exercise.  She had a CT of the abdomen and chest which identified several enalrged lymph noted of the left axillary, subpectoral and supraclavicular region.  She will need follow up imaging in August 2021.   [id number] Panosh, MD   Patient's last menstrual period was 04/29/2020 (approximate).           Sexually active: Yes.    The current method of family planning is none.    Exercising: Yes.    walking and biking Smoker:  no  Health Maintenance: Pap: 12/22/16 Neg:Neg HR HPV, 03-01-13 Neg:Neg HR HPV History of abnormal Pap:  no MMG: 04-17-20 Diag.Bil./Probable benign findings/BiRads3/3 mo.follow up US left axilla rec. Colonoscopy: 2020 normal per patient;next 10 yrs BMD:   n/a  Result  n/a TDaP: Td 2007--Declines Gardasil:   no HIV: 12-21-16 NR Hep C:no Screening Labs:  PCP.   reports that she has never smoked. She has never used smokeless tobacco. She reports that she does not drink alcohol or use drugs.  Past Medical History:  Diagnosis Date  . Arthritis   . Dysmenorrhea   . Essential hypertension 12/27/2014  . Fibroid 12/23/2016   1 cm   . GERD (gastroesophageal reflux disease)   . Hyperlipidemia   . Migraine    with some aura    Past Surgical History:  Procedure Laterality Date  . DILATATION & CURETTAGE/HYSTEROSCOPY WITH MYOSURE N/A 04/04/2017   Procedure: DILATATION & CURETTAGE/HYSTEROSCOPY WITH MYOSURE,POLYPECTOMY;  Surgeon: Nunzio Cobbs, MD;  Location: [facility];  Service: Gynecology;  Laterality: N/A;  endometrial polyp  . MOUTH SURGERY    . NO PAST SURGERIES      Current Outpatient Medications  Medication Sig Dispense Refill  . acetaminophen (TYLENOL) 500 MG tablet Take 1,000  mg by mouth every 6 (six) hours as needed for moderate pain or headache.    . calcium carbonate (TUMS - DOSED IN MG ELEMENTAL CALCIUM) 500 MG chewable tablet Chew 1 tablet by mouth daily as needed for indigestion or heartburn.    . hydrochlorothiazide (HYDRODIURIL) 25 MG tablet Take 1 tablet (25 mg total) by mouth daily. (Patient taking differently: Take 12.5 mg by mouth daily. ) 90 tablet 0  . ibuprofen (ADVIL) 800 MG tablet Take 1 tablet (800 mg total) by mouth every 8 (eight) hours as needed. 90 tablet 1   No current facility-administered medications for this visit.    Family History  Problem Relation Age of Onset  . Hypertension Mother   . Diabetes Mother   . Stroke Mother        [address] [place]  2010  . Cancer Father     Review of Systems  All other systems reviewed and are negative.   Exam:   BP (!) 148/84   Pulse 100   Temp 97.9 F (36.6 C) (Temporal)   Resp (!) 22   Ht 5\' 5"  (1.651 m)   Wt 186 lb (84.4 kg)   LMP 04/29/2020 (Approximate)   BMI 30.95 kg/m     General appearance: alert, cooperative and appears stated age Head: normocephalic, without obvious abnormality, atraumatic Neck: no adenopathy, supple, symmetrical, trachea midline and thyroid normal to inspection and palpation  Lungs: clear to auscultation bilaterally Breasts: normal appearance, no masses or tenderness, No nipple retraction or dimpling, No nipple discharge or bleeding, No axillary adenopathy Heart: regular rate and rhythm Abdomen: soft, non-tender; no masses, no organomegaly Extremities: extremities normal, atraumatic, no cyanosis or edema Skin: skin color, texture, turgor normal. No rashes or lesions Lymph nodes: cervical, supraclavicular, and axillary nodes normal on right.  Small left supraclavicular node on left.  Neurologic: grossly normal  Pelvic: External genitalia:  no lesions              No abnormal inguinal nodes palpated.              Urethra:  normal appearing urethra with no masses,  tenderness or lesions              Bartholins and Skenes: normal                 Vagina: normal appearing vagina with normal color and discharge, no lesions              Cervix: no lesions              Pap taken: No. Bimanual Exam:  Uterus:  normal size, contour, position, consistency, mobility, non-tender              Adnexa: no mass, fullness, tenderness              Rectal exam: Yes.  .  Confirms.              Anus:  normal sphincter tone, no lesions  Chaperone was present for exam.  Assessment:   Well woman visit with normal exam. Dysmenorrhea.  Migraine with some aura.  HTN. Vaginal discharge. Left supraclavicular adenopathy post Covid vaccine.   Plan: Mammogram/US  follow up in August 2021.  Self breast awareness reviewed. Pap and HR HPV as above. Guidelines for Calcium, Vitamin D, regular exercise program including cardiovascular and weight bearing exercise. Start Micronor.  Instructed in use.  Affirm testing.  Follow up annually and prn.   After visit summary provided.

## 2020-05-02 ENCOUNTER — Other Ambulatory Visit: Payer: Self-pay

## 2020-05-05 ENCOUNTER — Encounter: Payer: Self-pay | Admitting: Obstetrics and Gynecology

## 2020-05-05 ENCOUNTER — Other Ambulatory Visit: Payer: Self-pay

## 2020-05-05 ENCOUNTER — Ambulatory Visit: Payer: 59 | Admitting: Obstetrics and Gynecology

## 2020-05-05 VITALS — BP 148/84 | HR 100 | Temp 97.9°F | Resp 22 | Ht 65.0 in | Wt 186.0 lb

## 2020-05-05 DIAGNOSIS — N898 Other specified noninflammatory disorders of vagina: Secondary | ICD-10-CM | POA: Diagnosis not present

## 2020-05-05 DIAGNOSIS — Z01419 Encounter for gynecological examination (general) (routine) without abnormal findings: Secondary | ICD-10-CM | POA: Diagnosis not present

## 2020-05-05 MED ORDER — NORETHINDRONE 0.35 MG PO TABS: 1.0000 | ORAL_TABLET | Freq: Every day | ORAL | 11 refills | Status: DC

## 2020-05-05 NOTE — Patient Instructions (Signed)

## 2020-05-06 LAB — VAGINITIS/VAGINOSIS, DNA PROBE
Candida Species: POSITIVE — AB
Gardnerella vaginalis: POSITIVE — AB
Trichomonas vaginosis: NEGATIVE

## 2020-05-08 ENCOUNTER — Telehealth: Payer: Self-pay | Admitting: Obstetrics and Gynecology

## 2020-05-08 MED ORDER — METRONIDAZOLE 0.75 % VA GEL: 1.0000 | Freq: Every day | VAGINAL | 0 refills | Status: AC

## 2020-05-08 NOTE — Telephone Encounter (Signed)
Patient returning call to Stephanie.  

## 2020-05-08 NOTE — Telephone Encounter (Signed)
Spoke with patient, advised of all results as seen below per Dr. Quincy Simmonds. Patient states she does not like to take pills. Rx sent to verified pharmacy for metrogel. ETOH precautions reviewed.  Patient will complete metrogel then treat with OTC monistat. Patient verbalzies understanding and is agreeable.   Encounter closed.

## 2020-05-08 NOTE — Telephone Encounter (Signed)
Left message to call Kimiye Strathman, RN at [facility] [phone].   

## 2020-05-08 NOTE — Telephone Encounter (Signed)
-----   Message from Nunzio Cobbs, MD sent at 05/07/2020 10:22 PM EDT ----- Please inform of Affirm result showing bacterial vaginosis and yeast. She may treat the BV with Flagyl 500 mg po bid for 7 days or Metrogel pv at hs for 5 nights.  Please send Rx to pharmacy of choice. ETOH precautions.   For the yeast, she may treat with Monistat 1, 3 or 7 over the counter medication or Diflucan 150 mg po x 1 and repeat in 72 hours prn.  Disp:  2 RF:  none.

## 2020-05-20 ENCOUNTER — Other Ambulatory Visit: Payer: Self-pay

## 2020-05-21 ENCOUNTER — Ambulatory Visit (INDEPENDENT_AMBULATORY_CARE_PROVIDER_SITE_OTHER): Payer: 59 | Admitting: Internal Medicine

## 2020-05-21 ENCOUNTER — Encounter: Payer: Self-pay | Admitting: Internal Medicine

## 2020-05-21 VITALS — BP 138/90 | HR 94 | Temp 98.1°F | Ht 64.6 in | Wt 184.4 lb

## 2020-05-21 DIAGNOSIS — E785 Hyperlipidemia, unspecified: Secondary | ICD-10-CM

## 2020-05-21 DIAGNOSIS — R030 Elevated blood-pressure reading, without diagnosis of hypertension: Secondary | ICD-10-CM

## 2020-05-21 DIAGNOSIS — Z79899 Other long term (current) drug therapy: Secondary | ICD-10-CM | POA: Diagnosis not present

## 2020-05-21 DIAGNOSIS — Z0000 Encounter for general adult medical examination without abnormal findings: Secondary | ICD-10-CM | POA: Diagnosis not present

## 2020-05-21 DIAGNOSIS — I10 Essential (primary) hypertension: Secondary | ICD-10-CM | POA: Diagnosis not present

## 2020-05-21 NOTE — Progress Notes (Signed)
Chief Complaint  Patient presents with  . Annual Exam    Doing well    HPI: Patient  Teresa Hickman  49 y.o. comes in today for Preventive Health Care visit  Gyne exam 5 21 Neck mass  [place]  eval  Casey ln reactive  Had covid vaccine  Left arm   Has had chest and abd ct scan  benign findings  To fu with surgeon in August  Area is getting better  Colon  w polyp dr mann 2020 BP taking 1/2 hctz  Readings systolic are all in range most below 973 and 532  Diastolic tends to be 99M and 90  Health Maintenance  Topic Date Due  . Hepatitis C Screening  Never done  . COVID-19 Vaccine (1) Never done  . TETANUS/TDAP  08/16/2016  . PAP SMEAR-Modifier  12/23/2019  . MAMMOGRAM  06/16/2020  . INFLUENZA VACCINE  07/13/2020  . COLONOSCOPY  12/22/2028  . HIV Screening  Completed   Health Maintenance Review LIFESTYLE:  Exercise:  Walking   Tobacco/ETS: n Alcohol:  n Sugar beverages: juice ocass  Sleep: 1130 - 5-6    Drug use: no HH of 6  Work: homemaker     ROS:  GEN/ HEENT: No fever, significant weight changes sweats headaches vision problems hearing changes, CV/ PULM; No chest pain shortness of breath cough, syncope,edema  change in exercise tolerance. GI /GU: No adominal pain, vomiting, change in bowel habits. No blood in the stool. No significant GU symptoms. SKIN/HEME: ,no acute skin rashes suspicious lesions or bleeding. No lymphadenopathy, nodules, masses.  NEURO/ PSYCH:  No neurologic signs such as weakness numbness. No depression anxiety. IMM/ Allergy: No unusual infections.  Allergy .   REST of 12 system review negative except as per HPI   Past Medical History:  Diagnosis Date  . Arthritis   . Dysmenorrhea   . Essential hypertension 12/27/2014  . Fibroid 12/23/2016   1 cm   . GERD (gastroesophageal reflux disease)   . Hyperlipidemia   . Migraine    with some aura    Past Surgical History:  Procedure Laterality Date  . DILATATION & CURETTAGE/HYSTEROSCOPY WITH MYOSURE  N/A 04/04/2017   Procedure: DILATATION & CURETTAGE/HYSTEROSCOPY WITH MYOSURE,POLYPECTOMY;  Surgeon: Nunzio Cobbs, MD;  Location: [facility];  Service: Gynecology;  Laterality: N/A;  endometrial polyp  . MOUTH SURGERY    . NO PAST SURGERIES      Family History  Problem Relation Age of Onset  . Hypertension Mother   . Diabetes Mother   . Stroke Mother        40s [place]  2010  . Cancer Father     Social History   Socioeconomic History  . Marital status: Married    Spouse name: Lawal  . Number of children: 4  . Years of education: 7  . Highest education level: Not on file  Occupational History  . Occupation: Unemployed  Tobacco Use  . Smoking status: Never Smoker  . Smokeless tobacco: Never Used  Substance and Sexual Activity  . Alcohol use: No  . Drug use: No  . Sexual activity: Yes    Partners: Male    Birth control/protection: None  Other Topics Concern  . Not on file  Social History Narrative   Lives with husband and son, [name]   From [place]   Medical Family   HH of 6    No pets   G4 p4    Caffeine  use: 1 cup coffee per day   Social Determinants of Health   Financial Resource Strain:   . Difficulty of Paying Living Expenses:   Food Insecurity:   . Worried About Charity fundraiser in the Last Year:   . Arboriculturist in the Last Year:   Transportation Needs:   . Film/video editor (Medical):   Marland Kitchen Lack of Transportation (Non-Medical):   Physical Activity:   . Days of Exercise per Week:   . Minutes of Exercise per Session:   Stress:   . Feeling of Stress :   Social Connections:   . Frequency of Communication with Friends and Family:   . Frequency of Social Gatherings with Friends and Family:   . Attends Religious Services:   . Active Member of Clubs or Organizations:   . Attends Archivist Meetings:   Marland Kitchen Marital Status:     Outpatient Medications Prior to Visit  Medication Sig Dispense Refill  . acetaminophen (TYLENOL) 500 MG  tablet Take 1,000 mg by mouth every 6 (six) hours as needed for moderate pain or headache.    . calcium carbonate (TUMS - DOSED IN MG ELEMENTAL CALCIUM) 500 MG chewable tablet Chew 1 tablet by mouth daily as needed for indigestion or heartburn.    . hydrochlorothiazide (HYDRODIURIL) 25 MG tablet Take 1 tablet (25 mg total) by mouth daily. (Patient taking differently: Take 12.5 mg by mouth daily. ) 90 tablet 0  . ibuprofen (ADVIL) 800 MG tablet Take 1 tablet (800 mg total) by mouth every 8 (eight) hours as needed. 90 tablet 1  . norethindrone (MICRONOR) 0.35 MG tablet Take 1 tablet (0.35 mg total) by mouth daily. (Patient not taking: Reported on 05/21/2020) 1 Package 11   No facility-administered medications prior to visit.     EXAM:  BP 138/90   Pulse 94   Temp 98.1 F (36.7 C) (Temporal)   Ht 5' 4.6" (1.641 m)   Wt 184 lb 6.4 oz (83.6 kg)   LMP 04/29/2020 (Approximate)   SpO2 99%   BMI 31.07 kg/m   Body mass index is 31.07 kg/m. Wt Readings from Last 3 Encounters:  05/21/20 184 lb 6.4 oz (83.6 kg)  05/05/20 186 lb (84.4 kg)  05/25/18 185 lb 8 oz (84.1 kg)    Physical Exam: Vital signs reviewed GGY:IRSW is a well-developed well-nourished alert cooperative    who appearsr stated age in no acute distress.  HEENT: normocephalic atraumatic , Eyes: PERRL EOM's full, conjunctiva clear, Nares:masked , Ears: no deformity EAC's clear TMs with normal landmarks. Mouth: masked NECK: supple without masses, thyromegaly or bruits. CHEST/PULM:  Clear to auscultation and percussion breath sounds equal no wheeze , rales or rhonchi.  Breast: normal by inspection . No dimpling, discharge, masses, tenderness or discharge . CV: PMI is nondisplaced, S1 S2 no gallops, murmurs, rubs. Peripheral pulses are full without delay.No JVD .  ABDOMEN: Bowel sounds normal nontender  No guard or rebound, no hepato splenomegal no CVA tenderness.  No hernia. Extremtities:  No clubbing cyanosis or edema, no acute  joint swelling or redness no focal atrophy NEURO:  Oriented x3, cranial nerves 3-12 appear to be intact, no obvious focal weakness,gait within normal limits no abnormal reflexes or asymmetrical SKIN: No acute rashes normal turgor, color, no bruising or petechiae. Dry skin legs  Callous knee no lesion swelling PSYCH: Oriented, good eye contact, no obvious depression anxiety, cognition and judgment appear normal. LN: no other cervical axillary inguinal  adenopathy  Left Butler are tiny nodule  Overlaying skin pigmented    Lab Results  Component Value Date   WBC 7.1 12/15/2018   HGB 12.7 12/15/2018   HCT 40 12/15/2018   PLT 342 12/15/2018   GLUCOSE 119 (H) 11/11/2017   CHOL 218 (H) 06/25/2016   TRIG 75.0 06/25/2016   HDL 51.30 06/25/2016   LDLDIRECT 176.4 10/09/2008   LDLCALC 152 (H) 06/25/2016   ALT 15 12/15/2018   AST 16 12/15/2018   NA 139 12/15/2018   K 3.5 12/15/2018   CL 103 11/11/2017   CREATININE 0.6 12/15/2018   BUN 7 12/15/2018   CO2 30 03/30/2017   TSH 1.10 01/31/2018   HGBA1C 5.6 04/25/2017    BP Readings from Last 3 Encounters:  05/21/20 138/90  05/05/20 (!) 148/84  05/25/18 (!) 150/92   Wt Readings from Last 3 Encounters:  05/21/20 184 lb 6.4 oz (83.6 kg)  05/05/20 186 lb (84.4 kg)  05/25/18 185 lb 8 oz (84.1 kg)     Lab plan  reviewed with patient   ASSESSMENT AND PLAN:  Discussed the following assessment and plan:    ICD-10-CM   1. Visit for preventive health examination  J69.67 Basic metabolic panel    CBC with Differential/Platelet    Hemoglobin A1c    Hepatic function panel    Lipid panel    TSH  2. Elevated blood pressure reading  E93.8 Basic metabolic panel    CBC with Differential/Platelet    Hemoglobin A1c    Hepatic function panel    Lipid panel    TSH  3. Essential hypertension  B01 Basic metabolic panel    CBC with Differential/Platelet    Hemoglobin A1c    Hepatic function panel    Lipid panel    TSH  4. Medication management   B51.025 Basic metabolic panel    CBC with Differential/Platelet    Hemoglobin A1c    Hepatic function panel    Lipid panel    TSH  5. Hyperlipidemia, unspecified hyperlipidemia type  E52.7 Basic metabolic panel    CBC with Differential/Platelet    Hemoglobin A1c    Hepatic function panel    Lipid panel    TSH  mostly diastolic elevation when occurs  Had hx of dizziness and low bp with 25 mg hctz will wait at this tpoint and send in readings consider adding low dose other med if needed . Fasting labs this week at [facility] td when stable issues . utd on other hcm Return for send in blood pressusre readings in 2-3 months , fasting labs  at [facility] ,cpx 1 year.  Patient Care Team: Daquann Merriott, Standley Brooking, MD as PCP - General Nunzio Cobbs, MD as Consulting Physician (Obstetrics and Gynecology) Juanita Craver, MD as Consulting Physician (Gastroenterology) Patient Instructions  send in blood pressusre readings in 2-3 months  Sty on 1/2 hctz for now  , fasting labs  at [facility] , cpx 1 year Get Td booster appt   Nursing  Visit after the swollen Ln problem is settled     Health Maintenance, Female Adopting a healthy lifestyle and getting preventive care are important in promoting health and wellness. Ask your health care provider about:  The right schedule for you to have regular tests and exams.  Things you can do on your own to prevent diseases and keep yourself healthy. What should I know about diet, weight, and exercise? Eat a healthy  diet   Eat a diet that includes plenty of vegetables, fruits, low-fat dairy products, and lean protein.  Do not eat a lot of foods that are high in solid fats, added sugars, or sodium. Maintain a healthy weight Body mass index (BMI) is used to identify weight problems. It estimates body fat based on height and weight. Your health care provider can help determine your BMI and help you achieve or maintain a healthy weight. Get regular  exercise Get regular exercise. This is one of the most important things you can do for your health. Most adults should:  Exercise for at least 150 minutes each week. The exercise should increase your heart rate and make you sweat (moderate-intensity exercise).  Do strengthening exercises at least twice a week. This is in addition to the moderate-intensity exercise.  Spend less time sitting. Even light physical activity can be beneficial. Watch cholesterol and blood lipids Have your blood tested for lipids and cholesterol at 50 years of age, then have this test every 5 years. Have your cholesterol levels checked more often if:  Your lipid or cholesterol levels are high.  You are older than 50 years of age.  You are at high risk for heart disease. What should I know about cancer screening? Depending on your health history and family history, you may need to have cancer screening at various ages. This may include screening for:  Breast cancer.  Cervical cancer.  Colorectal cancer.  Skin cancer.  Lung cancer. What should I know about heart disease, diabetes, and high blood pressure? Blood pressure and heart disease  High blood pressure causes heart disease and increases the risk of stroke. This is more likely to develop in people who have high blood pressure readings, are of African descent, or are overweight.  Have your blood pressure checked: ? Every 3-5 years if you are 81-70 years of age. ? Every year if you are 56 years old or older. Diabetes Have regular diabetes screenings. This checks your fasting blood sugar level. Have the screening done:  Once every three years after age 39 if you are at a normal weight and have a low risk for diabetes.  More often and at a younger age if you are overweight or have a high risk for diabetes. What should I know about preventing infection? Hepatitis B If you have a higher risk for hepatitis B, you should be screened for this virus.  Talk with your health care provider to find out if you are at risk for hepatitis B infection. Hepatitis C Testing is recommended for:  Everyone born from [birth year] through 1966.  Anyone with known risk factors for hepatitis C. Sexually transmitted infections (STIs)  Get screened for STIs, including gonorrhea and chlamydia, if: ? You are sexually active and are younger than 50 years of age. ? You are older than 50 years of age and your health care provider tells you that you are at risk for this type of infection. ? Your sexual activity has changed since you were last screened, and you are at increased risk for chlamydia or gonorrhea. Ask your health care provider if you are at risk.  Ask your health care provider about whether you are at high risk for HIV. Your health care provider may recommend a prescription medicine to help prevent HIV infection. If you choose to take medicine to prevent HIV, you should first get tested for HIV. You should then be tested every 3 months for as long as you  are taking the medicine. Pregnancy  If you are about to stop having your period (premenopausal) and you may become pregnant, seek counseling before you get pregnant.  Take 400 to 800 micrograms (mcg) of folic acid every day if you become pregnant.  Ask for birth control (contraception) if you want to prevent pregnancy. Osteoporosis and menopause Osteoporosis is a disease in which the bones lose minerals and strength with aging. This can result in bone fractures. If you are 24 years old or older, or if you are at risk for osteoporosis and fractures, ask your health care provider if you should:  Be screened for bone loss.  Take a calcium or vitamin D supplement to lower your risk of fractures.  Be given hormone replacement therapy (HRT) to treat symptoms of menopause. Follow these instructions at home: Lifestyle  Do not use any products that contain nicotine or tobacco, such as cigarettes, e-cigarettes,  and chewing tobacco. If you need help quitting, ask your health care provider.  Do not use street drugs.  Do not share needles.  Ask your health care provider for help if you need support or information about quitting drugs. Alcohol use  Do not drink alcohol if: ? Your health care provider tells you not to drink. ? You are pregnant, may be pregnant, or are planning to become pregnant.  If you drink alcohol: ? Limit how much you use to 0-1 drink a day. ? Limit intake if you are breastfeeding.  Be aware of how much alcohol is in your drink. In the [place] one drink equals one 12 oz bottle of beer (355 mL), one 5 oz glass of wine (148 mL), or one 1 oz glass of hard liquor (44 mL). General instructions  Schedule regular health, dental, and eye exams.  Stay current with your vaccines.  Tell your health care provider if: ? You often feel depressed. ? You have ever been abused or do not feel safe at home. Summary  Adopting a healthy lifestyle and getting preventive care are important in promoting health and wellness.  Follow your health care provider's instructions about healthy diet, exercising, and getting tested or screened for diseases.  Follow your health care provider's instructions on monitoring your cholesterol and blood pressure. This information is not intended to replace advice given to you by your health care provider. Make sure you discuss any questions you have with your health care provider. Document Revised: 11/22/2018 Document Reviewed: 11/22/2018 Elsevier Patient Education  2020 [place] Lindzie Boxx M.D.

## 2020-05-21 NOTE — Patient Instructions (Addendum)
send in blood pressusre readings in 2-3 months  Sty on 1/2 hctz for now  , fasting labs  at [facility] , cpx 1 year Get Td booster appt   Nursing  Visit after the swollen Ln problem is settled     Health Maintenance, Female Adopting a healthy lifestyle and getting preventive care are important in promoting health and wellness. Ask your health care provider about:  The right schedule for you to have regular tests and exams.  Things you can do on your own to prevent diseases and keep yourself healthy. What should I know about diet, weight, and exercise? Eat a healthy diet   Eat a diet that includes plenty of vegetables, fruits, low-fat dairy products, and lean protein.  Do not eat a lot of foods that are high in solid fats, added sugars, or sodium. Maintain a healthy weight Body mass index (BMI) is used to identify weight problems. It estimates body fat based on height and weight. Your health care provider can help determine your BMI and help you achieve or maintain a healthy weight. Get regular exercise Get regular exercise. This is one of the most important things you can do for your health. Most adults should:  Exercise for at least 150 minutes each week. The exercise should increase your heart rate and make you sweat (moderate-intensity exercise).  Do strengthening exercises at least twice a week. This is in addition to the moderate-intensity exercise.  Spend less time sitting. Even light physical activity can be beneficial. Watch cholesterol and blood lipids Have your blood tested for lipids and cholesterol at 50 years of age, then have this test every 5 years. Have your cholesterol levels checked more often if:  Your lipid or cholesterol levels are high.  You are older than 50 years of age.  You are at high risk for heart disease. What should I know about cancer screening? Depending on your health history and family history, you may need to have cancer screening at various  ages. This may include screening for:  Breast cancer.  Cervical cancer.  Colorectal cancer.  Skin cancer.  Lung cancer. What should I know about heart disease, diabetes, and high blood pressure? Blood pressure and heart disease  High blood pressure causes heart disease and increases the risk of stroke. This is more likely to develop in people who have high blood pressure readings, are of African descent, or are overweight.  Have your blood pressure checked: ? Every 3-5 years if you are 53-37 years of age. ? Every year if you are 75 years old or older. Diabetes Have regular diabetes screenings. This checks your fasting blood sugar level. Have the screening done:  Once every three years after age 10 if you are at a normal weight and have a low risk for diabetes.  More often and at a younger age if you are overweight or have a high risk for diabetes. What should I know about preventing infection? Hepatitis B If you have a higher risk for hepatitis B, you should be screened for this virus. Talk with your health care provider to find out if you are at risk for hepatitis B infection. Hepatitis C Testing is recommended for:  Everyone born from [birth year] through 1965.  Anyone with known risk factors for hepatitis C. Sexually transmitted infections (STIs)  Get screened for STIs, including gonorrhea and chlamydia, if: ? You are sexually active and are younger than 50 years of age. ? You are older than 50  years of age and your health care provider tells you that you are at risk for this type of infection. ? Your sexual activity has changed since you were last screened, and you are at increased risk for chlamydia or gonorrhea. Ask your health care provider if you are at risk.  Ask your health care provider about whether you are at high risk for HIV. Your health care provider may recommend a prescription medicine to help prevent HIV infection. If you choose to take medicine to prevent HIV, you  should first get tested for HIV. You should then be tested every 3 months for as long as you are taking the medicine. Pregnancy  If you are about to stop having your period (premenopausal) and you may become pregnant, seek counseling before you get pregnant.  Take 400 to 800 micrograms (mcg) of folic acid every day if you become pregnant.  Ask for birth control (contraception) if you want to prevent pregnancy. Osteoporosis and menopause Osteoporosis is a disease in which the bones lose minerals and strength with aging. This can result in bone fractures. If you are 12 years old or older, or if you are at risk for osteoporosis and fractures, ask your health care provider if you should:  Be screened for bone loss.  Take a calcium or vitamin D supplement to lower your risk of fractures.  Be given hormone replacement therapy (HRT) to treat symptoms of menopause. Follow these instructions at home: Lifestyle  Do not use any products that contain nicotine or tobacco, such as cigarettes, e-cigarettes, and chewing tobacco. If you need help quitting, ask your health care provider.  Do not use street drugs.  Do not share needles.  Ask your health care provider for help if you need support or information about quitting drugs. Alcohol use  Do not drink alcohol if: ? Your health care provider tells you not to drink. ? You are pregnant, may be pregnant, or are planning to become pregnant.  If you drink alcohol: ? Limit how much you use to 0-1 drink a day. ? Limit intake if you are breastfeeding.  Be aware of how much alcohol is in your drink. In the [place] one drink equals one 12 oz bottle of beer (355 mL), one 5 oz glass of wine (148 mL), or one 1 oz glass of hard liquor (44 mL). General instructions  Schedule regular health, dental, and eye exams.  Stay current with your vaccines.  Tell your health care provider if: ? You often feel depressed. ? You have ever been abused or do not feel  safe at home. Summary  Adopting a healthy lifestyle and getting preventive care are important in promoting health and wellness.  Follow your health care provider's instructions about healthy diet, exercising, and getting tested or screened for diseases.  Follow your health care provider's instructions on monitoring your cholesterol and blood pressure. This information is not intended to replace advice given to you by your health care provider. Make sure you discuss any questions you have with your health care provider. Document Revised: 11/22/2018 Document Reviewed: 11/22/2018 Elsevier Patient Education  2020 [employer].

## 2020-05-22 ENCOUNTER — Other Ambulatory Visit (INDEPENDENT_AMBULATORY_CARE_PROVIDER_SITE_OTHER): Payer: 59

## 2020-05-22 DIAGNOSIS — R030 Elevated blood-pressure reading, without diagnosis of hypertension: Secondary | ICD-10-CM | POA: Diagnosis not present

## 2020-05-22 DIAGNOSIS — E785 Hyperlipidemia, unspecified: Secondary | ICD-10-CM | POA: Diagnosis not present

## 2020-05-22 DIAGNOSIS — Z0000 Encounter for general adult medical examination without abnormal findings: Secondary | ICD-10-CM | POA: Diagnosis not present

## 2020-05-22 DIAGNOSIS — I10 Essential (primary) hypertension: Secondary | ICD-10-CM

## 2020-05-22 DIAGNOSIS — Z79899 Other long term (current) drug therapy: Secondary | ICD-10-CM

## 2020-05-22 LAB — CBC WITH DIFFERENTIAL/PLATELET
Basophils Absolute: 0.1 10*3/uL (ref 0.0–0.1)
Basophils Relative: 0.7 % (ref 0.0–3.0)
Eosinophils Absolute: 0.1 10*3/uL (ref 0.0–0.7)
Eosinophils Relative: 0.5 % (ref 0.0–5.0)
HCT: 38.7 % (ref 36.0–46.0)
Hemoglobin: 12.6 g/dL (ref 12.0–15.0)
Lymphocytes Relative: 41.8 % (ref 12.0–46.0)
Lymphs Abs: 4.1 10*3/uL — ABNORMAL HIGH (ref 0.7–4.0)
MCHC: 32.6 g/dL (ref 30.0–36.0)
MCV: 85.7 fl (ref 78.0–100.0)
Monocytes Absolute: 0.9 10*3/uL (ref 0.1–1.0)
Monocytes Relative: 8.7 % (ref 3.0–12.0)
Neutro Abs: 4.7 10*3/uL (ref 1.4–7.7)
Neutrophils Relative %: 48.3 % (ref 43.0–77.0)
Platelets: 323 10*3/uL (ref 150.0–400.0)
RBC: 4.52 Mil/uL (ref 3.87–5.11)
RDW: 14.2 % (ref 11.5–15.5)
WBC: 9.8 10*3/uL (ref 4.0–10.5)

## 2020-05-22 LAB — HEPATIC FUNCTION PANEL
ALT: 21 U/L (ref 0–35)
AST: 15 U/L (ref 0–37)
Albumin: 3.9 g/dL (ref 3.5–5.2)
Alkaline Phosphatase: 64 U/L (ref 39–117)
Bilirubin, Direct: 0.1 mg/dL (ref 0.0–0.3)
Total Bilirubin: 0.4 mg/dL (ref 0.2–1.2)
Total Protein: 7.7 g/dL (ref 6.0–8.3)

## 2020-05-22 LAB — LIPID PANEL
Cholesterol: 256 mg/dL — ABNORMAL HIGH (ref 0–200)
HDL: 53.9 mg/dL (ref >39.00)
LDL Cholesterol: 184 mg/dL — ABNORMAL HIGH (ref 0–99)
NonHDL: 202.45
Total CHOL/HDL Ratio: 5
Triglycerides: 92 mg/dL (ref 0.0–149.0)
VLDL: 18.4 mg/dL (ref 0.0–40.0)

## 2020-05-22 LAB — BASIC METABOLIC PANEL
BUN: 14 mg/dL (ref 6–23)
CO2: 32 mEq/L (ref 19–32)
Calcium: 9.4 mg/dL (ref 8.4–10.5)
Chloride: 100 mEq/L (ref 96–112)
Creatinine, Ser: 0.6 mg/dL (ref 0.40–1.20)
GFR: 127.87 mL/min (ref >60.00)
Glucose, Bld: 86 mg/dL (ref 70–99)
Potassium: 3.6 mEq/L (ref 3.5–5.1)
Sodium: 138 mEq/L (ref 135–145)

## 2020-05-22 LAB — TSH: TSH: 1.55 u[IU]/mL (ref 0.35–4.50)

## 2020-05-23 LAB — HEMOGLOBIN A1C: Hgb A1c MFr Bld: 5.8 % (ref 4.6–6.5)

## 2020-05-25 NOTE — Progress Notes (Signed)
Blood sugar is normal  Cholesterol  is back  up to 256 .Intensify lifestyle interventions. To get  level  back down.  Level is close to criteria  level to begin statin medication.  Plan fasting lipid level in 5-6 month after  wokring on diet and activity . ( dx HLD)  The 10-year ASCVD risk score Mikey Bussing DC Jr., et al., 2013) is: 6%   Values used to calculate the score:     Age: 49 years     Sex: Female     Is Non-Hispanic African American: Yes     Diabetic: No     Tobacco smoker: No     Systolic Blood Pressure: 183 mmHg     Is BP treated: Yes     HDL Cholesterol: 53.9 mg/dL     Total Cholesterol: 256 mg/dL

## 2020-05-27 ENCOUNTER — Other Ambulatory Visit: Payer: Self-pay

## 2020-05-27 DIAGNOSIS — E785 Hyperlipidemia, unspecified: Secondary | ICD-10-CM

## 2021-01-11 NOTE — Progress Notes (Signed)
Chief Complaint  Patient presents with  . Hypertension    Has been checking at home, went to dentist on Thursday and they informed her it was high/elevated.    HPI: Teresa Hickman 50 y.o. come in for follow-up of blood pressure elevation readings. Last visit  June 21 PV She has been taking low-dose HCTZ 12.5 mg and her blood pressures at home had been in the 120s over 80 ranges without side effect.  She shows me her log on the phone.  Her monitor which is not in the office today is in arm cuff. She went to the dentist and the wrist blood pressure was in the 170s.  She was a bit anxious does not know why it was out. Ate  This  Am coffee and  Bread .  Denies chest pain shortness of breath syncope her left knee is given her a problem ongoing uses a pad.  Otherwise no unusual changes. Does have some sodium in her diet cooks all on her own food.  No processed.  Jan 15 2-16  Kerry Dory 50 y.o. comes in for fu maangement of HT elevated BP   Never took the norvasc  Took 25 hygroton and had dizziness and husband took bp readings and was 110 16 range  And felt bad and she stopped after 4 days or so .   Then felt better   Readings t dentist last week 128 range  Has no other readings to give me today .   ROS: See pertinent positives and negatives per HPI.  Takes Tylenol for her knee pain.  Past Medical History:  Diagnosis Date  . Arthritis   . Dysmenorrhea   . Essential hypertension 12/27/2014  . Fibroid 12/23/2016   1 cm   . GERD (gastroesophageal reflux disease)   . Hyperlipidemia   . Migraine    with some aura    Family History  Problem Relation Age of Onset  . Hypertension Mother   . Diabetes Mother   . Stroke Mother        85s [place]  2010  . Cancer Father     Social History   Socioeconomic History  . Marital status: Married    Spouse name: Lawal  . Number of children: 4  . Years of education: 49  . Highest education level: Not on file  Occupational History  .  Occupation: Unemployed  Tobacco Use  . Smoking status: Never Smoker  . Smokeless tobacco: Never Used  Vaping Use  . Vaping Use: Never used  Substance and Sexual Activity  . Alcohol use: No  . Drug use: No  . Sexual activity: Yes    Partners: Male    Birth control/protection: None  Other Topics Concern  . Not on file  Social History Narrative   Lives with husband and son, [name]   From [place]   Medical Family   HH of 6    No pets   G4 p4    Caffeine use: 1 cup coffee per day   Social Determinants of Health   Financial Resource Strain: Not on file  Food Insecurity: Not on file  Transportation Needs: Not on file  Physical Activity: Not on file  Stress: Not on file  Social Connections: Not on file    Outpatient Medications Prior to Visit  Medication Sig Dispense Refill  . acetaminophen (TYLENOL) 500 MG tablet Take 1,000 mg by mouth every 6 (six) hours as needed for moderate pain or  headache.    . calcium carbonate (TUMS - DOSED IN MG ELEMENTAL CALCIUM) 500 MG chewable tablet Chew 1 tablet by mouth daily as needed for indigestion or heartburn.    . hydrochlorothiazide (HYDRODIURIL) 25 MG tablet Take 1 tablet (25 mg total) by mouth daily. (Patient taking differently: Take 12.5 mg by mouth daily.) 90 tablet 0  . ibuprofen (ADVIL) 800 MG tablet Take 1 tablet (800 mg total) by mouth every 8 (eight) hours as needed. (Patient not taking: Reported on 01/12/2021) 90 tablet 1  . norethindrone (MICRONOR) 0.35 MG tablet Take 1 tablet (0.35 mg total) by mouth daily. (Patient not taking: No sig reported) 1 Package 11   No facility-administered medications prior to visit.     EXAM:  BP (!) 145/80 (BP Location: Right Arm, Patient Position: Sitting, Cuff Size: Normal)   Pulse (!) 107   Temp 97.7 F (36.5 C) (Oral)   Ht 5' 4.5" (1.638 m)   Wt 197 lb 6.4 oz (89.5 kg)   SpO2 98%   BMI 33.36 kg/m   Body mass index is 33.36 kg/m. Blood pressure readings done in office.  148/78 right  regular size 146/78 large.  Left 140/80 large cuff. GENERAL: vitals reviewed and listed above, alert, oriented, appears well hydrated and in no acute distress HEENT: atraumatic, conjunctiva  clear, no obvious abnormalities on inspection of external nose and ears OP masked NECK: no obvious masses on inspection palpation  LUNGS: clear to auscultation bilaterally, no wheezes, rales or rhonchi, good air movement CV: HRRR, no clubbing cyanosis or  peripheral edema nl cap refill  MS: moves all extremities without noticeable focal  abnormality PSYCH: pleasant and cooperative, no obvious depression or anxiety Lab Results  Component Value Date   WBC 9.8 05/22/2020   HGB 12.6 05/22/2020   HCT 38.7 05/22/2020   PLT 323.0 05/22/2020   GLUCOSE 86 05/22/2020   CHOL 256 (H) 05/22/2020   TRIG 92.0 05/22/2020   HDL 53.90 05/22/2020   LDLDIRECT 176.4 10/09/2008   LDLCALC 184 (H) 05/22/2020   ALT 21 05/22/2020   AST 15 05/22/2020   NA 138 05/22/2020   K 3.6 05/22/2020   CL 100 05/22/2020   CREATININE 0.60 05/22/2020   BUN 14 05/22/2020   CO2 32 05/22/2020   TSH 1.55 05/22/2020   HGBA1C 5.8 05/22/2020   BP Readings from Last 3 Encounters:  01/12/21 (!) 145/80  05/21/20 138/90  05/05/20 (!) 148/84   Wt Readings from Last 3 Encounters:  01/12/21 197 lb 6.4 oz (89.5 kg)  05/21/20 184 lb 6.4 oz (83.6 kg)  05/05/20 186 lb (84.4 kg)   Had echo and  Spec scan in 2017 dr Mayford Knife   ASSESSMENT AND PLAN:  Discussed the following assessment and plan:  Essential hypertension - Plan: Basic metabolic panel, Basic metabolic panel  Medication management - Plan: Basic metabolic panel, Basic metabolic panel  BMI 33.0-33.9,adult Blood pressure not at goal in office discordant with home readings.  Very elevated at dentist on a wrist monitor. There is probably some element of whitecoat but we need to get data on baseline. Of note she had  dizziness on 25 mg of ctd or HCTZ in the past we will review record.   Getting more data get twice daily readings over the next 7+ days bring in monitor and readings to a visit check chemistries today. At that time we can decide on adding other medicine but would probably not add higher dose of the diuretic. Discussed increasing  potassium decreasing sodium in diet that might be helpful. Consider carvedilol or  amlodpine  Low dose offered in 2015 and never taken  -Patient advised to return or notify health care team  if  new concerns arise. Record review visit counsel an plans  32 minutes  Patient Instructions   Limit sodium  Increase potassium in diet  before adding medication  Need more data since you had  Fainting with higher dose of hctz in past .  YOu may have a version of  WC hypertension  Reading  in office today are in the 144/78 range     Blood pressure goal at home would be 120/80 range. Please bring your blood pressure cuff to next appointment Take blood pressure readings twice a day for 7+  days and record .     Take 2 -3 readings at each sitting .   Can send in readings  by My Chart.     Before checking your blood pressure make sure: You are seated and quite for 5 min before checking Feet are flat on the floor Siting in chair with your back supported straight up and down Arm resting on table or arm of chair at heart level Bladder is empty You have NOT had caffeine or tobacco within the last 30 min  [email or web address]  Dash  Eating    [email or web address]">  DASH Eating Plan DASH stands for Dietary Approaches to Stop Hypertension. The DASH eating plan is a healthy eating plan that has been shown to:  Reduce high blood pressure (hypertension).  Reduce your risk for type 2 diabetes, heart disease, and stroke.  Help with weight loss. What are tips for following this plan? Reading food labels  Check food labels for the amount of salt (sodium) per serving. Choose foods with less than 5  percent of the Daily Value of sodium. Generally, foods with less than 300 milligrams (mg) of sodium per serving fit into this eating plan.  To find whole grains, look for the word "whole" as the first word in the ingredient list. Shopping  Buy products labeled as "low-sodium" or "no salt added."  Buy fresh foods. Avoid canned foods and pre-made or frozen meals. Cooking  Avoid adding salt when cooking. Use salt-free seasonings or herbs instead of table salt or sea salt. Check with your health care provider or pharmacist before using salt substitutes.  Do not fry foods. Cook foods using healthy methods such as baking, boiling, grilling, roasting, and broiling instead.  Cook with heart-healthy oils, such as olive, canola, avocado, soybean, or sunflower oil. Meal planning  Eat a balanced diet that includes: ? 4 or more servings of fruits and 4 or more servings of vegetables each day. Try to fill one-half of your plate with fruits and vegetables. ? 6-8 servings of whole grains each day. ? Less than 6 oz (170 g) of lean meat, poultry, or fish each day. A 3-oz (85-g) serving of meat is about the same size as a deck of cards. One egg equals 1 oz (28 g). ? 2-3 servings of low-fat dairy each day. One serving is 1 cup (237 mL). ? 1 serving of nuts, seeds, or beans 5 times each week. ? 2-3 servings of heart-healthy fats. Healthy fats called omega-3 fatty acids are found in foods such as walnuts, flaxseeds, fortified milks, and eggs. These fats are also found in cold-water fish, such as sardines, salmon, and mackerel.  Limit how much you eat of: ? Canned or prepackaged foods. ?  Food that is high in trans fat, such as some fried foods. ? Food that is high in saturated fat, such as fatty meat. ? Desserts and other sweets, sugary drinks, and other foods with added sugar. ? Full-fat dairy products.  Do not salt foods before eating.  Do not eat more than 4 egg yolks a week.  Try to eat at least 2  vegetarian meals a week.  Eat more home-cooked food and less restaurant, buffet, and fast food.   Lifestyle  When eating at a restaurant, ask that your food be prepared with less salt or no salt, if possible.  If you drink alcohol: ? Limit how much you use to:  0-1 drink a day for women who are not pregnant.  0-2 drinks a day for men. ? Be aware of how much alcohol is in your drink. In the [place] one drink equals one 12 oz bottle of beer (355 mL), one 5 oz glass of wine (148 mL), or one 1 oz glass of hard liquor (44 mL). General information  Avoid eating more than 2,300 mg of salt a day. If you have hypertension, you may need to reduce your sodium intake to 1,500 mg a day.  Work with your health care provider to maintain a healthy body weight or to lose weight. Ask what an ideal weight is for you.  Get at least 30 minutes of exercise that causes your heart to beat faster (aerobic exercise) most days of the week. Activities may include walking, swimming, or biking.  Work with your health care provider or dietitian to adjust your eating plan to your individual calorie needs. What foods should I eat? Fruits All fresh, dried, or frozen fruit. Canned fruit in natural juice (without added sugar). Vegetables Fresh or frozen vegetables (raw, steamed, roasted, or grilled). Low-sodium or reduced-sodium tomato and vegetable juice. Low-sodium or reduced-sodium tomato sauce and tomato paste. Low-sodium or reduced-sodium canned vegetables. Grains Whole-grain or whole-wheat bread. Whole-grain or whole-wheat pasta. Brown rice. [place]. Bulgur. Whole-grain and low-sodium cereals. Pita bread. Low-fat, low-sodium crackers. Whole-wheat flour tortillas. Meats and other proteins Skinless chicken or [place]. Ground chicken or [place]. Pork with fat trimmed off. Fish and seafood. Egg whites. Dried beans, peas, or lentils. Unsalted nuts, nut butters, and seeds. Unsalted canned beans. Lean cuts of beef  with fat trimmed off. Low-sodium, lean precooked or cured meat, such as sausages or meat loaves. Dairy Low-fat (1%) or fat-free (skim) milk. Reduced-fat, low-fat, or fat-free cheeses. Nonfat, low-sodium ricotta or cottage cheese. Low-fat or nonfat yogurt. Low-fat, low-sodium cheese. Fats and oils Soft margarine without trans fats. Vegetable oil. Reduced-fat, low-fat, or light mayonnaise and salad dressings (reduced-sodium). Canola, safflower, olive, avocado, soybean, and sunflower oils. Avocado. Seasonings and condiments Herbs. Spices. Seasoning mixes without salt. Other foods Unsalted popcorn and pretzels. Fat-free sweets. The items listed above may not be a complete list of foods and beverages you can eat. Contact a dietitian for more information. What foods should I avoid? Fruits Canned fruit in a light or heavy syrup. Fried fruit. Fruit in cream or butter sauce. Vegetables Creamed or fried vegetables. Vegetables in a cheese sauce. Regular canned vegetables (not low-sodium or reduced-sodium). Regular canned tomato sauce and paste (not low-sodium or reduced-sodium). Regular tomato and vegetable juice (not low-sodium or reduced-sodium). Angie Fava. Olives. Grains Baked goods made with fat, such as croissants, muffins, or some breads. Dry pasta or rice meal packs. Meats and other proteins Fatty cuts of meat. Ribs. Fried meat. Berniece Salines. Redan,  salami, and other precooked or cured meats, such as sausages or meat loaves. Fat from the back of a pig (fatback). Bratwurst. Salted nuts and seeds. Canned beans with added salt. Canned or smoked fish. Whole eggs or egg yolks. Chicken or [place] with skin. Dairy Whole or 2% milk, cream, and half-and-half. Whole or full-fat cream cheese. Whole-fat or sweetened yogurt. Full-fat cheese. Nondairy creamers. Whipped toppings. Processed cheese and cheese spreads. Fats and oils Butter. Stick margarine. Lard. Shortening. Ghee. Bacon fat. Tropical oils, such as coconut,  palm kernel, or palm oil. Seasonings and condiments Onion salt, garlic salt, seasoned salt, table salt, and sea salt. Worcestershire sauce. Tartar sauce. Barbecue sauce. Teriyaki sauce. Soy sauce, including reduced-sodium. Steak sauce. Canned and packaged gravies. Fish sauce. Oyster sauce. Cocktail sauce. Store-bought horseradish. Ketchup. Mustard. Meat flavorings and tenderizers. Bouillon cubes. Hot sauces. Pre-made or packaged marinades. Pre-made or packaged taco seasonings. Relishes. Regular salad dressings. Other foods Salted popcorn and pretzels. The items listed above may not be a complete list of foods and beverages you should avoid. Contact a dietitian for more information. Where to find more information  National Heart, Lung, and Blood Institute: https://wilson-eaton.com/  American Heart Association: www.heart.org  Academy of Nutrition and Dietetics: www.eatright.[place]: www.kidney.org Summary  The DASH eating plan is a healthy eating plan that has been shown to reduce high blood pressure (hypertension). It may also reduce your risk for type 2 diabetes, heart disease, and stroke.  When on the DASH eating plan, aim to eat more fresh fruits and vegetables, whole grains, lean proteins, low-fat dairy, and heart-healthy fats.  With the DASH eating plan, you should limit salt (sodium) intake to 2,300 mg a day. If you have hypertension, you may need to reduce your sodium intake to 1,500 mg a day.  Work with your health care provider or dietitian to adjust your eating plan to your individual calorie needs. This information is not intended to replace advice given to you by your health care provider. Make sure you discuss any questions you have with your health care provider. Document Revised: 11/02/2019 Document Reviewed: 11/02/2019 Elsevier Patient Education  2021 [place] K. Dustin Burrill M.D.

## 2021-01-12 ENCOUNTER — Encounter: Payer: Self-pay | Admitting: Internal Medicine

## 2021-01-12 ENCOUNTER — Ambulatory Visit: Payer: 59 | Admitting: Internal Medicine

## 2021-01-12 ENCOUNTER — Other Ambulatory Visit: Payer: Self-pay

## 2021-01-12 VITALS — BP 145/80 | HR 107 | Temp 97.7°F | Ht 64.5 in | Wt 197.4 lb

## 2021-01-12 DIAGNOSIS — Z79899 Other long term (current) drug therapy: Secondary | ICD-10-CM | POA: Diagnosis not present

## 2021-01-12 DIAGNOSIS — I10 Essential (primary) hypertension: Secondary | ICD-10-CM | POA: Diagnosis not present

## 2021-01-12 DIAGNOSIS — Z6833 Body mass index (BMI) 33.0-33.9, adult: Secondary | ICD-10-CM | POA: Diagnosis not present

## 2021-01-12 LAB — BASIC METABOLIC PANEL
BUN: 10 mg/dL (ref 6–23)
CO2: 32 mEq/L (ref 19–32)
Calcium: 9.6 mg/dL (ref 8.4–10.5)
Chloride: 101 mEq/L (ref 96–112)
Creatinine, Ser: 0.58 mg/dL (ref 0.40–1.20)
GFR: 105.1 mL/min (ref >60.00)
Glucose, Bld: 111 mg/dL — ABNORMAL HIGH (ref 70–99)
Potassium: 3.5 mEq/L (ref 3.5–5.1)
Sodium: 139 mEq/L (ref 135–145)

## 2021-01-12 NOTE — Patient Instructions (Addendum)
Limit sodium  Increase potassium in diet  before adding medication  Need more data since you had  Fainting with higher dose of hctz in past .  YOu may have a version of  WC hypertension  Reading  in office today are in the 144/78 range     Blood pressure goal at home would be 120/80 range. Please bring your blood pressure cuff to next appointment Take blood pressure readings twice a day for 7+  days and record .     Take 2 -3 readings at each sitting .   Can send in readings  by My Chart.     Before checking your blood pressure make sure: You are seated and quite for 5 min before checking Feet are flat on the floor Siting in chair with your back supported straight up and down Arm resting on table or arm of chair at heart level Bladder is empty You have NOT had caffeine or tobacco within the last 30 min  [email or web address]  Dash  Eating    [email or web address]">  DASH Eating Plan DASH stands for Dietary Approaches to Stop Hypertension. The DASH eating plan is a healthy eating plan that has been shown to:  Reduce high blood pressure (hypertension).  Reduce your risk for type 2 diabetes, heart disease, and stroke.  Help with weight loss. What are tips for following this plan? Reading food labels  Check food labels for the amount of salt (sodium) per serving. Choose foods with less than 5 percent of the Daily Value of sodium. Generally, foods with less than 300 milligrams (mg) of sodium per serving fit into this eating plan.  To find whole grains, look for the word "whole" as the first word in the ingredient list. Shopping  Buy products labeled as "low-sodium" or "no salt added."  Buy fresh foods. Avoid canned foods and pre-made or frozen meals. Cooking  Avoid adding salt when cooking. Use salt-free seasonings or herbs instead of table salt or sea salt. Check with your health care provider or pharmacist before using salt  substitutes.  Do not fry foods. Cook foods using healthy methods such as baking, boiling, grilling, roasting, and broiling instead.  Cook with heart-healthy oils, such as olive, canola, avocado, soybean, or sunflower oil. Meal planning  Eat a balanced diet that includes: ? 4 or more servings of fruits and 4 or more servings of vegetables each day. Try to fill one-half of your plate with fruits and vegetables. ? 6-8 servings of whole grains each day. ? Less than 6 oz (170 g) of lean meat, poultry, or fish each day. A 3-oz (85-g) serving of meat is about the same size as a deck of cards. One egg equals 1 oz (28 g). ? 2-3 servings of low-fat dairy each day. One serving is 1 cup (237 mL). ? 1 serving of nuts, seeds, or beans 5 times each week. ? 2-3 servings of heart-healthy fats. Healthy fats called omega-3 fatty acids are found in foods such as walnuts, flaxseeds, fortified milks, and eggs. These fats are also found in cold-water fish, such as sardines, salmon, and mackerel.  Limit how much you eat of: ? Canned or prepackaged foods. ? Food that is high in trans fat, such as some fried foods. ? Food that is high in saturated fat, such as fatty meat. ? Desserts and other sweets, sugary drinks, and other foods with added sugar. ? Full-fat dairy products.  Do not salt foods before eating.  Do not eat  more than 4 egg yolks a week.  Try to eat at least 2 vegetarian meals a week.  Eat more home-cooked food and less restaurant, buffet, and fast food.   Lifestyle  When eating at a restaurant, ask that your food be prepared with less salt or no salt, if possible.  If you drink alcohol: ? Limit how much you use to:  0-1 drink a day for women who are not pregnant.  0-2 drinks a day for men. ? Be aware of how much alcohol is in your drink. In the [place] one drink equals one 12 oz bottle of beer (355 mL), one 5 oz glass of wine (148 mL), or one 1 oz glass of hard liquor (44 mL). General  information  Avoid eating more than 2,300 mg of salt a day. If you have hypertension, you may need to reduce your sodium intake to 1,500 mg a day.  Work with your health care provider to maintain a healthy body weight or to lose weight. Ask what an ideal weight is for you.  Get at least 30 minutes of exercise that causes your heart to beat faster (aerobic exercise) most days of the week. Activities may include walking, swimming, or biking.  Work with your health care provider or dietitian to adjust your eating plan to your individual calorie needs. What foods should I eat? Fruits All fresh, dried, or frozen fruit. Canned fruit in natural juice (without added sugar). Vegetables Fresh or frozen vegetables (raw, steamed, roasted, or grilled). Low-sodium or reduced-sodium tomato and vegetable juice. Low-sodium or reduced-sodium tomato sauce and tomato paste. Low-sodium or reduced-sodium canned vegetables. Grains Whole-grain or whole-wheat bread. Whole-grain or whole-wheat pasta. Brown rice. [place]. Bulgur. Whole-grain and low-sodium cereals. Pita bread. Low-fat, low-sodium crackers. Whole-wheat flour tortillas. Meats and other proteins Skinless chicken or [place]. Ground chicken or [place]. Pork with fat trimmed off. Fish and seafood. Egg whites. Dried beans, peas, or lentils. Unsalted nuts, nut butters, and seeds. Unsalted canned beans. Lean cuts of beef with fat trimmed off. Low-sodium, lean precooked or cured meat, such as sausages or meat loaves. Dairy Low-fat (1%) or fat-free (skim) milk. Reduced-fat, low-fat, or fat-free cheeses. Nonfat, low-sodium ricotta or cottage cheese. Low-fat or nonfat yogurt. Low-fat, low-sodium cheese. Fats and oils Soft margarine without trans fats. Vegetable oil. Reduced-fat, low-fat, or light mayonnaise and salad dressings (reduced-sodium). Canola, safflower, olive, avocado, soybean, and sunflower oils. Avocado. Seasonings and condiments Herbs. Spices.  Seasoning mixes without salt. Other foods Unsalted popcorn and pretzels. Fat-free sweets. The items listed above may not be a complete list of foods and beverages you can eat. Contact a dietitian for more information. What foods should I avoid? Fruits Canned fruit in a light or heavy syrup. Fried fruit. Fruit in cream or butter sauce. Vegetables Creamed or fried vegetables. Vegetables in a cheese sauce. Regular canned vegetables (not low-sodium or reduced-sodium). Regular canned tomato sauce and paste (not low-sodium or reduced-sodium). Regular tomato and vegetable juice (not low-sodium or reduced-sodium). Angie Fava. Olives. Grains Baked goods made with fat, such as croissants, muffins, or some breads. Dry pasta or rice meal packs. Meats and other proteins Fatty cuts of meat. Ribs. Fried meat. Berniece Salines. Bologna, salami, and other precooked or cured meats, such as sausages or meat loaves. Fat from the back of a pig (fatback). Bratwurst. Salted nuts and seeds. Canned beans with added salt. Canned or smoked fish. Whole eggs or egg yolks. Chicken or [place] with skin. Dairy Whole or 2% milk, cream, and  half-and-half. Whole or full-fat cream cheese. Whole-fat or sweetened yogurt. Full-fat cheese. Nondairy creamers. Whipped toppings. Processed cheese and cheese spreads. Fats and oils Butter. Stick margarine. Lard. Shortening. Ghee. Bacon fat. Tropical oils, such as coconut, palm kernel, or palm oil. Seasonings and condiments Onion salt, garlic salt, seasoned salt, table salt, and sea salt. Worcestershire sauce. Tartar sauce. Barbecue sauce. Teriyaki sauce. Soy sauce, including reduced-sodium. Steak sauce. Canned and packaged gravies. Fish sauce. Oyster sauce. Cocktail sauce. Store-bought horseradish. Ketchup. Mustard. Meat flavorings and tenderizers. Bouillon cubes. Hot sauces. Pre-made or packaged marinades. Pre-made or packaged taco seasonings. Relishes. Regular salad dressings. Other foods Salted popcorn  and pretzels. The items listed above may not be a complete list of foods and beverages you should avoid. Contact a dietitian for more information. Where to find more information  National Heart, Lung, and Blood Institute: https://wilson-eaton.com/  American Heart Association: www.heart.org  Academy of Nutrition and Dietetics: www.eatright.[place]: www.kidney.org Summary  The DASH eating plan is a healthy eating plan that has been shown to reduce high blood pressure (hypertension). It may also reduce your risk for type 2 diabetes, heart disease, and stroke.  When on the DASH eating plan, aim to eat more fresh fruits and vegetables, whole grains, lean proteins, low-fat dairy, and heart-healthy fats.  With the DASH eating plan, you should limit salt (sodium) intake to 2,300 mg a day. If you have hypertension, you may need to reduce your sodium intake to 1,500 mg a day.  Work with your health care provider or dietitian to adjust your eating plan to your individual calorie needs. This information is not intended to replace advice given to you by your health care provider. Make sure you discuss any questions you have with your health care provider. Document Revised: 11/02/2019 Document Reviewed: 11/02/2019 Elsevier Patient Education  2021 [employer].

## 2021-01-12 NOTE — Progress Notes (Signed)
Potassium is low normal kidney function normal keep follow-up appointment and plan as we had discussed

## 2021-01-25 NOTE — Progress Notes (Signed)
Chief Complaint  Patient presents with  . Follow-up  . Hypertension    HPI: Teresa Hickman 50 y.o. come in for FU of bp she has readings noted log in her phone.  Early on was in the 129/90 high 80s but most recently has been up to 140 150/90 range.  Does not know why. Does have some right arm pain that is related to her elbow clicks but uncertain if related. Still coping with left knee pain pads. Family history Mom had a stroke. Is not on estrogen. ROS: See pertinent positives and negatives per HPI.  Past Medical History:  Diagnosis Date  . Arthritis   . Dysmenorrhea   . Essential hypertension 12/27/2014  . Fibroid 12/23/2016   1 cm   . GERD (gastroesophageal reflux disease)   . Hyperlipidemia   . Migraine    with some aura    Family History  Problem Relation Age of Onset  . Hypertension Mother   . Diabetes Mother   . Stroke Mother        52s [place]  2010  . Cancer Father     Social History   Socioeconomic History  . Marital status: Married    Spouse name: Lawal  . Number of children: 4  . Years of education: 25  . Highest education level: Not on file  Occupational History  . Occupation: Unemployed  Tobacco Use  . Smoking status: Never Smoker  . Smokeless tobacco: Never Used  Vaping Use  . Vaping Use: Never used  Substance and Sexual Activity  . Alcohol use: No  . Drug use: No  . Sexual activity: Yes    Partners: Male    Birth control/protection: None  Other Topics Concern  . Not on file  Social History Narrative   Lives with husband and son, [name]   From [place]   Medical Family   HH of 6    No pets   G4 p4    Caffeine use: 1 cup coffee per day   Social Determinants of Health   Financial Resource Strain: Not on file  Food Insecurity: Not on file  Transportation Needs: Not on file  Physical Activity: Not on file  Stress: Not on file  Social Connections: Not on file    Outpatient Medications Prior to Visit  Medication Sig Dispense Refill   . acetaminophen (TYLENOL) 500 MG tablet Take 1,000 mg by mouth every 6 (six) hours as needed for moderate pain or headache.    . calcium carbonate (TUMS - DOSED IN MG ELEMENTAL CALCIUM) 500 MG chewable tablet Chew 1 tablet by mouth daily as needed for indigestion or heartburn.    . hydrochlorothiazide (HYDRODIURIL) 25 MG tablet Take 1 tablet (25 mg total) by mouth daily. (Patient taking differently: Take 12.5 mg by mouth daily.) 90 tablet 0  . ibuprofen (ADVIL) 800 MG tablet Take 1 tablet (800 mg total) by mouth every 8 (eight) hours as needed. (Patient not taking: No sig reported) 90 tablet 1  . norethindrone (MICRONOR) 0.35 MG tablet Take 1 tablet (0.35 mg total) by mouth daily. (Patient not taking: No sig reported) 1 Package 11   No facility-administered medications prior to visit.     EXAM:  BP (!) 158/82   Pulse (!) 105   Temp 98.1 F (36.7 C)   Ht 5' 4.49" (1.638 m)   Wt 198 lb 9.6 oz (90.1 kg)   SpO2 100%   BMI 33.58 kg/m   Body mass index  is 33.58 kg/m. 158/82   GENERAL: vitals reviewed and listed above, alert, oriented, appears well hydrated and in no acute distress HEENT: atraumatic, conjunctiva  clear, no obvious abnormalities on inspection of external nose and ears OP : masked  NECK: no obvious masses on inspection palpation   CV: HRRR, no clubbing cyanosis or  peripheral edema nl cap refill  MS: moves all extremities without noticeable focal  abnormality PSYCH: pleasant and cooperative, no obvious depression or anxiety Lab Results  Component Value Date   WBC 9.8 05/22/2020   HGB 12.6 05/22/2020   HCT 38.7 05/22/2020   PLT 323.0 05/22/2020   GLUCOSE 111 (H) 01/12/2021   CHOL 256 (H) 05/22/2020   TRIG 92.0 05/22/2020   HDL 53.90 05/22/2020   LDLDIRECT 176.4 10/09/2008   LDLCALC 184 (H) 05/22/2020   ALT 21 05/22/2020   AST 15 05/22/2020   NA 139 01/12/2021   K 3.5 01/12/2021   CL 101 01/12/2021   CREATININE 0.58 01/12/2021   BUN 10 01/12/2021   CO2 32  01/12/2021   TSH 1.55 05/22/2020   HGBA1C 5.8 05/22/2020   BP Readings from Last 3 Encounters:  01/26/21 (!) 158/82  01/12/21 (!) 145/80  05/21/20 138/90   Weight: 198 lb 9.6 oz (90.1 kg)  Wt Readings from Last 3 Encounters:  01/26/21 198 lb 9.6 oz (90.1 kg)  01/12/21 197 lb 6.4 oz (89.5 kg)  05/21/20 184 lb 6.4 oz (83.6 kg)    ASSESSMENT AND PLAN:  Discussed the following assessment and plan:  Essential hypertension  Medication management  BMI 33.0-33.9,adult Blood pressure up today again and seemed to have some white coat effect but readings at home are lower but still are up recently.  Discussed healthy weight loss that might be helpful as she has gained 14 pounds in the last 8 months.  She is perimenopausal.  Counseled about lifestyle and attempt to get the weight under control. She will work on lifestyle add amlodipine 2.5 to increase to 5 mg as indicated. Plan closer follow-up 2 months virtual or in person She will be due for fasting labs for lipids etc. around June. We will have to address her lipid profile at her yearly labs. -Patient advised to return or notify health care team  if  new concerns arise.  Patient Instructions  Add low dose amlodipine 2.5 mg per day  and  If not coming down in 2-3 weeks increase to 5 mg per day .   Healthy weight loss,  Dash eating and cutting out sodium in diet . May also help.   Plan ROV  in about 2 months 2 mos  With readings .  Virtual is ok .     Standley Brooking. Katerin Negrete M.D.

## 2021-01-26 ENCOUNTER — Ambulatory Visit: Payer: 59 | Admitting: Internal Medicine

## 2021-01-26 ENCOUNTER — Other Ambulatory Visit: Payer: Self-pay

## 2021-01-26 ENCOUNTER — Encounter: Payer: Self-pay | Admitting: Internal Medicine

## 2021-01-26 VITALS — BP 158/82 | HR 105 | Temp 98.1°F | Ht 64.49 in | Wt 198.6 lb

## 2021-01-26 DIAGNOSIS — Z79899 Other long term (current) drug therapy: Secondary | ICD-10-CM | POA: Diagnosis not present

## 2021-01-26 DIAGNOSIS — Z6833 Body mass index (BMI) 33.0-33.9, adult: Secondary | ICD-10-CM | POA: Diagnosis not present

## 2021-01-26 DIAGNOSIS — I10 Essential (primary) hypertension: Secondary | ICD-10-CM | POA: Diagnosis not present

## 2021-01-26 MED ORDER — AMLODIPINE BESYLATE 2.5 MG PO TABS: 2.5000 mg | ORAL_TABLET | Freq: Every day | ORAL | 1 refills | Status: AC

## 2021-01-26 NOTE — Patient Instructions (Signed)
Add low dose amlodipine 2.5 mg per day  and  If not coming down in 2-3 weeks increase to 5 mg per day .   Healthy weight loss,  Dash eating and cutting out sodium in diet . May also help.   Plan ROV  in about 2 months 2 mos  With readings .  Virtual is ok .

## 2021-05-29 ENCOUNTER — Other Ambulatory Visit: Payer: Self-pay

## 2021-05-31 NOTE — Progress Notes (Signed)
Chief Complaint  Patient presents with   Follow-up     HPI: Teresa Hickman 50 y.o. come in for Chronic disease management   Fu BP last visit we had blood pressure elevations not at goal.  Below is the plan from last visit Add low dose amlodipine 2.5 mg per day  and  If not coming down in 2-3 weeks increase to 5 mg per day .  Healthy weight loss,  Dash eating and cutting out sodium in diet . May also help.  Plan ROV  in about 2 months 2 mos  With readings  Since her last visit she is taking the 2.5 mg and her home blood pressures which is in our machine are really quite good and feels fine with this.  Blood pressure this morning was 125/82 previous days 119/83 114/78 122/76 and 133/85 pulse is in the 70 occasionally 90 range. She does state that when she goes to the dentist to use a wrist machine and her blood pressure can be quite high. No specific chest pain shortness of breath edema. She will be traveling to [place] for 10 days this summer.  ROS: See pertinent positives and negatives per HPI.  Past Medical History:  Diagnosis Date   Arthritis    Dysmenorrhea    Essential hypertension 12/27/2014   Fibroid 12/23/2016   1 cm    GERD (gastroesophageal reflux disease)    Hyperlipidemia    Migraine    with some aura    Family History  Problem Relation Age of Onset   Hypertension Mother    Diabetes Mother    Stroke Mother        68s [place]  82   Cancer Father     Social History   Socioeconomic History   Marital status: Married    Spouse name: Lawal   Number of children: 4   Years of education: 16   Highest education level: Not on file  Occupational History   Occupation: Unemployed  Tobacco Use   Smoking status: Never   Smokeless tobacco: Never  Vaping Use   Vaping Use: Never used  Substance and Sexual Activity   Alcohol use: No   Drug use: No   Sexual activity: Yes    Partners: Male    Birth control/protection: None  Other Topics Concern   Not on file   Social History Narrative   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     amLODipine (NORVASC) 2.5 MG tablet Take 1 tablet (2.5 mg total) by mouth daily. Increase to 5 mg per day for high blood pressure gaol 130/80. 90 tablet 1   calcium carbonate (TUMS - DOSED IN MG ELEMENTAL CALCIUM) 500 MG chewable tablet Chew 1 tablet by mouth daily as needed for indigestion or heartburn.     hydrochlorothiazide (HYDRODIURIL) 25 MG tablet Take 1  tablet (25 mg total) by mouth daily. (Patient taking differently: Take 12.5 mg by mouth daily.) 90 tablet 0   No facility-administered medications prior to visit.     EXAM:  BP (!) 154/90 (BP Location: Left Arm, Patient Position: Sitting, Cuff Size: Normal)   Pulse 100   Temp 98.2 F (36.8 C) (Oral)   Ht 5' 4.45" (1.637 m)   Wt 199 lb 6.4 oz (90.4 kg)   LMP 05/30/2021 (Exact Date)   SpO2 99%   BMI 33.75 kg/m   Body mass index is 33.75 kg/m. Wt Readings from Last 3 Encounters:  06/01/21 199 lb 6.4 oz (90.4 kg)  01/26/21 198 lb 9.6 oz (90.1 kg)  01/12/21 197 lb 6.4 oz (89.5 kg)    GENERAL: vitals reviewed and listed above, alert, oriented, appears well hydrated and in no acute distress HEENT: atraumatic, conjunctiva  clear, no obvious abnormalities on inspection of external nose and ears OP :masked  NECK: no obvious masses on inspection palpation  LUNGS: clear to auscultation bilaterally, no wheezes, rales or rhonchi, good air  movement CV: HRRR, no clubbing cyanosis or  peripheral edema nl cap refill  MS: moves all extremities without noticeable focal  abnormality PSYCH: pleasant and cooperative, no obvious depression or anxiety Lab Results  Component Value Date   WBC 9.8 05/22/2020   HGB 12.6 05/22/2020   HCT 38.7 05/22/2020   PLT 323.0 05/22/2020   GLUCOSE 111 (H) 01/12/2021   CHOL 256 (H) 05/22/2020   TRIG 92.0 05/22/2020   HDL 53.90 05/22/2020   LDLDIRECT 176.4 10/09/2008   LDLCALC 184 (H) 05/22/2020   ALT 21 05/22/2020   AST 15 05/22/2020   NA 139 01/12/2021   K 3.5 01/12/2021   CL 101 01/12/2021   CREATININE 0.58 01/12/2021   BUN 10 01/12/2021   CO2 32 01/12/2021   TSH 1.55 05/22/2020   HGBA1C 5.8 05/22/2020   BP Readings from Last 3 Encounters:  06/01/21 (!) 154/90  01/26/21 (!) 158/82  01/12/21 (!) 145/80   Blood pressure reading review.  Had bagel this morning with coffee and topping. ASSESSMENT AND PLAN:  Discussed the following assessment and plan:  Essential hypertension - Plan: Basic metabolic panel, CBC with Differential/Platelet, Hemoglobin A1c, Hepatic function panel, Lipid panel, TSH, TSH, Lipid panel, Hepatic function panel, Hemoglobin A1c, CBC with Differential/Platelet, Basic metabolic panel  Medication management - Plan: Basic metabolic panel, CBC with Differential/Platelet, Hemoglobin A1c, Hepatic function panel, Lipid panel, TSH, TSH, Lipid panel, Hepatic function panel, Hemoglobin A1c, CBC with Differential/Platelet, Basic metabolic panel  Hyperlipidemia, unspecified hyperlipidemia type - Plan: Basic metabolic panel, CBC with Differential/Platelet, Hemoglobin A1c, Hepatic function panel, Lipid panel, TSH, TSH, Lipid panel, Hepatic function panel, Hemoglobin A1c, CBC with Differential/Platelet, Basic metabolic panel  Hyperglycemia  White coat syndrome with diagnosis of hypertension Due for labs soon plan today although not fasting. Blood pressure appears to be in  control with white coat effect. Continue attempts at healthy weight loss Continue the amlodipine 2.5 mg Plan CPX in 4 to 6 months or as needed. Advise she not get her blood pressure checked at the dentist office as long as it is controlled at home and on medicine.  Of note we have been more aggressive with blood pressure meds in the past and she got hypotensive and dizzy.  Maintain at this time. -Patient advised to return or notify health care team  if  new concerns arise.  Patient Instructions  Stay on amlodipine 2.5 mg per day  Continue ocass monitoring   goal below 130/80  Let us know if not  at goal . 5 # weight loss can improv BP also .  Will notify you  of labs when available.   Plan cpx in 4-6 months or as indicated .  Have pharmacy contact us for  med refills     Standley Brooking. Jeronimo Hellberg M.D.

## 2021-06-01 ENCOUNTER — Encounter: Payer: Self-pay | Admitting: Internal Medicine

## 2021-06-01 ENCOUNTER — Other Ambulatory Visit: Payer: Self-pay

## 2021-06-01 ENCOUNTER — Ambulatory Visit: Payer: 59 | Admitting: Internal Medicine

## 2021-06-01 VITALS — BP 154/90 | HR 100 | Temp 98.2°F | Ht 64.45 in | Wt 199.4 lb

## 2021-06-01 DIAGNOSIS — R790 Abnormal level of blood mineral: Secondary | ICD-10-CM

## 2021-06-01 DIAGNOSIS — I10 Essential (primary) hypertension: Secondary | ICD-10-CM | POA: Diagnosis not present

## 2021-06-01 DIAGNOSIS — E785 Hyperlipidemia, unspecified: Secondary | ICD-10-CM | POA: Diagnosis not present

## 2021-06-01 DIAGNOSIS — Z79899 Other long term (current) drug therapy: Secondary | ICD-10-CM | POA: Diagnosis not present

## 2021-06-01 DIAGNOSIS — R739 Hyperglycemia, unspecified: Secondary | ICD-10-CM | POA: Diagnosis not present

## 2021-06-01 LAB — CBC WITH DIFFERENTIAL/PLATELET
Basophils Absolute: 0 10*3/uL (ref 0.0–0.1)
Basophils Relative: 0.3 % (ref 0.0–3.0)
Eosinophils Absolute: 0.1 10*3/uL (ref 0.0–0.7)
Eosinophils Relative: 1.4 % (ref 0.0–5.0)
HCT: 36 % (ref 36.0–46.0)
Hemoglobin: 11.8 g/dL — ABNORMAL LOW (ref 12.0–15.0)
Lymphocytes Relative: 35.3 % (ref 12.0–46.0)
Lymphs Abs: 1.8 10*3/uL (ref 0.7–4.0)
MCHC: 32.7 g/dL (ref 30.0–36.0)
MCV: 84.9 fl (ref 78.0–100.0)
Monocytes Absolute: 0.5 10*3/uL (ref 0.1–1.0)
Monocytes Relative: 10.2 % (ref 3.0–12.0)
Neutro Abs: 2.7 10*3/uL (ref 1.4–7.7)
Neutrophils Relative %: 52.8 % (ref 43.0–77.0)
Platelets: 289 10*3/uL (ref 150.0–400.0)
RBC: 4.24 Mil/uL (ref 3.87–5.11)
RDW: 14.2 % (ref 11.5–15.5)
WBC: 5 10*3/uL (ref 4.0–10.5)

## 2021-06-01 LAB — LIPID PANEL
Cholesterol: 204 mg/dL — ABNORMAL HIGH (ref 0–200)
HDL: 47.7 mg/dL (ref >39.00)
LDL Cholesterol: 126 mg/dL — ABNORMAL HIGH (ref 0–99)
NonHDL: 156.31
Total CHOL/HDL Ratio: 4
Triglycerides: 152 mg/dL — ABNORMAL HIGH (ref 0.0–149.0)
VLDL: 30.4 mg/dL (ref 0.0–40.0)

## 2021-06-01 LAB — HEPATIC FUNCTION PANEL
ALT: 14 U/L (ref 0–35)
AST: 15 U/L (ref 0–37)
Albumin: 3.8 g/dL (ref 3.5–5.2)
Alkaline Phosphatase: 71 U/L (ref 39–117)
Bilirubin, Direct: 0 mg/dL (ref 0.0–0.3)
Total Bilirubin: 0.3 mg/dL (ref 0.2–1.2)
Total Protein: 7.5 g/dL (ref 6.0–8.3)

## 2021-06-01 LAB — BASIC METABOLIC PANEL
BUN: 9 mg/dL (ref 6–23)
CO2: 29 mEq/L (ref 19–32)
Calcium: 9.4 mg/dL (ref 8.4–10.5)
Chloride: 103 mEq/L (ref 96–112)
Creatinine, Ser: 0.5 mg/dL (ref 0.40–1.20)
GFR: 108.64 mL/min (ref >60.00)
Glucose, Bld: 108 mg/dL — ABNORMAL HIGH (ref 70–99)
Potassium: 3.1 mEq/L — ABNORMAL LOW (ref 3.5–5.1)
Sodium: 140 mEq/L (ref 135–145)

## 2021-06-01 LAB — HEMOGLOBIN A1C: Hgb A1c MFr Bld: 5.8 % (ref 4.6–6.5)

## 2021-06-01 LAB — TSH: TSH: 1.29 u[IU]/mL (ref 0.35–4.50)

## 2021-06-01 NOTE — Patient Instructions (Signed)
Stay on amlodipine 2.5 mg per day   Continue ocass monitoring   goal below 130/80  Let us know if not  at goal . 5 # weight loss can improv BP also .  Will notify you  of labs when available.   Plan cpx in 4-6 months or as indicated .  Have pharmacy contact us for  med refills

## 2021-06-07 NOTE — Progress Notes (Signed)
Cholesterol is better than in past   Continue lifestyle intervention healthy eating and exercise .  Potassium is borderline low again Try taking  otc  potassium supplement ( if you prefer we can send in rx for potassium.  Confirm  are you taking the 12.5 of hctz? Which can lower potassium .Marland Kitchen  Check bmp and magnesium level in 1 month  .  Dx  low potassium  hypertension.  We may have to rearrange medication   Stay on the low dose amlodipine as we discussed

## 2021-06-08 NOTE — Addendum Note (Signed)
Addended by: Nilda Riggs on: 06/08/2021 01:37 PM   Modules accepted: Orders

## 2021-06-11 ENCOUNTER — Other Ambulatory Visit (INDEPENDENT_AMBULATORY_CARE_PROVIDER_SITE_OTHER): Payer: Self-pay | Admitting: Primary Care

## 2021-06-11 DIAGNOSIS — M1712 Unilateral primary osteoarthritis, left knee: Secondary | ICD-10-CM

## 2022-03-01 ENCOUNTER — Other Ambulatory Visit (INDEPENDENT_AMBULATORY_CARE_PROVIDER_SITE_OTHER): Payer: Self-pay | Admitting: Primary Care

## 2022-03-01 DIAGNOSIS — M1712 Unilateral primary osteoarthritis, left knee: Secondary | ICD-10-CM

## 2022-10-21 DIAGNOSIS — H524 Presbyopia: Secondary | ICD-10-CM | POA: Diagnosis not present

## 2022-10-21 DIAGNOSIS — H0288B Meibomian gland dysfunction left eye, upper and lower eyelids: Secondary | ICD-10-CM | POA: Diagnosis not present

## 2022-10-21 DIAGNOSIS — H2513 Age-related nuclear cataract, bilateral: Secondary | ICD-10-CM | POA: Diagnosis not present

## 2022-10-21 DIAGNOSIS — H35372 Puckering of macula, left eye: Secondary | ICD-10-CM | POA: Diagnosis not present

## 2022-10-21 DIAGNOSIS — H52223 Regular astigmatism, bilateral: Secondary | ICD-10-CM | POA: Diagnosis not present

## 2022-10-21 DIAGNOSIS — H0288A Meibomian gland dysfunction right eye, upper and lower eyelids: Secondary | ICD-10-CM | POA: Diagnosis not present

## 2023-04-13 NOTE — Progress Notes (Signed)
Chief Complaint  Patient presents with   Back Pain   Shoulder Pain    R side    HPI: Teresa Hickman 52 y.o. come in  w daughter for  days or so of left shoulder pain without injury  hard to lift above head  no weakness or numbness . No hx of same   L back pain comes and goes and  not too bad at this time no radiating  no weakness Reports bp is better at home no recentt taking  is taking hctz and low dose amlodipine  Last visit with me was 6 22 for arthritis if need and HT  no other sig change in health  ROS: See pertinent positives and negatives per HPI.  No cv pulm sx syncope  Past Medical History:  Diagnosis Date   Arthritis    Dysmenorrhea    Essential hypertension 12/27/2014   Fibroid 12/23/2016   1 cm    GERD (gastroesophageal reflux disease)    Hyperlipidemia    Migraine    with some aura    Family History  Problem Relation Age of Onset   Hypertension Mother    Diabetes Mother    Stroke Mother        13s [place]  36   Cancer Father     Social History   Socioeconomic History   Marital status: Married    Spouse name: Lawal   Number of children: 4   Years of education: 16   Highest education level: Not on file  Occupational History   Occupation: Unemployed  Tobacco Use   Smoking status: Never   Smokeless tobacco: Never  Vaping Use   Vaping Use: Never used  Substance and Sexual Activity   Alcohol use: No   Drug use: No   Sexual activity: Yes    Partners: Male    Birth control/protection: None  Other Topics Concern   Not on file  Social History Narrative   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     amLODipine (NORVASC) 2.5 MG tablet Take 1 tablet (2.5 mg total) by mouth daily. Increase to 5 mg per day for high blood pressure gaol 130/80. 90 tablet 1   calcium carbonate (TUMS - DOSED IN MG ELEMENTAL CALCIUM) 500 MG chewable tablet Chew 1 tablet by mouth daily as needed for indigestion or heartburn.     hydrochlorothiazide (HYDRODIURIL) 25 MG tablet Take 1 tablet (25 mg total) by mouth daily. (Patient taking differently: Take 12.5 mg by mouth daily.) 90 tablet 0   No facility-administered medications prior to visit.     EXAM:  BP 126/74 (BP Location: Right Arm)   Pulse (!) 106   Temp 98 F (36.7 C) (Oral)   Ht 5' 4.45" (1.637 m)   Wt 202 lb (91.6 kg)   LMP 02/12/2023 (Approximate)  SpO2 99%   BMI 34.19 kg/m   Body mass index is 34.19 kg/m.  GENERAL: vitals reviewed and listed above, alert, oriented, appears well hydrated and in no acute distress HEENT: atraumatic, conjunctiva  clear, no obvious abnormalities on inspection of external nose and ears  NECK: no obvious masses on inspection palpation  LUNGS: clear to auscultation bilaterally, no wheezes, rales or rhonchi, good air movement CV: HRRR, no clubbing cyanosis or  peripheral edema nl cap refill  Abdomen:  Sof,t normal bowel sounds without hepatosplenomegaly, no guarding rebound or masses no CVA tenderness MS: moves all extremities without noticeable focal  abnormality left shoulder  dec rom from pain elevation 90 degrees and int and ext rotation.   Neuro seems intact  gait nl  left knee brace  no pont tenderness PSYCH: pleasant and cooperative, no obvious depression or anxiety Lab Results  Component Value Date   WBC 6.6 04/14/2023   HGB 12.6 04/14/2023   HCT 38.8 04/14/2023   PLT 292.0 04/14/2023   GLUCOSE 136 (H) 04/14/2023   CHOL 258 (H)  04/14/2023   TRIG 76.0 04/14/2023   HDL 60.10 04/14/2023   LDLDIRECT 176.4 10/09/2008   LDLCALC 183 (H) 04/14/2023   ALT 16 04/14/2023   AST 17 04/14/2023   NA 139 04/14/2023   K 3.4 (L) 04/14/2023   CL 102 04/14/2023   CREATININE 0.59 04/14/2023   BUN 9 04/14/2023   CO2 28 04/14/2023   TSH 1.50 04/14/2023   HGBA1C 6.0 04/14/2023   BP Readings from Last 3 Encounters:  04/14/23 126/74  06/01/21 (!) 154/90  01/26/21 (!) 158/82    ASSESSMENT AND PLAN:  Discussed the following assessment and plan:  Acute pain of left shoulder - Plan: Basic metabolic panel, CBC with Differential/Platelet, Hemoglobin A1c, Hepatic function panel, Lipid panel, TSH, C-reactive protein  Back pain without radiation - Plan: Basic metabolic panel, CBC with Differential/Platelet, Hemoglobin A1c, Hepatic function panel, Lipid panel, TSH, C-reactive protein  Essential hypertension - Plan: Basic metabolic panel, CBC with Differential/Platelet, Hemoglobin A1c, Hepatic function panel, Lipid panel, TSH, C-reactive protein  Medication management - Plan: Basic metabolic panel, CBC with Differential/Platelet, Hemoglobin A1c, Hepatic function panel, Lipid panel, TSH, C-reactive protein Due for lab monitoring bp etc   Shoulder c/w bursitis tendinitis  without trauma   Disc nsaid topical   conservative care for now  aleve 2 bid if needed with caution because of renal and cv gi potential se .  If ongoing  consider  sm  other pt etc  -Patient advised to return or notify health care team  if  new concerns arise.  Patient Instructions  Back and shoulder pain seem mechanical pain.cause  Trial of aleve2 twice a day for 7-10 days and then as needed Caution with gi irritation  and can interfere with BP   control .   Lab today .   Send in readings bp  twice a day for 3-5 days to ensure controlled .   Neta Mends. Cathan Gearin M.D.

## 2023-04-14 ENCOUNTER — Encounter: Payer: Self-pay | Admitting: Internal Medicine

## 2023-04-14 ENCOUNTER — Ambulatory Visit: Payer: PRIVATE HEALTH INSURANCE | Admitting: Internal Medicine

## 2023-04-14 VITALS — BP 126/74 | HR 106 | Temp 98.0°F | Ht 64.45 in | Wt 202.0 lb

## 2023-04-14 DIAGNOSIS — I10 Essential (primary) hypertension: Secondary | ICD-10-CM | POA: Diagnosis not present

## 2023-04-14 DIAGNOSIS — M549 Dorsalgia, unspecified: Secondary | ICD-10-CM

## 2023-04-14 DIAGNOSIS — M25512 Pain in left shoulder: Secondary | ICD-10-CM | POA: Diagnosis not present

## 2023-04-14 DIAGNOSIS — Z79899 Other long term (current) drug therapy: Secondary | ICD-10-CM

## 2023-04-14 LAB — CBC WITH DIFFERENTIAL/PLATELET
Basophils Absolute: 0 10*3/uL (ref 0.0–0.1)
Basophils Relative: 0.4 % (ref 0.0–3.0)
Eosinophils Absolute: 0.1 10*3/uL (ref 0.0–0.7)
Eosinophils Relative: 1.3 % (ref 0.0–5.0)
HCT: 38.8 % (ref 36.0–46.0)
Hemoglobin: 12.6 g/dL (ref 12.0–15.0)
Lymphocytes Relative: 33.5 % (ref 12.0–46.0)
Lymphs Abs: 2.2 10*3/uL (ref 0.7–4.0)
MCHC: 32.5 g/dL (ref 30.0–36.0)
MCV: 85.4 fl (ref 78.0–100.0)
Monocytes Absolute: 0.5 10*3/uL (ref 0.1–1.0)
Monocytes Relative: 7.3 % (ref 3.0–12.0)
Neutro Abs: 3.8 10*3/uL (ref 1.4–7.7)
Neutrophils Relative %: 57.5 % (ref 43.0–77.0)
Platelets: 292 10*3/uL (ref 150.0–400.0)
RBC: 4.54 Mil/uL (ref 3.87–5.11)
RDW: 14.9 % (ref 11.5–15.5)
WBC: 6.6 10*3/uL (ref 4.0–10.5)

## 2023-04-14 LAB — LIPID PANEL
Cholesterol: 258 mg/dL — ABNORMAL HIGH (ref 0–200)
HDL: 60.1 mg/dL (ref >39.00)
LDL Cholesterol: 183 mg/dL — ABNORMAL HIGH (ref 0–99)
NonHDL: 198.13
Total CHOL/HDL Ratio: 4
Triglycerides: 76 mg/dL (ref 0.0–149.0)
VLDL: 15.2 mg/dL (ref 0.0–40.0)

## 2023-04-14 LAB — C-REACTIVE PROTEIN: CRP: <1 mg/dL (ref 0.5–20.0)

## 2023-04-14 LAB — HEPATIC FUNCTION PANEL
ALT: 16 U/L (ref 0–35)
AST: 17 U/L (ref 0–37)
Albumin: 4.1 g/dL (ref 3.5–5.2)
Alkaline Phosphatase: 85 U/L (ref 39–117)
Bilirubin, Direct: 0 mg/dL (ref 0.0–0.3)
Total Bilirubin: 0.3 mg/dL (ref 0.2–1.2)
Total Protein: 8.2 g/dL (ref 6.0–8.3)

## 2023-04-14 LAB — BASIC METABOLIC PANEL
BUN: 9 mg/dL (ref 6–23)
CO2: 28 mEq/L (ref 19–32)
Calcium: 9.5 mg/dL (ref 8.4–10.5)
Chloride: 102 mEq/L (ref 96–112)
Creatinine, Ser: 0.59 mg/dL (ref 0.40–1.20)
GFR: 103.03 mL/min (ref >60.00)
Glucose, Bld: 136 mg/dL — ABNORMAL HIGH (ref 70–99)
Potassium: 3.4 mEq/L — ABNORMAL LOW (ref 3.5–5.1)
Sodium: 139 mEq/L (ref 135–145)

## 2023-04-14 LAB — TSH: TSH: 1.5 u[IU]/mL (ref 0.35–5.50)

## 2023-04-14 LAB — HEMOGLOBIN A1C: Hgb A1c MFr Bld: 6 % (ref 4.6–6.5)

## 2023-04-14 NOTE — Patient Instructions (Signed)
Back and shoulder pain seem mechanical pain.cause  Trial of aleve2 twice a day for 7-10 days and then as needed Caution with gi irritation  and can interfere with BP   control .   Lab today .   Send in readings bp  twice a day for 3-5 days to ensure controlled .

## 2023-04-17 ENCOUNTER — Encounter: Payer: Self-pay | Admitting: Internal Medicine

## 2023-05-02 NOTE — Progress Notes (Signed)
Potassium is borderline low  random  blood sugar is elevated  but not diabetes  and cholesterol much higher than last year  . Consider medication to lower ldl (  if cannot reduce level  can also  consider getting ct calcium score self pay( 99$) to help decide) Begin potassium supplement kcl 20 meq take once  a day disp 30 refill x3  Check BMP after 2-3 weeks  for low poassium.  Plan fu viist to decide about cholesterol medication or other FU.

## 2023-05-04 ENCOUNTER — Other Ambulatory Visit: Payer: Self-pay

## 2023-05-04 DIAGNOSIS — E876 Hypokalemia: Secondary | ICD-10-CM

## 2023-05-04 MED ORDER — POTASSIUM CHLORIDE CRYS ER 20 MEQ PO TBCR: 20.0000 meq | EXTENDED_RELEASE_TABLET | Freq: Every day | ORAL | 3 refills | Status: AC

## 2025-01-15 ENCOUNTER — Encounter: Payer: Self-pay | Admitting: Internal Medicine

## 2025-01-15 ENCOUNTER — Ambulatory Visit: Payer: Self-pay | Admitting: Internal Medicine

## 2025-01-15 VITALS — BP 166/80 | HR 108 | Temp 98.1°F | Ht 64.25 in | Wt 207.2 lb

## 2025-01-15 DIAGNOSIS — I10 Essential (primary) hypertension: Secondary | ICD-10-CM | POA: Diagnosis not present

## 2025-01-15 DIAGNOSIS — Z23 Encounter for immunization: Secondary | ICD-10-CM

## 2025-01-15 DIAGNOSIS — Z0000 Encounter for general adult medical examination without abnormal findings: Secondary | ICD-10-CM | POA: Diagnosis not present

## 2025-01-15 DIAGNOSIS — Z0184 Encounter for antibody response examination: Secondary | ICD-10-CM

## 2025-01-15 DIAGNOSIS — Z79899 Other long term (current) drug therapy: Secondary | ICD-10-CM

## 2025-01-15 LAB — CBC WITH DIFFERENTIAL/PLATELET
Basophils Absolute: 0.1 10*3/uL (ref 0.0–0.1)
Basophils Relative: 1 % (ref 0.0–3.0)
Eosinophils Absolute: 0.1 10*3/uL (ref 0.0–0.7)
Eosinophils Relative: 1.4 % (ref 0.0–5.0)
HCT: 37.6 % (ref 36.0–46.0)
Hemoglobin: 12.1 g/dL (ref 12.0–15.0)
Lymphocytes Relative: 45.1 % (ref 12.0–46.0)
Lymphs Abs: 2.6 10*3/uL (ref 0.7–4.0)
MCHC: 32.2 g/dL (ref 30.0–36.0)
MCV: 84 fl (ref 78.0–100.0)
Monocytes Absolute: 0.5 10*3/uL (ref 0.1–1.0)
Monocytes Relative: 9.2 % (ref 3.0–12.0)
Neutro Abs: 2.5 10*3/uL (ref 1.4–7.7)
Neutrophils Relative %: 43.3 % (ref 43.0–77.0)
Platelets: 276 10*3/uL (ref 150.0–400.0)
RBC: 4.48 Mil/uL (ref 3.87–5.11)
RDW: 14.7 % (ref 11.5–15.5)
WBC: 5.8 10*3/uL (ref 4.0–10.5)

## 2025-01-15 LAB — HEPATIC FUNCTION PANEL
ALT: 17 U/L (ref 3–35)
AST: 21 U/L (ref 5–37)
Albumin: 4.2 g/dL (ref 3.5–5.2)
Alkaline Phosphatase: 74 U/L (ref 39–117)
Bilirubin, Direct: 0.1 mg/dL (ref 0.1–0.3)
Total Bilirubin: 0.3 mg/dL (ref 0.2–1.2)
Total Protein: 8.4 g/dL — ABNORMAL HIGH (ref 6.0–8.3)

## 2025-01-15 LAB — LIPID PANEL
Cholesterol: 238 mg/dL — ABNORMAL HIGH (ref 28–200)
HDL: 57 mg/dL
LDL Cholesterol: 167 mg/dL — ABNORMAL HIGH (ref 10–99)
NonHDL: 180.62
Total CHOL/HDL Ratio: 4
Triglycerides: 68 mg/dL (ref 10.0–149.0)
VLDL: 13.6 mg/dL (ref 0.0–40.0)

## 2025-01-15 LAB — BASIC METABOLIC PANEL WITH GFR
BUN: 10 mg/dL (ref 6–23)
CO2: 27 meq/L (ref 19–32)
Calcium: 9.6 mg/dL (ref 8.4–10.5)
Chloride: 102 meq/L (ref 96–112)
Creatinine, Ser: 0.53 mg/dL (ref 0.40–1.20)
GFR: 104.43 mL/min
Glucose, Bld: 92 mg/dL (ref 70–99)
Potassium: 3.7 meq/L (ref 3.5–5.1)
Sodium: 139 meq/L (ref 135–145)

## 2025-01-15 LAB — TSH: TSH: 1.67 u[IU]/mL (ref 0.35–5.50)

## 2025-01-15 LAB — MAGNESIUM: Magnesium: 2.2 mg/dL (ref 1.5–2.5)

## 2025-01-16 LAB — HEPATITIS B SURFACE ANTIBODY, QUANTITATIVE: Hep B S AB Quant (Post): <5 m[IU]/mL — ABNORMAL LOW

## 2025-01-17 ENCOUNTER — Ambulatory Visit: Payer: Self-pay | Admitting: Internal Medicine

## 2025-01-17 NOTE — Progress Notes (Signed)
 Cholesterol could be better   but better than last year . You could consider  adding medication  and or Intensify lifestyle interventions.  And follow up  Potassium level is normal today.  Blood sugar liver and renal function in normal range No immunity noted to Hep B   I suggest you get the HEP b vaccine at your convenience  can make a  immunization appt  here or get elsewhere.   Please make sure you send in some  updated bp readings  to document  and we can decide if med adjustment is  appropriate . The 10-year ASCVD risk score (Arnett DK, et al., 2019) is: 12.8%   Values used to calculate the score:     Age: 53 years     Clinically relevant sex: Female     Is Non-Hispanic African American: Yes     Diabetic: No     Tobacco smoker: No     Systolic Blood Pressure: 166 mmHg     Is BP treated: Yes     HDL Cholesterol: 57 mg/dL     Total Cholesterol: 238 mg/dL
